# Patient Record
Sex: Female | Born: 1950 | Hispanic: No | Smoking: Never smoker
Health system: Southern US, Community
[De-identification: ages and names within clinical notes are randomized; demographics above are authoritative.]

## PROBLEM LIST (undated history)

## (undated) DIAGNOSIS — J45909 Unspecified asthma, uncomplicated: Secondary | ICD-10-CM

## (undated) DIAGNOSIS — F32A Depression, unspecified: Secondary | ICD-10-CM

## (undated) DIAGNOSIS — M5030 Other cervical disc degeneration, unspecified cervical region: Secondary | ICD-10-CM

## (undated) DIAGNOSIS — K219 Gastro-esophageal reflux disease without esophagitis: Secondary | ICD-10-CM

## (undated) DIAGNOSIS — J449 Chronic obstructive pulmonary disease, unspecified: Secondary | ICD-10-CM

## (undated) DIAGNOSIS — D472 Monoclonal gammopathy: Secondary | ICD-10-CM

## (undated) DIAGNOSIS — M3320 Polymyositis, organ involvement unspecified: Secondary | ICD-10-CM

## (undated) DIAGNOSIS — I509 Heart failure, unspecified: Secondary | ICD-10-CM

## (undated) DIAGNOSIS — I340 Nonrheumatic mitral (valve) insufficiency: Secondary | ICD-10-CM

## (undated) DIAGNOSIS — C55 Malignant neoplasm of uterus, part unspecified: Secondary | ICD-10-CM

## (undated) DIAGNOSIS — E785 Hyperlipidemia, unspecified: Secondary | ICD-10-CM

## (undated) DIAGNOSIS — C541 Malignant neoplasm of endometrium: Secondary | ICD-10-CM

## (undated) DIAGNOSIS — J984 Other disorders of lung: Secondary | ICD-10-CM

## (undated) DIAGNOSIS — J439 Emphysema, unspecified: Secondary | ICD-10-CM

## (undated) DIAGNOSIS — R001 Bradycardia, unspecified: Secondary | ICD-10-CM

## (undated) DIAGNOSIS — I272 Pulmonary hypertension, unspecified: Secondary | ICD-10-CM

## (undated) DIAGNOSIS — F329 Major depressive disorder, single episode, unspecified: Secondary | ICD-10-CM

## (undated) DIAGNOSIS — F419 Anxiety disorder, unspecified: Secondary | ICD-10-CM

## (undated) DIAGNOSIS — I10 Essential (primary) hypertension: Secondary | ICD-10-CM

## (undated) DIAGNOSIS — N189 Chronic kidney disease, unspecified: Secondary | ICD-10-CM

## (undated) HISTORY — PX: THYMECTOMY: SHX1063

## (undated) HISTORY — PX: ABDOMINAL HYSTERECTOMY: SHX81

## (undated) HISTORY — DX: Emphysema, unspecified: J43.9

## (undated) HISTORY — PX: BREAST SURGERY: SHX581

## (undated) HISTORY — DX: Anxiety disorder, unspecified: F41.9

## (undated) HISTORY — PX: EYE SURGERY: SHX253

## (undated) HISTORY — PX: CATARACT EXTRACTION: SUR2

## (undated) HISTORY — PX: DILATION AND CURETTAGE OF UTERUS: SHX78

## (undated) HISTORY — PX: APPENDECTOMY: SHX54

## (undated) HISTORY — DX: Chronic obstructive pulmonary disease, unspecified: J44.9

## (undated) HISTORY — PX: REPLACEMENT TOTAL HIP W/  RESURFACING IMPLANTS: SUR1222

## (undated) HISTORY — PX: JOINT REPLACEMENT: SHX530

## (undated) HISTORY — DX: Chronic obstructive pulmonary disease, unspecified: J98.4

---

## 1988-05-21 HISTORY — PX: BREAST EXCISIONAL BIOPSY: SUR124

## 2004-10-23 ENCOUNTER — Ambulatory Visit: Payer: Self-pay | Admitting: Internal Medicine

## 2005-10-25 ENCOUNTER — Ambulatory Visit: Payer: Self-pay | Admitting: Internal Medicine

## 2005-12-03 ENCOUNTER — Emergency Department: Payer: Self-pay | Admitting: Emergency Medicine

## 2005-12-09 ENCOUNTER — Emergency Department: Payer: Self-pay | Admitting: Emergency Medicine

## 2005-12-12 ENCOUNTER — Emergency Department: Payer: Self-pay | Admitting: Emergency Medicine

## 2005-12-16 ENCOUNTER — Emergency Department: Payer: Self-pay | Admitting: Emergency Medicine

## 2005-12-23 ENCOUNTER — Emergency Department: Payer: Self-pay | Admitting: Emergency Medicine

## 2006-01-06 ENCOUNTER — Emergency Department: Payer: Self-pay | Admitting: Emergency Medicine

## 2006-03-18 ENCOUNTER — Ambulatory Visit: Payer: Self-pay | Admitting: Gastroenterology

## 2006-10-29 ENCOUNTER — Ambulatory Visit: Payer: Self-pay | Admitting: Internal Medicine

## 2007-06-11 ENCOUNTER — Inpatient Hospital Stay: Payer: Self-pay | Admitting: Specialist

## 2007-06-11 ENCOUNTER — Other Ambulatory Visit: Payer: Self-pay

## 2007-07-09 ENCOUNTER — Ambulatory Visit: Payer: Self-pay | Admitting: Ophthalmology

## 2007-07-14 ENCOUNTER — Ambulatory Visit: Payer: Self-pay | Admitting: Ophthalmology

## 2007-09-30 ENCOUNTER — Ambulatory Visit: Payer: Self-pay | Admitting: Specialist

## 2007-11-10 ENCOUNTER — Ambulatory Visit: Payer: Self-pay | Admitting: Internal Medicine

## 2008-04-19 ENCOUNTER — Ambulatory Visit: Payer: Self-pay | Admitting: Specialist

## 2008-06-16 ENCOUNTER — Ambulatory Visit: Payer: Self-pay | Admitting: Internal Medicine

## 2008-12-06 ENCOUNTER — Ambulatory Visit: Payer: Self-pay | Admitting: Internal Medicine

## 2009-02-25 ENCOUNTER — Ambulatory Visit: Payer: Self-pay | Admitting: Specialist

## 2009-03-11 ENCOUNTER — Emergency Department: Payer: Self-pay | Admitting: Emergency Medicine

## 2009-08-29 ENCOUNTER — Emergency Department: Payer: Self-pay | Admitting: Emergency Medicine

## 2009-10-07 ENCOUNTER — Ambulatory Visit: Payer: Self-pay | Admitting: Ophthalmology

## 2009-10-24 ENCOUNTER — Ambulatory Visit: Payer: Self-pay | Admitting: Ophthalmology

## 2010-02-23 ENCOUNTER — Ambulatory Visit: Payer: Self-pay | Admitting: Internal Medicine

## 2010-03-24 ENCOUNTER — Ambulatory Visit: Payer: Self-pay | Admitting: Specialist

## 2010-04-17 ENCOUNTER — Ambulatory Visit: Payer: Self-pay | Admitting: Specialist

## 2010-05-05 ENCOUNTER — Ambulatory Visit: Payer: Self-pay | Admitting: Specialist

## 2011-03-01 ENCOUNTER — Ambulatory Visit: Payer: Self-pay | Admitting: Internal Medicine

## 2011-03-13 ENCOUNTER — Ambulatory Visit: Payer: Self-pay | Admitting: Internal Medicine

## 2011-04-16 ENCOUNTER — Ambulatory Visit: Payer: Self-pay | Admitting: Surgery

## 2011-04-16 DIAGNOSIS — I10 Essential (primary) hypertension: Secondary | ICD-10-CM

## 2011-05-19 ENCOUNTER — Ambulatory Visit: Payer: Self-pay | Admitting: Neurology

## 2011-07-23 ENCOUNTER — Ambulatory Visit: Payer: Self-pay | Admitting: Surgery

## 2011-08-08 ENCOUNTER — Emergency Department: Payer: Self-pay | Admitting: *Deleted

## 2011-09-05 ENCOUNTER — Emergency Department: Payer: Self-pay | Admitting: Emergency Medicine

## 2011-09-11 ENCOUNTER — Ambulatory Visit: Payer: Self-pay | Admitting: Gastroenterology

## 2011-09-13 ENCOUNTER — Ambulatory Visit: Payer: Self-pay | Admitting: Surgery

## 2011-09-13 LAB — PATHOLOGY REPORT

## 2011-11-17 ENCOUNTER — Emergency Department: Payer: Self-pay | Admitting: Emergency Medicine

## 2011-11-17 LAB — PROTIME-INR
INR: 1
Prothrombin Time: 13.9 secs (ref 11.5–14.7)

## 2011-11-17 LAB — BASIC METABOLIC PANEL
Anion Gap: 9 (ref 7–16)
EGFR (African American): 38 — ABNORMAL LOW
Potassium: 4.8 mmol/L (ref 3.5–5.1)
Sodium: 139 mmol/L (ref 136–145)

## 2011-11-17 LAB — CBC
HCT: 35.8 % (ref 35.0–47.0)
HGB: 12.1 g/dL (ref 12.0–16.0)
MCH: 33.8 pg (ref 26.0–34.0)
MCHC: 33.7 g/dL (ref 32.0–36.0)
MCV: 101 fL — ABNORMAL HIGH (ref 80–100)
RBC: 3.56 10*6/uL — ABNORMAL LOW (ref 3.80–5.20)
RDW: 13 % (ref 11.5–14.5)

## 2011-11-17 LAB — TROPONIN I: Troponin-I: 0.03 ng/mL

## 2011-11-22 ENCOUNTER — Emergency Department: Payer: Self-pay | Admitting: Emergency Medicine

## 2012-03-02 ENCOUNTER — Emergency Department: Payer: Self-pay | Admitting: Emergency Medicine

## 2012-03-02 LAB — BASIC METABOLIC PANEL
Anion Gap: 9 (ref 7–16)
Calcium, Total: 10 mg/dL (ref 8.5–10.1)
Chloride: 104 mmol/L (ref 98–107)
Creatinine: 1.14 mg/dL (ref 0.60–1.30)
EGFR (Non-African Amer.): 52 — ABNORMAL LOW
Osmolality: 287 (ref 275–301)
Potassium: 4.5 mmol/L (ref 3.5–5.1)

## 2012-03-02 LAB — URINALYSIS, COMPLETE
Bilirubin,UR: NEGATIVE
Blood: NEGATIVE
Ketone: NEGATIVE
Ph: 7 (ref 4.5–8.0)
Squamous Epithelial: 8
WBC UR: 14 /HPF (ref 0–5)

## 2012-03-02 LAB — CBC WITH DIFFERENTIAL/PLATELET
Basophil #: 0.1 10*3/uL (ref 0.0–0.1)
Eosinophil #: 0.2 10*3/uL (ref 0.0–0.7)
Eosinophil %: 2 %
Lymphocyte #: 2.3 10*3/uL (ref 1.0–3.6)
Lymphocyte %: 28.8 %
MCH: 33.4 pg (ref 26.0–34.0)
Monocyte #: 0.7 x10 3/mm (ref 0.2–0.9)
Neutrophil #: 4.8 10*3/uL (ref 1.4–6.5)
Neutrophil %: 59.6 %
Platelet: 205 10*3/uL (ref 150–440)
RBC: 3.86 10*6/uL (ref 3.80–5.20)
WBC: 8 10*3/uL (ref 3.6–11.0)

## 2012-03-02 LAB — SEDIMENTATION RATE: Erythrocyte Sed Rate: 46 mm/hr — ABNORMAL HIGH (ref 0–30)

## 2012-03-11 ENCOUNTER — Emergency Department: Payer: Self-pay | Admitting: Emergency Medicine

## 2012-03-11 LAB — CBC
HCT: 42.9 %
HGB: 14.2 g/dL
MCH: 33.5 pg
MCHC: 33.1 g/dL
MCV: 101 fL — ABNORMAL HIGH
Platelet: 211 10*3/uL
RBC: 4.24 X10 6/mm 3
RDW: 13.1 %
WBC: 10.9 10*3/uL

## 2012-03-11 LAB — URINALYSIS, COMPLETE
Bilirubin,UR: NEGATIVE
Blood: NEGATIVE
Glucose,UR: NEGATIVE mg/dL (ref 0–75)
Nitrite: NEGATIVE
Ph: 5 (ref 4.5–8.0)
RBC,UR: <1 /HPF (ref 0–5)
Squamous Epithelial: 9

## 2012-03-11 LAB — COMPREHENSIVE METABOLIC PANEL
Albumin: 4.2 g/dL (ref 3.4–5.0)
Anion Gap: 7 (ref 7–16)
BUN: 24 mg/dL — ABNORMAL HIGH (ref 7–18)
Calcium, Total: 9.2 mg/dL (ref 8.5–10.1)
Co2: 28 mmol/L (ref 21–32)
EGFR (African American): 53 — ABNORMAL LOW
EGFR (Non-African Amer.): 46 — ABNORMAL LOW
Glucose: 104 mg/dL — ABNORMAL HIGH (ref 65–99)
Osmolality: 284 (ref 275–301)
Potassium: 3.8 mmol/L (ref 3.5–5.1)
SGOT(AST): 35 U/L (ref 15–37)
Sodium: 140 mmol/L (ref 136–145)
Total Protein: 8.7 g/dL — ABNORMAL HIGH (ref 6.4–8.2)

## 2012-03-11 LAB — LIPASE, BLOOD: Lipase: 230 U/L

## 2012-04-10 ENCOUNTER — Ambulatory Visit: Payer: Self-pay | Admitting: Internal Medicine

## 2012-05-07 ENCOUNTER — Ambulatory Visit: Payer: Self-pay | Admitting: Internal Medicine

## 2012-12-08 ENCOUNTER — Ambulatory Visit: Payer: Self-pay | Admitting: Gastroenterology

## 2012-12-08 LAB — BASIC METABOLIC PANEL
Anion Gap: 3 — ABNORMAL LOW (ref 7–16)
BUN: 18 mg/dL (ref 7–18)
Chloride: 103 mmol/L (ref 98–107)
Creatinine: 1.15 mg/dL (ref 0.60–1.30)
EGFR (African American): 59 — ABNORMAL LOW
Glucose: 91 mg/dL (ref 65–99)
Osmolality: 279 (ref 275–301)
Potassium: 4.3 mmol/L (ref 3.5–5.1)
Sodium: 139 mmol/L (ref 136–145)

## 2012-12-08 LAB — MAGNESIUM: Magnesium: 2.2 mg/dL

## 2012-12-09 LAB — PATHOLOGY REPORT

## 2013-02-15 ENCOUNTER — Emergency Department: Payer: Self-pay | Admitting: Emergency Medicine

## 2013-06-15 ENCOUNTER — Ambulatory Visit: Payer: Self-pay | Admitting: Internal Medicine

## 2013-06-15 ENCOUNTER — Ambulatory Visit: Payer: Self-pay | Admitting: Specialist

## 2013-10-11 ENCOUNTER — Emergency Department: Payer: Self-pay | Admitting: Emergency Medicine

## 2013-10-13 ENCOUNTER — Emergency Department: Payer: Self-pay | Admitting: Emergency Medicine

## 2013-10-13 LAB — BASIC METABOLIC PANEL
ANION GAP: 5 — AB (ref 7–16)
BUN: 37 mg/dL — AB (ref 7–18)
CHLORIDE: 108 mmol/L — AB (ref 98–107)
CREATININE: 1.52 mg/dL — AB (ref 0.60–1.30)
Calcium, Total: 8.9 mg/dL (ref 8.5–10.1)
Co2: 27 mmol/L (ref 21–32)
EGFR (African American): 42 — ABNORMAL LOW
GFR CALC NON AF AMER: 36 — AB
Glucose: 107 mg/dL — ABNORMAL HIGH (ref 65–99)
OSMOLALITY: 289 (ref 275–301)
Potassium: 4.7 mmol/L (ref 3.5–5.1)
Sodium: 140 mmol/L (ref 136–145)

## 2013-10-13 LAB — CBC
HCT: 39.5 % (ref 35.0–47.0)
HGB: 12.9 g/dL (ref 12.0–16.0)
MCH: 33 pg (ref 26.0–34.0)
MCHC: 32.7 g/dL (ref 32.0–36.0)
MCV: 101 fL — ABNORMAL HIGH (ref 80–100)
Platelet: 162 10*3/uL (ref 150–440)
RBC: 3.91 10*6/uL (ref 3.80–5.20)
RDW: 13.5 % (ref 11.5–14.5)
WBC: 8.6 10*3/uL (ref 3.6–11.0)

## 2013-10-13 LAB — TROPONIN I

## 2013-10-21 DIAGNOSIS — I272 Pulmonary hypertension, unspecified: Secondary | ICD-10-CM | POA: Insufficient documentation

## 2014-01-26 ENCOUNTER — Emergency Department: Payer: Self-pay | Admitting: Emergency Medicine

## 2014-01-26 LAB — CBC
HCT: 39.7 % (ref 35.0–47.0)
HGB: 13.1 g/dL (ref 12.0–16.0)
MCH: 33.2 pg (ref 26.0–34.0)
MCHC: 33 g/dL (ref 32.0–36.0)
MCV: 101 fL — ABNORMAL HIGH (ref 80–100)
Platelet: 182 10*3/uL (ref 150–440)
RBC: 3.95 10*6/uL (ref 3.80–5.20)
RDW: 12.7 % (ref 11.5–14.5)
WBC: 7.4 10*3/uL (ref 3.6–11.0)

## 2014-01-26 LAB — BASIC METABOLIC PANEL
Anion Gap: 6 — ABNORMAL LOW (ref 7–16)
BUN: 22 mg/dL — ABNORMAL HIGH (ref 7–18)
CO2: 27 mmol/L (ref 21–32)
Calcium, Total: 9.1 mg/dL (ref 8.5–10.1)
Chloride: 107 mmol/L (ref 98–107)
Creatinine: 1.11 mg/dL (ref 0.60–1.30)
GFR CALC NON AF AMER: 53 — AB
Glucose: 85 mg/dL (ref 65–99)
OSMOLALITY: 282 (ref 275–301)
Potassium: 3.8 mmol/L (ref 3.5–5.1)
Sodium: 140 mmol/L (ref 136–145)

## 2014-01-26 LAB — TROPONIN I
TROPONIN-I: 0.03 ng/mL
Troponin-I: 0.02 ng/mL

## 2014-03-29 DIAGNOSIS — E782 Mixed hyperlipidemia: Secondary | ICD-10-CM | POA: Insufficient documentation

## 2014-03-29 DIAGNOSIS — M5030 Other cervical disc degeneration, unspecified cervical region: Secondary | ICD-10-CM | POA: Insufficient documentation

## 2014-03-29 DIAGNOSIS — J45909 Unspecified asthma, uncomplicated: Secondary | ICD-10-CM | POA: Insufficient documentation

## 2014-03-29 DIAGNOSIS — D472 Monoclonal gammopathy: Secondary | ICD-10-CM | POA: Insufficient documentation

## 2014-04-05 DIAGNOSIS — M1990 Unspecified osteoarthritis, unspecified site: Secondary | ICD-10-CM | POA: Insufficient documentation

## 2014-09-08 DIAGNOSIS — E559 Vitamin D deficiency, unspecified: Secondary | ICD-10-CM | POA: Insufficient documentation

## 2014-10-01 DIAGNOSIS — I10 Essential (primary) hypertension: Secondary | ICD-10-CM | POA: Insufficient documentation

## 2014-10-07 ENCOUNTER — Other Ambulatory Visit: Payer: Self-pay | Admitting: Internal Medicine

## 2014-10-07 DIAGNOSIS — Z1231 Encounter for screening mammogram for malignant neoplasm of breast: Secondary | ICD-10-CM

## 2014-10-08 ENCOUNTER — Ambulatory Visit: Payer: Medicare Other

## 2014-10-22 ENCOUNTER — Ambulatory Visit
Admission: RE | Admit: 2014-10-22 | Discharge: 2014-10-22 | Disposition: A | Discharge status: Home / Self Care | Payer: Medicare Other | Source: Ambulatory Visit | Attending: Internal Medicine | Admitting: Internal Medicine

## 2014-10-22 ENCOUNTER — Other Ambulatory Visit: Payer: Self-pay | Admitting: Internal Medicine

## 2014-10-22 DIAGNOSIS — Z1231 Encounter for screening mammogram for malignant neoplasm of breast: Secondary | ICD-10-CM | POA: Diagnosis present

## 2014-10-26 ENCOUNTER — Ambulatory Visit: Payer: Medicare Other

## 2015-02-21 ENCOUNTER — Ambulatory Visit: Payer: Medicare Other | Attending: Specialist

## 2015-02-21 DIAGNOSIS — E669 Obesity, unspecified: Secondary | ICD-10-CM | POA: Diagnosis present

## 2015-02-21 DIAGNOSIS — G4761 Periodic limb movement disorder: Secondary | ICD-10-CM | POA: Insufficient documentation

## 2015-02-21 DIAGNOSIS — R0683 Snoring: Secondary | ICD-10-CM | POA: Insufficient documentation

## 2015-03-21 DIAGNOSIS — Z79899 Other long term (current) drug therapy: Secondary | ICD-10-CM | POA: Insufficient documentation

## 2015-04-18 DIAGNOSIS — I5022 Chronic systolic (congestive) heart failure: Secondary | ICD-10-CM | POA: Insufficient documentation

## 2015-05-24 DIAGNOSIS — I340 Nonrheumatic mitral (valve) insufficiency: Secondary | ICD-10-CM | POA: Insufficient documentation

## 2015-09-19 DIAGNOSIS — D126 Benign neoplasm of colon, unspecified: Secondary | ICD-10-CM | POA: Insufficient documentation

## 2015-09-23 DIAGNOSIS — K219 Gastro-esophageal reflux disease without esophagitis: Secondary | ICD-10-CM | POA: Insufficient documentation

## 2015-10-28 ENCOUNTER — Encounter: Payer: Self-pay | Admitting: *Deleted

## 2015-10-28 ENCOUNTER — Emergency Department
Admission: EM | Admit: 2015-10-28 | Discharge: 2015-10-28 | Disposition: A | Discharge status: Home / Self Care | Payer: Commercial Managed Care - HMO | Attending: Emergency Medicine | Admitting: Emergency Medicine

## 2015-10-28 DIAGNOSIS — T63001A Toxic effect of unspecified snake venom, accidental (unintentional), initial encounter: Secondary | ICD-10-CM | POA: Insufficient documentation

## 2015-10-28 DIAGNOSIS — I110 Hypertensive heart disease with heart failure: Secondary | ICD-10-CM | POA: Insufficient documentation

## 2015-10-28 DIAGNOSIS — I509 Heart failure, unspecified: Secondary | ICD-10-CM | POA: Diagnosis not present

## 2015-10-28 MED ORDER — TETANUS-DIPHTH-ACELL PERTUSSIS 5-2.5-18.5 LF-MCG/0.5 IM SUSP
0.5000 mL | Freq: Once | INTRAMUSCULAR | Status: AC
Start: 2015-10-28 — End: 2015-10-28
  Administered 2015-10-28: 0.5 mL via INTRAMUSCULAR
  Filled 2015-10-28: qty 0.5

## 2015-10-28 MED ORDER — CEPHALEXIN 500 MG PO CAPS: 500.0000 mg | ORAL_CAPSULE | Freq: Four times a day (QID) | ORAL | Status: DC

## 2015-10-28 MED ORDER — CEPHALEXIN 500 MG PO CAPS
500.0000 mg | ORAL_CAPSULE | Freq: Once | ORAL | Status: AC
  Administered 2015-10-28: 500 mg via ORAL
  Filled 2015-10-28: qty 1

## 2015-10-28 NOTE — Discharge Instructions (Signed)
Snake Bite Snakes may be poisonous (venomous) or nonpoisonous (nonvenomous). A bite from a nonvenomous snake may cause a wound to the skin and possibly to the deeper tissues beneath the skin. A venomous snake will cause a wound and may also inject poison (venom) into the wound.  The effects of snake venom vary depending on the type of snake. In some cases, the effects can be extremely serious or even deadly. A bite from a venomous snake is a medical emergency. Treatment may require the use of antivenom medicine. SYMPTOMS  Symptoms of a snake bite vary depending on the type of snake, whether the snake is venomous, and the severity of the bite. Symptoms for both a venomous or nonvenomous snake may include:   Pain, redness, and swelling at the site of the bite.  Skin discoloration at the site of the bite.   A feeling of nervousness. Symptoms of a venomous snake bite may also include:   Increasing pain and swelling.  Severe anxiety or confusion.  Blood blisters or purple spots in the bite area.   Nausea and vomiting.   Numbness or tingling.   Muscle weakness.   Excessive fatigue or drowsiness.  Excessive sweating.   Difficulty breathing.   Blurred vision.   Bruising and bleeding at the site of the bite.  Feeling faint or light-headed. In some cases, symptoms do not develop until a few hours after the bite.  DIAGNOSIS  This condition may be diagnosed based on symptoms and a physical exam. Your health care provider will examine the bite area and ask for details about the snake to help determine whether it is venomous. You may also have tests, including blood tests. TREATMENT  Treatment depends on the severity of the bite and whether the snake is venomous.  Treatment for nonvenomous snake bites may involve basic wound care. This often includes cleaning the wound and applying a bandage (dressing). In some cases, antibiotic medicine or a tetanus shot may be  given.  Treatment for venomous snake bites may include antivenom medicine in addition to wound care. This medicine needs to be given as soon as possible after the bite. Other treatments may be needed to help control symptoms as they develop. You may need to stay in a hospital so your condition can be monitored. HOME CARE INSTRUCTIONS  Wound Care  Follow instructions from your health care provider about how to take care of your wound. Make sure you:   Wash your hands with soap and water before you change your dressing. If soap and water are not available, use hand sanitizer.  Change your dressing as told by your health care provider.  Keep the bite area clean and dry. Wash the bite area daily with soap and water or an antiseptic as told by your health care provider.  Check your wound every day for signs of infection. Watch for:   Redness, swelling, or pain that is getting worse.   Fluid, blood, or pus.   If you develop blistering at the site of the bite, protect the blisters from breaking. Do not attempt to open a blister. Medicines  Take or apply over-the-counter and prescription medicines only as told by your health care provider.   If you were prescribed an antibiotic, take or apply it as told by your health care provider. Do not stop using the antibiotic even if your condition improves. General Instructions  Keep the affected area raised (elevated) above the level of your heart while you are sitting  or lying down, if possible.  Keep all follow-up visits as told by your health care provider. This is important. SEEK MEDICAL CARE IF:   You have increased redness, swelling, or pain at the site of your wound.  You have fluid, blood, or pus coming from your wound.  You have a fever. SEEK IMMEDIATE MEDICAL CARE IF:   You develop blood blisters or purple spots in the bite area.   You have nausea or vomiting.   You have numbness or tingling.   You have excessive  sweating.   You have trouble breathing.   You have vision problems.   You feel very confused.  You feel faint or light-headed.    This information is not intended to replace advice given to you by your health care provider. Make sure you discuss any questions you have with your health care provider.   Document Released: 05/04/2000 Document Revised: 01/26/2015 Document Reviewed: 09/22/2014 Elsevier Interactive Patient Education [employer].

## 2015-10-28 NOTE — ED Notes (Signed)
Pt ambulatory to triage room 4.  Pt refused wheelchair.  Pt states she thinks she was bitten by a snake 4 days ago on her right leg.  2 small puncture wounds noted below right knee.  Scabbing noted to area.  No swelling noted   No redness noted.

## 2015-10-28 NOTE — ED Provider Notes (Signed)
[facility] Emergency Department Provider Note  ____________________________________________  Time seen: Approximately 5:54 PM  I have reviewed the triage vital signs and the nursing notes.   HISTORY  Chief Complaint Snake Bite    HPI Anna Romero is a 65 y.o. female presents that she believes she was potentially bit on the right lower leg by a snake in her bed. She did not see a steak, but reports that she has many snakes around and occasionally within the home. She denies any known baths within the home, and she denies any other animals in the area. She does report there are some spiders in the home, and she is not sure what bit her. The area on the leg did swell up and got red but went down yesterday and the skin now appears more normal but she reports there is a small area that looks like a bite wound to the right upper leg.  She currently reports no pain swelling nausea vomiting fevers muscle aches headache or other symptoms. She went to [employer] today, and reports that the bite looks somewhat like some of the bites seen after snakes that she was researching and thinks this may have been a snake that bit her.   No past medical history on file.  There are no active problems to display for this patient.   Past Surgical History  Procedure Laterality Date  . Breast biopsy Left 1990    negative    No current outpatient prescriptions on file.  Allergies Review of patient's allergies indicates no known allergies.  Family History  Problem Relation Age of Onset  . Adopted: Yes    Social History Social History  Substance Use Topics  . Smoking status: Never Smoker   . Smokeless tobacco: Not on file  . Alcohol Use: No    Review of Systems Constitutional: No fever/chills Eyes: No visual changes. ENT: No sore throat. Cardiovascular: Denies chest pain. Respiratory: Denies shortness of breath. Gastrointestinal: No abdominal pain.  No nausea,  no vomiting.  No diarrhea.  No constipation. Genitourinary: Negative for dysuria. Musculoskeletal: Negative for back pain. Skin: Negative for rash Except as noted. Neurological: Negative for headaches, focal weakness or numbness.  10-point ROS otherwise negative.  ____________________________________________   PHYSICAL EXAM:  VITAL SIGNS: ED Triage Vitals  Enc Vitals Group     BP 10/28/15 1729 184/56 mmHg     Pulse Rate 10/28/15 1729 66     Resp 10/28/15 1729 20     Temp 10/28/15 1729 98 F (36.7 C)     Temp Source 10/28/15 1729 Oral     SpO2 10/28/15 1729 97 %     Weight 10/28/15 1729 180 lb (81.647 kg)     Height 10/28/15 1729 5\' 6"  (1.676 m)     Head Cir --      Peak Flow --      Pain Score --      Pain Loc --      Pain Edu? --      Excl. in [place]? --    Constitutional: Alert and oriented. Well appearing and in no acute distress. Eyes: Conjunctivae are normal. PERRL. EOMI. Head: Atraumatic. Nose: No congestion/rhinnorhea. Mouth/Throat: Mucous membranes are moist.  Oropharynx non-erythematous. Neck: No stridor.   Cardiovascular: Normal rate, regular rhythm. Grossly normal heart sounds.  Good peripheral circulation. Respiratory: Normal respiratory effort.  No retractions. Lungs CTAB. Gastrointestinal: Soft and nontender. No distention. No abdominal bruits. No CVA tenderness. Musculoskeletal: No  lower extremity tenderness nor edema.  No joint effusions.Normal dorsalis pedis pulses bilateral. No evidence of rash, except for a small area with 2 very small punctate wounds involving the right upper leg with a tiny hint of erythema extending less than a centimeters around. There is no induration, fluctuance, or evidence of abscess. The leg itself shows no edema in all compartments are soft above and below the right knee. The right knee demonstrates no effusion tenderness or erythema. She is able to walk with normal gait and no pain. Neurologic:  Normal speech and language. No gross  focal neurologic deficits are appreciated. No gait instability. Skin:  Skin is warm, dry and intact. No rash noted. Psychiatric: Mood and affect are normal. Speech and behavior are normal.  ____________________________________________   LABS (all labs ordered are listed, but only abnormal results are displayed)  Labs Reviewed - No data to display ____________________________________________  EKG   ____________________________________________  RADIOLOGY   ____________________________________________   PROCEDURES  Procedure(s) performed: None  Critical Care performed: No  ____________________________________________   INITIAL IMPRESSION / ASSESSMENT AND PLAN / ED COURSE  Pertinent labs & imaging results that were available during my care of the patient were reviewed by me and considered in my medical decision making (see chart for details).  Animal bite to the right lower leg, presumptively possible from a snake given the patient's report of snakes in and around her home however it is unclear. There is evidence of a very slight erythema potentially suggesting minimal superinfection. She denies any bats in the home, and has never seen a bat in her home and this seems like a low low risk for rabies however I have contacted [facility] of Public health and reported to the rabies epidemiologist to seek advice on whether postexposure prophylaxis is needed in this particular instance. Discussed with Leda Gauze (on call epidemimiologist) and advise this is very low risk and this instance does not appear to require post-exposure prophylaxis. On further discussion, patient states never had any bats seen in or around the home, and no "bat droppings" have been seen. Doesn't sleep with door open. She does see snakes and spiders frequently in the home.  If patient does find a bat, dead bat, or other suspicion of a bat or possibily of exposure in the home she shall return to the ER  to start rabies vaccination series. Patient agreeable.  Return precautions and treatment recommendations and follow-up discussed with the patient who is agreeable with the plan.  ____________________________________________   FINAL CLINICAL IMPRESSION(S) / ED DIAGNOSES  Final diagnoses:  Snake bite, accidental or unintentional, initial encounter      Delman Kitten, MD 10/28/15 [phone]

## 2015-10-29 ENCOUNTER — Emergency Department
Admission: EM | Admit: 2015-10-29 | Discharge: 2015-10-29 | Disposition: A | Discharge status: Home / Self Care | Payer: Commercial Managed Care - HMO | Attending: Emergency Medicine | Admitting: Emergency Medicine

## 2015-10-29 ENCOUNTER — Encounter: Payer: Self-pay | Admitting: Emergency Medicine

## 2015-10-29 DIAGNOSIS — Z203 Contact with and (suspected) exposure to rabies: Secondary | ICD-10-CM

## 2015-10-29 DIAGNOSIS — I110 Hypertensive heart disease with heart failure: Secondary | ICD-10-CM | POA: Diagnosis not present

## 2015-10-29 DIAGNOSIS — W5581XA Bitten by other mammals, initial encounter: Secondary | ICD-10-CM | POA: Diagnosis not present

## 2015-10-29 DIAGNOSIS — Y999 Unspecified external cause status: Secondary | ICD-10-CM | POA: Diagnosis not present

## 2015-10-29 DIAGNOSIS — Y929 Unspecified place or not applicable: Secondary | ICD-10-CM | POA: Diagnosis not present

## 2015-10-29 DIAGNOSIS — Y939 Activity, unspecified: Secondary | ICD-10-CM | POA: Insufficient documentation

## 2015-10-29 DIAGNOSIS — S51852A Open bite of left forearm, initial encounter: Secondary | ICD-10-CM | POA: Diagnosis not present

## 2015-10-29 DIAGNOSIS — I509 Heart failure, unspecified: Secondary | ICD-10-CM | POA: Insufficient documentation

## 2015-10-29 HISTORY — DX: Essential (primary) hypertension: I10

## 2015-10-29 HISTORY — DX: Heart failure, unspecified: I50.9

## 2015-10-29 MED ORDER — RABIES IMMUNE GLOBULIN 150 UNIT/ML IM INJ
20.0000 [IU]/kg | INJECTION | Freq: Once | INTRAMUSCULAR | Status: AC
Start: 2015-10-29 — End: 2015-10-29
  Administered 2015-10-29: 1650 [IU] via INTRAMUSCULAR
  Filled 2015-10-29: qty 11

## 2015-10-29 MED ORDER — RABIES VACCINE, PCEC IM SUSR
1.0000 mL | Freq: Once | INTRAMUSCULAR | Status: AC
  Administered 2015-10-29: 1 mL via INTRAMUSCULAR
  Filled 2015-10-29: qty 1

## 2015-10-29 NOTE — ED Provider Notes (Signed)
[facility] Emergency Department Provider Note  ____________________________________________  Time seen: Approximately 8:57 AM  I have reviewed the triage vital signs and the nursing notes.   HISTORY  Chief Complaint Animal Bite    HPI Anna Romero is a 65 y.o. female returns from yesterday's visit with complaints of now she feels like she is gotten bit by a bat and wants to go through the rabies vaccination series. Complains of swelling to her left forearm dorsal area with some redness and firmness.   Past Medical History  Diagnosis Date  . Hypertension   . CHF (congestive heart failure) ([place])     There are no active problems to display for this patient.   Past Surgical History  Procedure Laterality Date  . Breast biopsy Left 1990    negative    Current Outpatient Rx  Name  Route  Sig  Dispense  Refill  . cephALEXin (KEFLEX) 500 MG capsule   Oral   Take 1 capsule (500 mg total) by mouth 4 (four) times daily.   40 capsule   0     Allergies Review of patient's allergies indicates no known allergies.  Family History  Problem Relation Age of Onset  . Adopted: Yes    Social History Social History  Substance Use Topics  . Smoking status: Never Smoker   . Smokeless tobacco: None  . Alcohol Use: No    Review of Systems Constitutional: No fever/chills Eyes: No visual changes. ENT: No sore throat. Cardiovascular: Denies chest pain. Respiratory: Denies shortness of breath. Gastrointestinal: No abdominal pain.  No nausea, no vomiting.  No diarrhea.  No constipation. Genitourinary: Negative for dysuria. Musculoskeletal: Negative for back pain. Skin: Positive for erythema redness and edema to the left anterior forearm Neurological: Negative for headaches, focal weakness or numbness.  10-point ROS otherwise negative.  ____________________________________________   PHYSICAL EXAM:  VITAL SIGNS: ED Triage Vitals  Enc Vitals  Group     BP 10/29/15 0835 155/85 mmHg     Pulse Rate 10/29/15 0835 98     Resp 10/29/15 0835 20     Temp 10/29/15 0835 97.9 F (36.6 C)     Temp Source 10/29/15 0835 Oral     SpO2 10/29/15 0835 97 %     Weight 10/29/15 0835 180 lb (81.647 kg)     Height 10/29/15 0835 5\' 6"  (1.676 m)     Head Cir --      Peak Flow --      Pain Score --      Pain Loc --      Pain Edu? --      Excl. in [place]? --     Constitutional: Alert and oriented. Well appearing and in no acute distress. Cardiovascular: Normal rate, regular rhythm. Grossly normal heart sounds.  Good peripheral circulation. Respiratory: Normal respiratory effort.  No retractions. Lungs CTAB. Musculoskeletal: No lower extremity tenderness nor edema.  No joint effusions. Neurologic:  Normal speech and language. No gross focal neurologic deficits are appreciated. No gait instability. Skin:  Skin is warm, dry and intact. No rash noted.Positive erythema and swelling to the left anterior forearm. Right leg no evidence of infection edema or swelling noted. Psychiatric: Mood and affect are normal. Speech and behavior are normal.  ____________________________________________   LABS (all labs ordered are listed, but only abnormal results are displayed)  Labs Reviewed - No data to display ____________________________________________  EKG   ____________________________________________  RADIOLOGY   ____________________________________________  PROCEDURES  Procedure(s) performed: None  Critical Care performed: No  ____________________________________________   INITIAL IMPRESSION / ASSESSMENT AND PLAN / ED COURSE  Pertinent labs & imaging results that were available during my care of the patient were reviewed by me and considered in my medical decision making (see chart for details).  Patient reports bat exposure bite. We will start rabies vaccination series within the first dosing today then again on day 3 day 7 and  14. ____________________________________________   FINAL CLINICAL IMPRESSION(S) / ED DIAGNOSES  Final diagnoses:  Rabies contact     This chart was dictated using voice recognition software/Dragon. Despite best efforts to proofread, errors can occur which can change the meaning. Any change was purely unintentional.   Arlyss Repress, PA-C 10/29/15 [place], MD 10/29/15 [phone]

## 2015-10-29 NOTE — Discharge Instructions (Signed)
Rabies Vaccine: What You Need to Know   WHAT IS RABIES?   (Return on Day #3, 7, and 14 for the vaccination series.)(Tuesday June 13, Saturday June 17 and Saturday June 24th)  Rabies is a serious disease. It is caused by a virus.  Rabies is mainly a disease of animals. Humans get rabies when they are bitten by infected animals.  At first there might not be any symptoms. But weeks, or even years after a bite, rabies can cause pain, fatigue, headaches, fever, and irritability. These are followed by seizures, hallucinations, and paralysis. Human rabies is almost always fatal.  Wild animals, especially bats, are the most common source of human rabies infection in the [place]. Skunks, raccoons, dogs, cats, coyotes, foxes, and other mammals can also transmit the disease.  Human rabies is rare in the [place]. There have been only 41 cases diagnosed since 1990. However, between 16,000 and 39,000 people are vaccinated each year as a precaution after animal bites. Also, rabies is far more common in other parts of the world, with about 40,000 to 70,000 rabies-related deaths worldwide each year. Bites from unvaccinated dogs cause most of these cases. Rabies vaccine can prevent rabies. RABIES VACCINE  Rabies vaccine is given to people at high risk of rabies to protect them if they are exposed. It can also prevent the disease if it is given to a person after they have been exposed.  Rabies vaccine is made from killed rabies virus. It cannot cause rabies. WHO SHOULD GET RABIES VACCINE AND WHEN? Preventive Vaccination (No Exposure)  People at high risk of exposure to rabies, such as veterinarians, Insurance account manager, rabies laboratory workers, spelunkers, and rabies biologics production workers should be offered rabies vaccine.  The vaccine should also be considered for:  People whose activities bring them into frequent contact with rabies virus or with possibly rabid animals.  International  travelers who are likely to come in contact with animals in parts of the world where rabies is common.  The pre-exposure schedule for rabies vaccination is 3 doses, given at the following times:  Dose 1: As appropriate.  Dose 2: 7 days after Dose 1.  Dose 3: 21 days or 28 days after Dose 1.  For laboratory workers and others who may be repeatedly exposed to rabies virus, periodic testing for immunity is recommended and booster doses should be given as needed. (Testing or booster doses are not recommended for travelers). Ask your doctor for details. Vaccination After an Exposure Anyone who has been bitten by an animal, or who otherwise may have been exposed to rabies, should clean the wound and see a doctor immediately. The doctor will determine if they need to be vaccinated. A person who is exposed and has never been vaccinated against rabies should get 4 doses of rabies vaccine: one dose right away and additional doses on the 3rd, 7th, and 14th days. They should also get another shot called Rabies Immune Globulin at the same time as the first dose.  A person who has been previously vaccinated should get 2 doses of rabies vaccine: one right away and another on the 3rd day. Rabies Immune Globulin is not needed. TELL YOUR DOCTOR IF: Talk with a doctor before getting rabies vaccine if you:  Ever had a serious (life-threatening) allergic reaction to a previous dose of rabies vaccine or to any component of the vaccine; tell your doctor if you have any severe allergies.  Have a weakened immune system because of:  HIV, AIDS, or another disease that affects the immune system.  Treatment with drugs that affect the immune system, such as steroids.  Cancer or cancer treatment with radiation or drugs. If you have a minor illness, such as a cold, you can be vaccinated. If you are moderately or severely ill, you should probably wait until you recover before getting a routine (non-exposure) dose of  rabies vaccine. If you have been exposed to rabies virus, you should get the vaccine regardless of any other illnesses you may have. WHAT ARE THE RISKS FROM RABIES VACCINE? A vaccine, like any medicine, is capable of causing serious problems, such as severe allergic reactions. The risk of a vaccine causing serious harm, or death, is extremely small. Serious problems from rabies vaccine are very rare.  Mild problems:  Soreness, redness, swelling, or itching where the shot was given (30% to 74%).  Headache, nausea, abdominal pain, muscle aches, or dizziness (5% to 40%). Moderate problems:  Hives, pain in the joints, or fever (about 6% of booster doses).  Other nervous system disorders, such as Guillain-Barr Syndrome (GBS), have been reported after rabies vaccine, but this happens so rarely that it is not known whether they are related to the vaccine. Note: Several brands of rabies vaccine are available in the [place], and reactions may vary between brands. Your provider can give you more information about a particular brand. WHAT IF THERE IS A SERIOUS REACTION? What should I look for? Look for anything that concerns you, such as signs of a severe allergic reaction, very high fever, or behavior changes.  Signs of a severe allergic reaction can include hives, swelling of the face and throat, difficulty breathing, a fast heartbeat, dizziness, and weakness. These would start a few minutes to a few hours after the vaccination. What should I do?  If you think it is a severe allergic reaction or other emergency that cannot wait, call 911 or get the person to the nearest hospital. Otherwise, call your doctor.  Afterward, the reaction should be reported to the Vaccine Adverse Event Reporting System (VAERS). Your doctor might file this report, or you can do it yourself through the VAERS website at www.vaers.SamedayNews.es or by calling (937) 006-8925. VAERS is only for reporting reactions. They do not  give medical advice. HOW CAN I LEARN MORE?  Ask your doctor.  Call your local or state health department.  Contact the Centers for Disease Control and Prevention (CDC):  Visit the CDC rabies website at [place] CDC Rabies Vaccine VIS (02/24/08)   This information is not intended to replace advice given to you by your health care provider. Make sure you discuss any questions you have with your health care provider.   Document Released: 03/04/2006 Document Revised: 09/21/2014 Document Reviewed: 08/27/2012 Elsevier Interactive Patient Education [employer].

## 2015-10-29 NOTE — ED Notes (Signed)
States was seen yesterday for possible snake bite, now thinks it may have been a bat bite.

## 2015-10-29 NOTE — ED Notes (Signed)
Pt states she was bitten in her sleep "at least 4 days ago" that woke her from her sleep.  However, pt didn't have any pain, but noticed a swollen, hard area on R lateral knee while in the shower washing a couple of days later.  Pt to ED yesterday and received "a shot" to prevent infection.  Pt to ED today, because now she is thinking "a bat could have" been what bit her. Pt also has "a bite" on her left forearm, with redness and swelling present.

## 2015-10-31 ENCOUNTER — Emergency Department
Admission: EM | Admit: 2015-10-31 | Discharge: 2015-10-31 | Disposition: A | Discharge status: Home / Self Care | Payer: Commercial Managed Care - HMO | Attending: Emergency Medicine | Admitting: Emergency Medicine

## 2015-10-31 ENCOUNTER — Encounter: Payer: Self-pay | Admitting: Emergency Medicine

## 2015-10-31 NOTE — ED Notes (Signed)
Pt is to be here on the 13th for rabies vaccine  Did not realize that it was the wrong

## 2015-10-31 NOTE — ED Notes (Signed)
Pt here for rabies vaccine.  

## 2015-11-01 ENCOUNTER — Other Ambulatory Visit: Payer: Self-pay | Admitting: Specialist

## 2015-11-01 ENCOUNTER — Emergency Department
Admission: EM | Admit: 2015-11-01 | Discharge: 2015-11-01 | Disposition: A | Discharge status: Home / Self Care | Payer: Commercial Managed Care - HMO | Attending: Emergency Medicine | Admitting: Emergency Medicine

## 2015-11-01 ENCOUNTER — Encounter: Payer: Self-pay | Admitting: Emergency Medicine

## 2015-11-01 DIAGNOSIS — R05 Cough: Secondary | ICD-10-CM

## 2015-11-01 DIAGNOSIS — I509 Heart failure, unspecified: Secondary | ICD-10-CM | POA: Diagnosis not present

## 2015-11-01 DIAGNOSIS — Z792 Long term (current) use of antibiotics: Secondary | ICD-10-CM | POA: Diagnosis not present

## 2015-11-01 DIAGNOSIS — I110 Hypertensive heart disease with heart failure: Secondary | ICD-10-CM | POA: Diagnosis not present

## 2015-11-01 DIAGNOSIS — R059 Cough, unspecified: Secondary | ICD-10-CM

## 2015-11-01 DIAGNOSIS — Z23 Encounter for immunization: Secondary | ICD-10-CM

## 2015-11-01 DIAGNOSIS — J90 Pleural effusion, not elsewhere classified: Secondary | ICD-10-CM

## 2015-11-01 MED ORDER — RABIES VACCINE, PCEC IM SUSR
1.0000 mL | Freq: Once | INTRAMUSCULAR | Status: AC
  Administered 2015-11-01: 1 mL via INTRAMUSCULAR
  Filled 2015-11-01: qty 1

## 2015-11-01 NOTE — Discharge Instructions (Signed)
Rabies °Rabies is a viral infection that can be spread to people from infected animals. The infection affects the brain and central nervous system. Once the disease develops, it almost always causes death. Because of this, when a person is bitten by an animal that may have rabies, treatment to prevent rabies often needs to be started whether or not the animal is known to be infected. Prompt treatment with the rabies vaccine and rabies immune globulin is very effective at preventing the infection from developing in people who have been exposed to the rabies virus. °CAUSES  °Rabies is caused by a virus that lives inside some animals. When a person is bitten by an infected animal, the rabies virus is spread to the person through the infected spit (saliva) of the animal. This virus can be carried by animals such as dogs, cats, skunks, bats, woodchucks, raccoons, coyotes, and foxes. °SYMPTOMS  °By the time symptoms appear, rabies is usually fatal for the person. Common symptoms include: °· Headache. °· Fever. °· Fatigue and weakness. °· Agitation. °· Anxiety. °· Confusion. °· Unusual behavior, such as hyperactivity, fear of water (hydrophobia), or fear of air (aerophobia). °· Hallucinations. °· Insomnia. °· Weakness in the arms or legs. °· Difficulty swallowing. °Most people get sick in 1-3 months after being bitten. This often varies and may depend on the location of the bite. The infection will take less time to develop if the bite occurred closer to the head.  °DIAGNOSIS  °To determine if a person is infected, several tests must be performed, such as: °· A skin biopsy. °· A saliva test. °· A lumbar puncture to remove spinal fluid so it can be examined. °· Blood tests. °TREATMENT  °Treatment to prevent the infection from developing (post-exposure prophylaxis, PEP) is often started before knowing for sure if the person has been exposed to the rabies virus. PEP involves cleaning the wound, giving an antibody injection  (rabies immune globulin), and giving a series of rabies vaccine injections. The series of injections are usually given over a two-week period. If possible, the animal that bit the person will be observed to see if it remains healthy. If the animal has been killed, it can be sent to a state laboratory and examined to see if the animal had rabies. °If a person is bitten by a domestic animal (dog, cat, or ferret) that appears healthy and can be observed to see if it remains healthy, often no further treatment is necessary other than care of the wounds caused by the animal. °Rabies is often a fatal illness once the infection develops in a person. Although a few people who developed rabies have survived after experimental treatment with certain drugs, all these survivors still had severe nervous system problems after the treatment. This is why caregivers use extra caution and begin PEP treatment for people who have been bitten by animals that are possibly infected with rabies.  °HOME CARE INSTRUCTIONS  °If you were bitten by an unknown animal, make sure you know your caregiver's instructions for follow-up. If the animal was sent to a laboratory for examination, ask when the test results will be ready. Make sure you get the test results.  °Take these steps to care for your wound: °· Keep the wound clean, dry, and dressed as directed by your caregiver. °· Keep the injured part elevated as much as possible. °· Do not resume use of the affected area until directed. °· Only take over-the-counter or prescription medicines as directed by your   caregiver. °· Keep all follow-up appointments as directed by your caregiver. °PREVENTION  °To prevent rabies, people need to reduce their risk of having contact with infected animals.  °· Make sure your pets (dogs, cats, ferrets) are vaccinated against rabies. Keep these vaccinations up-to-date as directed by your veterinarian. °· Supervise your pets when they are outside. Keep them away  from wild animals. °· Call your local animal control services to report any stray animals. These animals may not be vaccinated. °· Stay away from stray or wild animals. °· Consider getting the rabies vaccine (preexposure) if you are traveling to an area where rabies is common or if your job or activities involve possible contact with wild or stray animals. Discuss this with your caregiver. °  °This information is not intended to replace advice given to you by your health care provider. Make sure you discuss any questions you have with your health care provider. °  °Document Released: 05/07/2005 Document Revised: 05/28/2014 Document Reviewed: 12/04/2011 °Elsevier Interactive Patient Education ©2016 [employer]vier Inc[employer] ° °

## 2015-11-01 NOTE — ED Notes (Signed)
Here for second Rabies shot.  Last seen in ED three days ago and treated for snake bite.

## 2015-11-01 NOTE — ED Provider Notes (Signed)
[facility] Emergency Department Provider Note  ____________________________________________  Time seen: Approximately 1:02 PM  I have reviewed the triage vital signs and the nursing notes.   HISTORY  Chief Complaint Rabies Injection    HPI Anna Romero is a 65 y.o. female presents for evaluation of rabies shot. Patient was seen here 3 days ago for the same denies any problems complaints or concerns with injections.   Past Medical History  Diagnosis Date  . Hypertension   . CHF (congestive heart failure) ([place])     There are no active problems to display for this patient.   Past Surgical History  Procedure Laterality Date  . Breast biopsy Left 1990    negative    Current Outpatient Rx  Name  Route  Sig  Dispense  Refill  . cephALEXin (KEFLEX) 500 MG capsule   Oral   Take 1 capsule (500 mg total) by mouth 4 (four) times daily.   40 capsule   0     Allergies Review of patient's allergies indicates no known allergies.  Family History  Problem Relation Age of Onset  . Adopted: Yes    Social History Social History  Substance Use Topics  . Smoking status: Never Smoker   . Smokeless tobacco: None  . Alcohol Use: No    Review of Systems Constitutional: No fever/chills Eyes: No visual changes. ENT: No sore throat. Cardiovascular: Denies chest pain. Respiratory: Denies shortness of breath. Gastrointestinal: No abdominal pain.  No nausea, no vomiting.  No diarrhea.  No constipation. Genitourinary: Negative for dysuria. Musculoskeletal: Negative for back pain. Skin: Negative for rash. Neurological: Negative for headaches, focal weakness or numbness.  10-point ROS otherwise negative.  ____________________________________________   PHYSICAL EXAM:  VITAL SIGNS: ED Triage Vitals  Enc Vitals Group     BP 11/01/15 1249 131/69 mmHg     Pulse Rate 11/01/15 1249 60     Resp 11/01/15 1249 16     Temp 11/01/15 1249 98.1 F (36.7  C)     Temp Source 11/01/15 1249 Oral     SpO2 11/01/15 1249 98 %     Weight 11/01/15 1249 174 lb 12.8 oz (79.289 kg)     Height 11/01/15 1249 5\' 6"  (1.676 m)     Head Cir --      Peak Flow --      Pain Score 11/01/15 1252 0     Pain Loc --      Pain Edu? --      Excl. in [place]? --     Constitutional: Alert and oriented. Well appearing and in no acute distress.   Cardiovascular: Normal rate, regular rhythm. Grossly normal heart sounds.  Good peripheral circulation. Respiratory: Normal respiratory effort.  No retractions. Lungs CTAB. Musculoskeletal: No lower extremity tenderness nor edema.  No joint effusions. Neurologic:  Normal speech and language. No gross focal neurologic deficits are appreciated. No gait instability. Skin:  Skin is warm, dry and intact. No rash noted. Psychiatric: Mood and affect are normal. Speech and behavior are normal.  ____________________________________________   LABS (all labs ordered are listed, but only abnormal results are displayed)  Labs Reviewed - No data to display ____________________________________________  EKG   ____________________________________________  RADIOLOGY   ____________________________________________   PROCEDURES  Procedure(s) performed: None  Critical Care performed: No  ____________________________________________   INITIAL IMPRESSION / ASSESSMENT AND PLAN / ED COURSE  Pertinent labs & imaging results that were available during my care of the patient  were reviewed by me and considered in my medical decision making (see chart for details).  Rabies vaccination given. Patient to return to the ED or urgent care in one week for continued series. ____________________________________________   FINAL CLINICAL IMPRESSION(S) / ED DIAGNOSES  Final diagnoses:  Need for rabies vaccination     This chart was dictated using voice recognition software/Dragon. Despite best efforts to proofread, errors can occur  which can change the meaning. Any change was purely unintentional.   Arlyss Repress, PA-C 11/01/15 1334  Eula Listen, MD 11/01/15 1424

## 2015-11-05 ENCOUNTER — Emergency Department
Admission: EM | Admit: 2015-11-05 | Discharge: 2015-11-05 | Disposition: A | Discharge status: Home / Self Care | Payer: Commercial Managed Care - HMO | Attending: Emergency Medicine | Admitting: Emergency Medicine

## 2015-11-05 DIAGNOSIS — I509 Heart failure, unspecified: Secondary | ICD-10-CM | POA: Insufficient documentation

## 2015-11-05 DIAGNOSIS — I110 Hypertensive heart disease with heart failure: Secondary | ICD-10-CM | POA: Diagnosis not present

## 2015-11-05 DIAGNOSIS — Z23 Encounter for immunization: Secondary | ICD-10-CM

## 2015-11-05 MED ORDER — RABIES VACCINE, PCEC IM SUSR
1.0000 mL | Freq: Once | INTRAMUSCULAR | Status: AC
  Administered 2015-11-05: 1 mL via INTRAMUSCULAR
  Filled 2015-11-05: qty 1

## 2015-11-05 NOTE — ED Notes (Signed)
Pt reports she is here for her 3rd rabies injection.

## 2015-11-05 NOTE — ED Provider Notes (Signed)
[facility] Emergency Department Provider Note  ____________________________________________  Time seen: Approximately 7:10 AM  I have reviewed the triage vital signs and the nursing notes.   HISTORY  Chief Complaint Rabies Injection    HPI Anna Romero is a 65 y.o. female , NAD, presents to the emergency department for Day 7 rabies vaccination. She has had no side effects to previous vaccinations and is doing well today. She has no additional complaints to be addressed today.   Past Medical History  Diagnosis Date  . Hypertension   . CHF (congestive heart failure) ([place])     There are no active problems to display for this patient.   Past Surgical History  Procedure Laterality Date  . Breast biopsy Left 1990    negative    Current Outpatient Rx  Name  Route  Sig  Dispense  Refill  . cephALEXin (KEFLEX) 500 MG capsule   Oral   Take 1 capsule (500 mg total) by mouth 4 (four) times daily.   40 capsule   0     Allergies Review of patient's allergies indicates no known allergies.  Family History  Problem Relation Age of Onset  . Adopted: Yes    Social History Social History  Substance Use Topics  . Smoking status: Never Smoker   . Smokeless tobacco: Not on file  . Alcohol Use: No     Review of Systems  Constitutional: No fever/chills Cardiovascular: No chest pain. Respiratory: No shortness of breath. No wheezing.  Gastrointestinal: No abdominal pain.  No nausea, vomiting.   Skin: Negative for rash.  ____________________________________________   PHYSICAL EXAM:  VITAL SIGNS: ED Triage Vitals  Enc Vitals Group     BP 11/05/15 0659 123/79 mmHg     Pulse Rate 11/05/15 0659 60     Resp 11/05/15 0659 18     Temp 11/05/15 0659 98.5 F (36.9 C)     Temp Source 11/05/15 0659 Oral     SpO2 11/05/15 0659 99 %     Weight 11/05/15 0659 187 lb (84.823 kg)     Height 11/05/15 0659 5\' 6"  (1.676 m)     Head Cir --      Peak  Flow --      Pain Score 11/05/15 0658 0     Pain Loc --      Pain Edu? --      Excl. in [place]? --      Constitutional: Alert and oriented. Well appearing and in no acute distress. Eyes: Conjunctivae are normal.  Head: Atraumatic. Respiratory: Normal respiratory effort without tachypnea or retractions. Neurologic:  Normal speech and language. No gross focal neurologic deficits are appreciated. Gait and posture are normal Skin:  Skin is warm, dry and intact. No rash noted. Psychiatric: Mood and affect are normal. Speech and behavior are normal. Patient exhibits appropriate insight and judgement.   ____________________________________________   LABS  None ____________________________________________  EKG  None ____________________________________________  RADIOLOGY  None ____________________________________________    PROCEDURES  Procedure(s) performed: None    Medications  rabies vaccine (RABAVERT) injection 1 mL (1 mL Intramuscular Given 11/05/15 0706)     ____________________________________________   INITIAL IMPRESSION / ASSESSMENT AND PLAN / ED COURSE  Patient's diagnosis is consistent with need for rabies vaccination. Patient tolerated rabies vaccination in the emergency department without any side effects. Patient is to follow up with [place] urgent care in 1 week for final rabies vaccination. Patient is given ED precautions to return  to the ED for any worsening or new symptoms.   ____________________________________________  FINAL CLINICAL IMPRESSION(S) / ED DIAGNOSES  Final diagnoses:  Need for rabies vaccination      NEW MEDICATIONS STARTED DURING THIS VISIT:  New Prescriptions   No medications on file         Braxton Feathers, PA-C 11/05/15 0719  Delman Kitten, MD 11/05/15 1526

## 2015-11-05 NOTE — Discharge Instructions (Signed)
Rabies Vaccine: What You Need to Know  WHAT IS RABIES?  · Rabies is a serious disease. It is caused by a virus.  · Rabies is mainly a disease of animals. Humans get rabies when they are bitten by infected animals.  · At first there might not be any symptoms. But weeks, or even years after a bite, rabies can cause pain, fatigue, headaches, fever, and irritability. These are followed by seizures, hallucinations, and paralysis. Human rabies is almost always fatal.  · Wild animals, especially bats, are the most common source of human rabies infection in the [place]. Skunks, raccoons, dogs, cats, coyotes, foxes, and other mammals can also transmit the disease.  · Human rabies is rare in the [place]. There have been only 55 cases diagnosed since 1990. However, between 16,000 and 39,000 people are vaccinated each year as a precaution after animal bites. Also, rabies is far more common in other parts of the world, with about 40,000 to 70,000 rabies-related deaths worldwide each year. Bites from unvaccinated dogs cause most of these cases.  Rabies vaccine can prevent rabies.  RABIES VACCINE  · Rabies vaccine is given to people at high risk of rabies to protect them if they are exposed. It can also prevent the disease if it is given to a person after they have been exposed.  · Rabies vaccine is made from killed rabies virus. It cannot cause rabies.  WHO SHOULD GET RABIES VACCINE AND WHEN?  Preventive Vaccination (No Exposure)  · People at high risk of exposure to rabies, such as veterinarians, animal handlers, rabies laboratory workers, spelunkers, and rabies biologics production workers should be offered rabies vaccine.  · The vaccine should also be considered for:    People whose activities bring them into frequent contact with rabies virus or with possibly rabid animals.    International travelers who are likely to come in contact with animals in parts of the world where rabies is common.  · The pre-exposure  schedule for rabies vaccination is 3 doses, given at the following times:    Dose 1: As appropriate.    Dose 2: 7 days after Dose 1.    Dose 3: 21 days or 28 days after Dose 1.  · For laboratory workers and others who may be repeatedly exposed to rabies virus, periodic testing for immunity is recommended and booster doses should be given as needed. (Testing or booster doses are not recommended for travelers). Ask your doctor for details.  Vaccination After an Exposure  Anyone who has been bitten by an animal, or who otherwise may have been exposed to rabies, should clean the wound and see a doctor immediately. The doctor will determine if they need to be vaccinated.  A person who is exposed and has never been vaccinated against rabies should get 4 doses of rabies vaccine: one dose right away and additional doses on the 3rd, 7th, and 14th days. They should also get another shot called Rabies Immune Globulin at the same time as the first dose.   A person who has been previously vaccinated should get 2 doses of rabies vaccine: one right away and another on the 3rd day. Rabies Immune Globulin is not needed.  TELL YOUR DOCTOR IF:  Talk with a doctor before getting rabies vaccine if you:  · Ever had a serious (life-threatening) allergic reaction to a previous dose of rabies vaccine or to any component of the vaccine; tell your doctor if you have any severe allergies.  ·   Have a weakened immune system because of:    HIV, AIDS, or another disease that affects the immune system.    Treatment with drugs that affect the immune system, such as steroids.    Cancer or cancer treatment with radiation or drugs.  If you have a minor illness, such as a cold, you can be vaccinated. If you are moderately or severely ill, you should probably wait until you recover before getting a routine (non-exposure) dose of rabies vaccine.  If you have been exposed to rabies virus, you should get the vaccine regardless of any other illnesses you may  have.  WHAT ARE THE RISKS FROM RABIES VACCINE?  A vaccine, like any medicine, is capable of causing serious problems, such as severe allergic reactions. The risk of a vaccine causing serious harm, or death, is extremely small. Serious problems from rabies vaccine are very rare.   Mild problems:  · Soreness, redness, swelling, or itching where the shot was given (30% to 74%).  · Headache, nausea, abdominal pain, muscle aches, or dizziness (5% to 40%).  Moderate problems:  · Hives, pain in the joints, or fever (about 6% of booster doses).  · Other nervous system disorders, such as Guillain-Barré Syndrome (GBS), have been reported after rabies vaccine, but this happens so rarely that it is not known whether they are related to the vaccine.  Note: Several brands of rabies vaccine are available in the [place], and reactions may vary between brands. Your provider can give you more information about a particular brand.  WHAT IF THERE IS A SERIOUS REACTION?  What should I look for?  Look for anything that concerns you, such as signs of a severe allergic reaction, very high fever, or behavior changes.   Signs of a severe allergic reaction can include hives, swelling of the face and throat, difficulty breathing, a fast heartbeat, dizziness, and weakness. These would start a few minutes to a few hours after the vaccination.  What should I do?  · If you think it is a severe allergic reaction or other emergency that cannot wait, call 911 or get the person to the nearest hospital. Otherwise, call your doctor.  · Afterward, the reaction should be reported to the Vaccine Adverse Event Reporting System (VAERS). Your doctor might file this report, or you can do it yourself through the VAERS website at www.vaers.hhs.gov or by calling 1-800-822-7967.  VAERS is only for reporting reactions. They do not give medical advice.  HOW CAN I LEARN MORE?  · Ask your doctor.  · Call your local or state health department.  · Contact the  Centers for Disease Control and Prevention (CDC):    Visit the CDC rabies website at www.cdc.gov/rabies/  CDC Rabies Vaccine VIS (02/24/08)     This information is not intended to replace advice given to you by your health care provider. Make sure you discuss any questions you have with your health care provider.     Document Released: 03/04/2006 Document Revised: 09/21/2014 Document Reviewed: 08/27/2012  Elsevier Interactive Patient Education ©2016 Elsevier Inc.

## 2015-11-12 ENCOUNTER — Emergency Department
Admission: EM | Admit: 2015-11-12 | Discharge: 2015-11-12 | Disposition: A | Discharge status: Home / Self Care | Payer: Commercial Managed Care - HMO | Attending: Emergency Medicine | Admitting: Emergency Medicine

## 2015-11-12 DIAGNOSIS — I110 Hypertensive heart disease with heart failure: Secondary | ICD-10-CM | POA: Diagnosis not present

## 2015-11-12 DIAGNOSIS — I509 Heart failure, unspecified: Secondary | ICD-10-CM | POA: Insufficient documentation

## 2015-11-12 DIAGNOSIS — Z23 Encounter for immunization: Secondary | ICD-10-CM | POA: Diagnosis not present

## 2015-11-12 MED ORDER — RABIES VACCINE, PCEC IM SUSR
1.0000 mL | Freq: Once | INTRAMUSCULAR | Status: AC
  Administered 2015-11-12: 1 mL via INTRAMUSCULAR
  Filled 2015-11-12: qty 1

## 2015-11-12 NOTE — ED Provider Notes (Signed)
[facility] Emergency Department Provider Note  ____________________________________________  Time seen: Approximately 7:13 AM  I have reviewed the triage vital signs and the nursing notes.   HISTORY  Chief Complaint Rabies Injection    HPI Anna Romero is a 65 y.o. female , NAD, presents to the emergency department for day 14 rabies vaccination. She has had no side effects to previous vaccination is doing well today. She has no additional complaints to be addressed today.   Past Medical History  Diagnosis Date  . Hypertension   . CHF (congestive heart failure) ([place])     There are no active problems to display for this patient.   Past Surgical History  Procedure Laterality Date  . Breast biopsy Left 1990    negative    Current Outpatient Rx  Name  Route  Sig  Dispense  Refill  . cephALEXin (KEFLEX) 500 MG capsule   Oral   Take 1 capsule (500 mg total) by mouth 4 (four) times daily.   40 capsule   0     Allergies Review of patient's allergies indicates no known allergies.  Family History  Problem Relation Age of Onset  . Adopted: Yes    Social History Social History  Substance Use Topics  . Smoking status: Never Smoker   . Smokeless tobacco: Not on file  . Alcohol Use: No     Review of Systems  Constitutional: No fever/chills Cardiovascular: No chest pain. Respiratory: No shortness of breath. No wheezing.  Gastrointestinal: No abdominal pain.  No nausea, vomiting.   Skin: Negative for rash.   ____________________________________________   PHYSICAL EXAM:  VITAL SIGNS: ED Triage Vitals  Enc Vitals Group     BP --      Pulse --      Resp --      Temp --      Temp src --      SpO2 --      Weight 11/12/15 0634 171 lb 8 oz (77.792 kg)     Height 11/12/15 0634 5\' 6"  (1.676 m)     Head Cir --      Peak Flow --      Pain Score --      Pain Loc --      Pain Edu? --      Excl. in [place]? --      Constitutional:  Alert and oriented. Well appearing and in no acute distress. Eyes: Conjunctivae are normal.  Head: Atraumatic. Respiratory: Normal respiratory effort without tachypnea or retractions. Neurologic:  Normal speech and language. No gross focal neurologic deficits are appreciated.  Skin:  Skin is warm, dry and intact. No rash noted. Psychiatric: Mood and affect are normal. Speech and behavior are normal. Patient exhibits appropriate insight and judgement.   ____________________________________________   LABS  None ____________________________________________  EKG  None ____________________________________________  RADIOLOGY  None ____________________________________________    PROCEDURES  Procedure(s) performed: None    Medications  rabies vaccine (RABAVERT) injection 1 mL (1 mL Intramuscular Given 11/12/15 0726)     ____________________________________________   INITIAL IMPRESSION / ASSESSMENT AND PLAN / ED COURSE  Patient's diagnosis is consistent with need for rabies vaccination. Patient tolerated rabies vaccination in the emergency department without any side effects. This concludes the patient's postexposure prophylaxis treatment for rabies. She is to follow up with her primary care provider as needed. Patient is given ED precautions to return to the ED for any worsening or new symptoms.  ____________________________________________  FINAL CLINICAL IMPRESSION(S) / ED DIAGNOSES  Final diagnoses:  Need for rabies vaccination      NEW MEDICATIONS STARTED DURING THIS VISIT:  New Prescriptions   No medications on file         Braxton Feathers, PA-C 11/12/15 [place], MD 11/12/15 [phone]

## 2015-11-12 NOTE — Discharge Instructions (Signed)
Rabies °Rabies is a viral infection that can be spread to people from infected animals. The infection affects the brain and central nervous system. Once the disease develops, it almost always causes death. Because of this, when a person is bitten by an animal that may have rabies, treatment to prevent rabies often needs to be started whether or not the animal is known to be infected. Prompt treatment with the rabies vaccine and rabies immune globulin is very effective at preventing the infection from developing in people who have been exposed to the rabies virus. °CAUSES  °Rabies is caused by a virus that lives inside some animals. When a person is bitten by an infected animal, the rabies virus is spread to the person through the infected spit (saliva) of the animal. This virus can be carried by animals such as dogs, cats, skunks, bats, woodchucks, raccoons, coyotes, and foxes. °SYMPTOMS  °By the time symptoms appear, rabies is usually fatal for the person. Common symptoms include: °· Headache. °· Fever. °· Fatigue and weakness. °· Agitation. °· Anxiety. °· Confusion. °· Unusual behavior, such as hyperactivity, fear of water (hydrophobia), or fear of air (aerophobia). °· Hallucinations. °· Insomnia. °· Weakness in the arms or legs. °· Difficulty swallowing. °Most people get sick in 1-3 months after being bitten. This often varies and may depend on the location of the bite. The infection will take less time to develop if the bite occurred closer to the head.  °DIAGNOSIS  °To determine if a person is infected, several tests must be performed, such as: °· A skin biopsy. °· A saliva test. °· A lumbar puncture to remove spinal fluid so it can be examined. °· Blood tests. °TREATMENT  °Treatment to prevent the infection from developing (post-exposure prophylaxis, PEP) is often started before knowing for sure if the person has been exposed to the rabies virus. PEP involves cleaning the wound, giving an antibody injection  (rabies immune globulin), and giving a series of rabies vaccine injections. The series of injections are usually given over a two-week period. If possible, the animal that bit the person will be observed to see if it remains healthy. If the animal has been killed, it can be sent to a state laboratory and examined to see if the animal had rabies. °If a person is bitten by a domestic animal (dog, cat, or ferret) that appears healthy and can be observed to see if it remains healthy, often no further treatment is necessary other than care of the wounds caused by the animal. °Rabies is often a fatal illness once the infection develops in a person. Although a few people who developed rabies have survived after experimental treatment with certain drugs, all these survivors still had severe nervous system problems after the treatment. This is why caregivers use extra caution and begin PEP treatment for people who have been bitten by animals that are possibly infected with rabies.  °HOME CARE INSTRUCTIONS  °If you were bitten by an unknown animal, make sure you know your caregiver's instructions for follow-up. If the animal was sent to a laboratory for examination, ask when the test results will be ready. Make sure you get the test results.  °Take these steps to care for your wound: °· Keep the wound clean, dry, and dressed as directed by your caregiver. °· Keep the injured part elevated as much as possible. °· Do not resume use of the affected area until directed. °· Only take over-the-counter or prescription medicines as directed by your   caregiver. °· Keep all follow-up appointments as directed by your caregiver. °PREVENTION  °To prevent rabies, people need to reduce their risk of having contact with infected animals.  °· Make sure your pets (dogs, cats, ferrets) are vaccinated against rabies. Keep these vaccinations up-to-date as directed by your veterinarian. °· Supervise your pets when they are outside. Keep them away  from wild animals. °· Call your local animal control services to report any stray animals. These animals may not be vaccinated. °· Stay away from stray or wild animals. °· Consider getting the rabies vaccine (preexposure) if you are traveling to an area where rabies is common or if your job or activities involve possible contact with wild or stray animals. Discuss this with your caregiver. °  °This information is not intended to replace advice given to you by your health care provider. Make sure you discuss any questions you have with your health care provider. °  °Document Released: 05/07/2005 Document Revised: 05/28/2014 Document Reviewed: 12/04/2011 °Elsevier Interactive Patient Education ©2016 [employer]vier Inc[employer] ° °

## 2015-11-12 NOTE — ED Notes (Signed)
Patient here for final rabies injection.

## 2015-11-12 NOTE — ED Notes (Signed)
Pt here for rabies vaccine. Reports this as her last vaccine.  She reports being bitten by a snake or maybe a bat.  Pt denies any adverse reactions to previous vaccines.  Pt reports feeling well.

## 2015-11-21 ENCOUNTER — Ambulatory Visit: Payer: Commercial Managed Care - HMO

## 2015-11-23 ENCOUNTER — Other Ambulatory Visit: Payer: Self-pay | Admitting: Internal Medicine

## 2015-11-23 DIAGNOSIS — Z1231 Encounter for screening mammogram for malignant neoplasm of breast: Secondary | ICD-10-CM

## 2015-12-02 ENCOUNTER — Ambulatory Visit
Admission: RE | Admit: 2015-12-02 | Discharge: 2015-12-02 | Disposition: A | Discharge status: Home / Self Care | Payer: Commercial Managed Care - HMO | Source: Ambulatory Visit | Attending: Specialist | Admitting: Specialist

## 2015-12-02 DIAGNOSIS — R05 Cough: Secondary | ICD-10-CM | POA: Insufficient documentation

## 2015-12-02 DIAGNOSIS — R918 Other nonspecific abnormal finding of lung field: Secondary | ICD-10-CM | POA: Diagnosis not present

## 2015-12-02 DIAGNOSIS — J90 Pleural effusion, not elsewhere classified: Secondary | ICD-10-CM | POA: Diagnosis present

## 2015-12-02 DIAGNOSIS — R059 Cough, unspecified: Secondary | ICD-10-CM

## 2015-12-08 ENCOUNTER — Ambulatory Visit: Payer: Medicare Other

## 2016-03-26 DIAGNOSIS — R4189 Other symptoms and signs involving cognitive functions and awareness: Secondary | ICD-10-CM | POA: Insufficient documentation

## 2016-03-31 DIAGNOSIS — F5104 Psychophysiologic insomnia: Secondary | ICD-10-CM | POA: Insufficient documentation

## 2016-03-31 DIAGNOSIS — F330 Major depressive disorder, recurrent, mild: Secondary | ICD-10-CM | POA: Insufficient documentation

## 2016-04-04 DIAGNOSIS — R001 Bradycardia, unspecified: Secondary | ICD-10-CM | POA: Insufficient documentation

## 2016-04-06 ENCOUNTER — Encounter: Payer: Self-pay | Admitting: *Deleted

## 2016-04-09 ENCOUNTER — Encounter: Admission: RE | Payer: Self-pay | Source: Ambulatory Visit

## 2016-04-09 ENCOUNTER — Ambulatory Visit
Admission: RE | Admit: 2016-04-09 | Payer: Commercial Managed Care - HMO | Source: Ambulatory Visit | Admitting: Gastroenterology

## 2016-04-09 HISTORY — DX: Pulmonary hypertension, unspecified: I27.20

## 2016-04-09 HISTORY — DX: Monoclonal gammopathy: D47.2

## 2016-04-09 HISTORY — DX: Nonrheumatic mitral (valve) insufficiency: I34.0

## 2016-04-09 HISTORY — DX: Hyperlipidemia, unspecified: E78.5

## 2016-04-09 HISTORY — DX: Major depressive disorder, single episode, unspecified: F32.9

## 2016-04-09 HISTORY — DX: Unspecified asthma, uncomplicated: J45.909

## 2016-04-09 HISTORY — DX: Polymyositis, organ involvement unspecified: M33.20

## 2016-04-09 HISTORY — DX: Chronic kidney disease, unspecified: N18.9

## 2016-04-09 HISTORY — DX: Gastro-esophageal reflux disease without esophagitis: K21.9

## 2016-04-09 HISTORY — DX: Other cervical disc degeneration, unspecified cervical region: M50.30

## 2016-04-09 HISTORY — DX: Depression, unspecified: F32.A

## 2016-04-09 SURGERY — COLONOSCOPY
Anesthesia: General

## 2016-08-24 ENCOUNTER — Encounter: Payer: Self-pay | Admitting: *Deleted

## 2016-08-27 ENCOUNTER — Ambulatory Visit: Payer: Medicare HMO | Admitting: Anesthesiology

## 2016-08-27 ENCOUNTER — Encounter
Admission: RE | Disposition: A | Discharge status: Home / Self Care | Payer: Self-pay | Source: Ambulatory Visit | Attending: Gastroenterology

## 2016-08-27 ENCOUNTER — Ambulatory Visit
Admission: RE | Admit: 2016-08-27 | Discharge: 2016-08-27 | Disposition: A | Discharge status: Home / Self Care | Payer: Medicare HMO | Source: Ambulatory Visit | Attending: Gastroenterology | Admitting: Gastroenterology

## 2016-08-27 ENCOUNTER — Encounter: Payer: Self-pay | Admitting: *Deleted

## 2016-08-27 DIAGNOSIS — J45909 Unspecified asthma, uncomplicated: Secondary | ICD-10-CM | POA: Insufficient documentation

## 2016-08-27 DIAGNOSIS — I509 Heart failure, unspecified: Secondary | ICD-10-CM | POA: Diagnosis not present

## 2016-08-27 DIAGNOSIS — K219 Gastro-esophageal reflux disease without esophagitis: Secondary | ICD-10-CM | POA: Diagnosis not present

## 2016-08-27 DIAGNOSIS — K5730 Diverticulosis of large intestine without perforation or abscess without bleeding: Secondary | ICD-10-CM | POA: Insufficient documentation

## 2016-08-27 DIAGNOSIS — Z86010 Personal history of colonic polyps: Secondary | ICD-10-CM | POA: Diagnosis present

## 2016-08-27 DIAGNOSIS — N183 Chronic kidney disease, stage 3 (moderate): Secondary | ICD-10-CM | POA: Insufficient documentation

## 2016-08-27 DIAGNOSIS — M1990 Unspecified osteoarthritis, unspecified site: Secondary | ICD-10-CM | POA: Insufficient documentation

## 2016-08-27 DIAGNOSIS — F329 Major depressive disorder, single episode, unspecified: Secondary | ICD-10-CM | POA: Diagnosis not present

## 2016-08-27 DIAGNOSIS — Z7951 Long term (current) use of inhaled steroids: Secondary | ICD-10-CM | POA: Diagnosis not present

## 2016-08-27 DIAGNOSIS — K648 Other hemorrhoids: Secondary | ICD-10-CM | POA: Insufficient documentation

## 2016-08-27 DIAGNOSIS — I130 Hypertensive heart and chronic kidney disease with heart failure and stage 1 through stage 4 chronic kidney disease, or unspecified chronic kidney disease: Secondary | ICD-10-CM | POA: Insufficient documentation

## 2016-08-27 DIAGNOSIS — D122 Benign neoplasm of ascending colon: Secondary | ICD-10-CM | POA: Diagnosis not present

## 2016-08-27 DIAGNOSIS — G4730 Sleep apnea, unspecified: Secondary | ICD-10-CM | POA: Diagnosis not present

## 2016-08-27 DIAGNOSIS — M5030 Other cervical disc degeneration, unspecified cervical region: Secondary | ICD-10-CM | POA: Diagnosis present

## 2016-08-27 DIAGNOSIS — I340 Nonrheumatic mitral (valve) insufficiency: Secondary | ICD-10-CM | POA: Diagnosis not present

## 2016-08-27 DIAGNOSIS — Z79899 Other long term (current) drug therapy: Secondary | ICD-10-CM | POA: Insufficient documentation

## 2016-08-27 DIAGNOSIS — E785 Hyperlipidemia, unspecified: Secondary | ICD-10-CM | POA: Diagnosis not present

## 2016-08-27 HISTORY — PX: COLONOSCOPY: SHX5424

## 2016-08-27 SURGERY — COLONOSCOPY
Anesthesia: General

## 2016-08-27 MED ORDER — LIDOCAINE HCL (PF) 2 % IJ SOLN
INTRAMUSCULAR | Status: AC
  Filled 2016-08-27: qty 2

## 2016-08-27 MED ORDER — SODIUM CHLORIDE 0.9 % IV SOLN
INTRAVENOUS | Status: DC
  Administered 2016-08-27: 12:00:00 via INTRAVENOUS

## 2016-08-27 MED ORDER — LIDOCAINE HCL (CARDIAC) 20 MG/ML IV SOLN
INTRAVENOUS | Status: DC | PRN
  Administered 2016-08-27: 60 mg via INTRAVENOUS

## 2016-08-27 MED ORDER — SODIUM CHLORIDE 0.9 % IV SOLN
INTRAVENOUS | Status: DC
  Administered 2016-08-27: 11:00:00 via INTRAVENOUS

## 2016-08-27 MED ORDER — PROPOFOL 10 MG/ML IV BOLUS
INTRAVENOUS | Status: DC | PRN
  Administered 2016-08-27: 50 mg via INTRAVENOUS

## 2016-08-27 MED ORDER — PROPOFOL 500 MG/50ML IV EMUL
INTRAVENOUS | Status: DC | PRN
  Administered 2016-08-27: 150 ug/kg/min via INTRAVENOUS

## 2016-08-27 MED ORDER — PROPOFOL 500 MG/50ML IV EMUL
INTRAVENOUS | Status: AC
  Filled 2016-08-27: qty 50

## 2016-08-27 MED ORDER — PROPOFOL 10 MG/ML IV BOLUS
INTRAVENOUS | Status: AC
  Filled 2016-08-27: qty 20

## 2016-08-27 NOTE — Op Note (Signed)
[facility] Gastroenterology Patient Name: Anna Romero Procedure Date: 08/27/2016 11:33 AM MRN: [id number] Account #: [id number] Date of Birth: 09/04/1950 Admit Type: Outpatient Age: 66 Room: [facility] ENDO ROOM 3 Gender: Female Note Status: Finalized Procedure:            Colonoscopy Indications:          Personal history of colonic polyps Providers:            Lollie Sails, MD Referring MD:         Christena Flake. Raechel Ache, MD (Referring MD) Medicines:            Monitored Anesthesia Care Complications:        No immediate complications. Procedure:            Pre-Anesthesia Assessment:                       - ASA Grade Assessment: III - A patient with severe                        systemic disease.                       After obtaining informed consent, the colonoscope was                        passed under direct vision. Throughout the procedure,                        the patient's blood pressure, pulse, and oxygen                        saturations were monitored continuously. The                        Colonoscope was introduced through the anus and                        advanced to the the cecum, identified by appendiceal                        orifice and ileocecal valve. The colonoscopy was                        unusually difficult due to significant looping and a                        tortuous colon. Successful completion of the procedure                        was aided by changing the patient to a supine position                        and changing the patient to a prone position. The                        patient tolerated the procedure well. The quality of                        the bowel preparation was good. Findings:      A 28 mm polyp was found  in the distal ascending colon at a previously       placed tattoo. The polyp was carpet-like. The polyp was removed with a       cold biopsy forceps. The polyp was removed with a piecemeal technique   using a cold biopsy forceps. The polyp was removed with a saline       injection-lift technique using a cold snare. The polyp was removed with       a piecemeal technique using a cold snare. Resection and retrieval were       complete. To prevent bleeding after the polypectomy, one hemostatic clip       was successfully placed (MR conditional). There was no bleeding at the       end of the maneuver.      The digital rectal exam was normal.      Non-bleeding internal hemorrhoids were found during anoscopy. The       hemorrhoids were small and Grade I (internal hemorrhoids that do not       prolapse).      A few small-mouthed diverticula were found in the sigmoid colon and       distal descending colon. Impression:           - One 28 mm polyp in the distal ascending colon,                        removed using injection-lift and a cold snare, removed                        piecemeal using a cold snare, removed with a cold                        biopsy forceps and removed piecemeal using a cold                        biopsy forceps. Resected and retrieved. Clip (MR                        conditional) was placed.                       - Non-bleeding internal hemorrhoids.                       - Diverticulosis in the sigmoid colon and in the distal                        descending colon. Recommendation:       - Await pathology results.                       - Telephone GI clinic for pathology results in 1 week.                       - Clear liquid diet today.                       - Full liquid diet for 1 day, then advance as tolerated                        to low residue diet for 3 days. Procedure Code(s):    ---  Professional ---                       [phone], Colonoscopy, flexible; with removal of tumor(s),                        polyp(s), or other lesion(s) by snare technique                       [id number], Colonoscopy, flexible; with directed submucosal                        injection(s),  any substance Diagnosis Code(s):    --- Professional ---                       D12.2, Benign neoplasm of ascending colon                       K64.0, First degree hemorrhoids                       Z86.010, Personal history of colonic polyps                       K57.30, Diverticulosis of large intestine without                        perforation or abscess without bleeding CPT copyright 2016 American Medical Association. All rights reserved. The codes documented in this report are preliminary and upon coder review may  be revised to meet current compliance requirements. Lollie Sails, MD 08/27/2016 12:47:46 PM This report has been signed electronically. Number of Addenda: 0 Note Initiated On: 08/27/2016 11:33 AM Scope Withdrawal Time: 0 hours 40 minutes 45 seconds  Total Procedure Duration: 0 hours 49 minutes 34 seconds       [facility]

## 2016-08-27 NOTE — Anesthesia Postprocedure Evaluation (Signed)
Anesthesia Post Note  Patient: Anna Romero  Procedure(s) Performed: Procedure(s) (LRB): COLONOSCOPY (N/A)  Patient location during evaluation: Endoscopy Anesthesia Type: General Level of consciousness: awake and alert and oriented Pain management: pain level controlled Vital Signs Assessment: post-procedure vital signs reviewed and stable Respiratory status: spontaneous breathing, nonlabored ventilation and respiratory function stable Cardiovascular status: blood pressure returned to baseline and stable Postop Assessment: no signs of nausea or vomiting Anesthetic complications: no     Last Vitals:  Vitals:   08/27/16 1314 08/27/16 1324  BP:    Pulse: (!) 39 (!) 32  Resp: 13 12  Temp:      Last Pain:  Vitals:   08/27/16 1244  TempSrc: Tympanic                 Esha Fincher

## 2016-08-27 NOTE — Anesthesia Procedure Notes (Signed)
Date/Time: 08/27/2016 12:10 PM Performed by: Nelda Marseille Pre-anesthesia Checklist: Patient identified, Emergency Drugs available, Suction available, Patient being monitored and Timeout performed Oxygen Delivery Method: Nasal cannula

## 2016-08-27 NOTE — H&P (Signed)
Outpatient short stay form Pre-procedure 08/27/2016 11:41 AM Anna Sails MD  Primary Physician: Dr. Genene Churn  Reason for visit:  Colonoscopy  History of present illness:  Patient is a 66 year old female presenting today as above. She has personal history of adenomatous colon polyps. She tolerated her prep well. She takes no aspirin or blood thinning agents.    Current Facility-Administered Medications:  .  0.9 %  sodium chloride infusion, , Intravenous, Continuous, Anna Sails, MD, Last Rate: 20 mL/hr at 08/27/16 1118 .  0.9 %  sodium chloride infusion, , Intravenous, Continuous, Anna Sails, MD  Prescriptions Prior to Admission  Medication Sig Dispense Refill Last Dose  . albuterol (PROVENTIL HFA;VENTOLIN HFA) 108 (90 Base) MCG/ACT inhaler Inhale 2 puffs into the lungs every 6 (six) hours as needed for wheezing or shortness of breath.   Past Week at Unknown time  . budesonide-formoterol (SYMBICORT) 160-4.5 MCG/ACT inhaler Inhale 2 puffs into the lungs 2 (two) times daily.   08/27/2016 at 1015  . buPROPion (WELLBUTRIN XL) 300 MG 24 hr tablet Take 300 mg by mouth daily.   08/26/2016 at Unknown time  . calcium carbonate (OSCAL) 1500 (600 Ca) MG TABS tablet Take 1,500 mg by mouth 2 (two) times daily with a meal.   08/26/2016 at Unknown time  . carvedilol (COREG) 12.5 MG tablet Take 12.5 mg by mouth 2 (two) times daily with a meal.   08/27/2016 at 0600  . cholecalciferol (VITAMIN D) 400 units TABS tablet Take 1,000 Units by mouth daily.   08/26/2016 at Unknown time  . clonazePAM (KLONOPIN) 0.5 MG tablet Take 0.5 mg by mouth 2 (two) times daily as needed for anxiety.   Past Week at Unknown time  . digoxin (LANOXIN) 0.125 MG tablet Take 0.125 mg by mouth daily.   08/27/2016 at 0600  . donepezil (ARICEPT) 10 MG tablet Take 10 mg by mouth at bedtime.   08/26/2016 at 2100  . furosemide (LASIX) 20 MG tablet Take 20 mg by mouth.   08/27/2016 at 0600  . lisinopril (PRINIVIL,ZESTRIL) 20 MG tablet  Take 20 mg by mouth daily.   08/27/2016 at 0600  . omeprazole (PRILOSEC) 20 MG capsule Take 20 mg by mouth daily.   08/27/2016 at 0600  . spironolactone (ALDACTONE) 25 MG tablet Take 25 mg by mouth daily.   08/27/2016 at 0600  . tiZANidine (ZANAFLEX) 4 MG capsule Take 4 mg by mouth 3 (three) times daily.   08/27/2016 at 0600  . traMADol (ULTRAM) 50 MG tablet Take by mouth every 6 (six) hours as needed.   08/26/2016 at Unknown time  . zolpidem (AMBIEN CR) 6.25 MG CR tablet Take 6.25 mg by mouth at bedtime as needed for sleep.   Past Week at Unknown time  . cephALEXin (KEFLEX) 500 MG capsule Take 1 capsule (500 mg total) by mouth 4 (four) times daily. (Patient not taking: Reported on 08/27/2016) 40 capsule 0 Completed Course at Unknown time     Allergies  Allergen Reactions  . Hctz [Hydrochlorothiazide] Other (See Comments)    Kidney Disorder     Past Medical History:  Diagnosis Date  . Asthma   . CHF (congestive heart failure) ([place])   . Chronic kidney disease    Renal Insufficiency Stage 3  . Degenerative disc disease, cervical   . Depression   . GERD (gastroesophageal reflux disease)   . Hyperlipidemia   . Hypertension   . Moderate mitral insufficiency   . Monoclonal gammopathy   .  Polymyositis ([place])   . Pulmonary hypertension   . Sleep apnea    Does not use CPAP; needs another sleep study    Review of systems:      Physical Exam    Heart and lungs: Regular rate and rhythm occasional dropped beat, systolic aortic murmur    HEENT: Normocephalic atraumatic eyes are anicteric    Other:     Pertinant exam for procedure: Soft nontender nondistended bowel sounds positive normoactive.    Planned proceedures: Colonoscopy and indicated procedures. I have discussed the risks benefits and complications of procedures to include not limited to bleeding, infection, perforation and the risk of sedation and the patient wishes to proceed.    Anna Sails, MD Gastroenterology 08/27/2016   11:41 AM

## 2016-08-27 NOTE — Transfer of Care (Signed)
Immediate Anesthesia Transfer of Care Note  Patient: Anna Romero  Procedure(s) Performed: Procedure(s): COLONOSCOPY (N/A)  Patient Location: PACU  Anesthesia Type:General  Level of Consciousness: awake and alert   Airway & Oxygen Therapy: Patient Spontanous Breathing and Patient connected to nasal cannula oxygen  Post-op Assessment: Report given to RN and Post -op Vital signs reviewed and stable  Post vital signs: Reviewed and stable  Last Vitals:  Vitals:   08/27/16 1008  BP: 129/76  Pulse: (!) 57  Resp: 16  Temp: 36.2 C    Last Pain:  Vitals:   08/27/16 1008  TempSrc: Tympanic         Complications: No apparent anesthesia complications

## 2016-08-27 NOTE — Anesthesia Preprocedure Evaluation (Signed)
Anesthesia Evaluation  Patient identified by MRN, date of birth, ID band Patient awake    Reviewed: Allergy & Precautions, NPO status , Patient's Chart, lab work & pertinent test results  History of Anesthesia Complications Negative for: history of anesthetic complications  Airway Mallampati: II  TM Distance: >3 FB Neck ROM: Full    Dental no notable dental hx.    Pulmonary asthma , sleep apnea ,    breath sounds clear to auscultation- rhonchi (-) wheezing      Cardiovascular hypertension, Pt. on medications +CHF  (-) CAD and (-) Past MI  Rhythm:Regular Rate:Normal - Systolic murmurs and - Diastolic murmurs Echo 08/28/42: MILD LV SYSTOLIC DYSFUNCTION WITH AN ESTIMATED EF = 40 % NORMAL RIGHT VENTRICULAR SYSTOLIC FUNCTION MODERATE TRICUSPID AND MITRAL VALVE INSUFFICIENCY NO VALVULAR STENOSIS MILD LV ENLARGEMENT MILD BIATRIAL ENLARGEMENT SMALL POSTERIOR PERICARDIAL EFFUSION NOTED   Neuro/Psych PSYCHIATRIC DISORDERS Depression    GI/Hepatic Neg liver ROS, GERD  ,  Endo/Other  negative endocrine ROSneg diabetes  Renal/GU Renal InsufficiencyRenal disease     Musculoskeletal  (+) Arthritis ,   Abdominal (+) - obese,   Peds  Hematology negative hematology ROS (+)   Anesthesia Other Findings Past Medical History: No date: Asthma No date: CHF (congestive heart failure) ([facility]) No date: Chronic kidney disease     Comment: Renal Insufficiency Stage 3 No date: Degenerative disc disease, cervical No date: Depression No date: GERD (gastroesophageal reflux disease) No date: Hyperlipidemia No date: Hypertension No date: Moderate mitral insufficiency No date: Monoclonal gammopathy No date: Polymyositis ([facility]) No date: Pulmonary hypertension No date: Sleep apnea     Comment: Does not use CPAP; needs another sleep study   Reproductive/Obstetrics                             Anesthesia  Physical Anesthesia Plan  ASA: III  Anesthesia Plan: General   Post-op Pain Management:    Induction: Intravenous  Airway Management Planned: Natural Airway  Additional Equipment:   Intra-op Plan:   Post-operative Plan:   Informed Consent: I have reviewed the patients History and Physical, chart, labs and discussed the procedure including the risks, benefits and alternatives for the proposed anesthesia with the patient or authorized representative who has indicated his/her understanding and acceptance.   Dental advisory given  Plan Discussed with: CRNA and Anesthesiologist  Anesthesia Plan Comments:         Anesthesia Quick Evaluation

## 2016-08-27 NOTE — OR Nursing (Signed)
NS Lift done 7 ml NS uased.

## 2016-08-27 NOTE — Anesthesia Post-op Follow-up Note (Cosign Needed)
Anesthesia QCDR form completed.        

## 2016-08-28 ENCOUNTER — Encounter: Payer: Self-pay | Admitting: Gastroenterology

## 2016-08-28 LAB — SURGICAL PATHOLOGY

## 2016-10-08 ENCOUNTER — Other Ambulatory Visit: Payer: Self-pay | Admitting: Internal Medicine

## 2016-10-08 DIAGNOSIS — Z1231 Encounter for screening mammogram for malignant neoplasm of breast: Secondary | ICD-10-CM

## 2016-10-12 DIAGNOSIS — M2550 Pain in unspecified joint: Secondary | ICD-10-CM | POA: Insufficient documentation

## 2016-10-12 DIAGNOSIS — M25521 Pain in right elbow: Secondary | ICD-10-CM | POA: Insufficient documentation

## 2016-10-12 DIAGNOSIS — M791 Myalgia, unspecified site: Secondary | ICD-10-CM | POA: Insufficient documentation

## 2016-12-19 ENCOUNTER — Other Ambulatory Visit: Payer: Self-pay | Admitting: Neurology

## 2016-12-19 DIAGNOSIS — R413 Other amnesia: Secondary | ICD-10-CM

## 2016-12-19 DIAGNOSIS — R410 Disorientation, unspecified: Secondary | ICD-10-CM

## 2017-01-01 ENCOUNTER — Ambulatory Visit (HOSPITAL_COMMUNITY): Admission: RE | Admit: 2017-01-01 | Payer: Medicare PPO | Source: Ambulatory Visit

## 2017-01-28 ENCOUNTER — Ambulatory Visit
Admission: RE | Admit: 2017-01-28 | Discharge: 2017-01-28 | Disposition: A | Discharge status: Home / Self Care | Payer: Medicare PPO | Source: Ambulatory Visit | Attending: Internal Medicine | Admitting: Internal Medicine

## 2017-01-28 DIAGNOSIS — Z1231 Encounter for screening mammogram for malignant neoplasm of breast: Secondary | ICD-10-CM

## 2017-01-30 ENCOUNTER — Other Ambulatory Visit: Payer: Self-pay | Admitting: Internal Medicine

## 2017-01-30 DIAGNOSIS — R928 Other abnormal and inconclusive findings on diagnostic imaging of breast: Secondary | ICD-10-CM

## 2017-01-30 DIAGNOSIS — R921 Mammographic calcification found on diagnostic imaging of breast: Secondary | ICD-10-CM | POA: Insufficient documentation

## 2017-02-04 ENCOUNTER — Ambulatory Visit (HOSPITAL_COMMUNITY): Payer: Medicare PPO

## 2017-02-12 ENCOUNTER — Other Ambulatory Visit (HOSPITAL_COMMUNITY): Payer: Self-pay | Admitting: Neurology

## 2017-02-12 DIAGNOSIS — R410 Disorientation, unspecified: Secondary | ICD-10-CM

## 2017-02-12 DIAGNOSIS — R413 Other amnesia: Secondary | ICD-10-CM

## 2017-02-19 ENCOUNTER — Ambulatory Visit
Admission: RE | Admit: 2017-02-19 | Discharge: 2017-02-19 | Disposition: A | Discharge status: Home / Self Care | Payer: Medicare PPO | Source: Ambulatory Visit | Attending: Internal Medicine | Admitting: Internal Medicine

## 2017-02-19 DIAGNOSIS — R928 Other abnormal and inconclusive findings on diagnostic imaging of breast: Secondary | ICD-10-CM

## 2017-02-19 DIAGNOSIS — R921 Mammographic calcification found on diagnostic imaging of breast: Secondary | ICD-10-CM | POA: Insufficient documentation

## 2017-02-21 ENCOUNTER — Other Ambulatory Visit: Payer: Medicare PPO

## 2017-02-21 ENCOUNTER — Ambulatory Visit: Payer: Medicare PPO

## 2017-02-22 ENCOUNTER — Ambulatory Visit (HOSPITAL_COMMUNITY)
Admission: RE | Admit: 2017-02-22 | Discharge: 2017-02-22 | Disposition: A | Discharge status: Home / Self Care | Payer: Medicare PPO | Source: Ambulatory Visit | Attending: Neurology | Admitting: Neurology

## 2017-02-22 DIAGNOSIS — R4189 Other symptoms and signs involving cognitive functions and awareness: Secondary | ICD-10-CM | POA: Insufficient documentation

## 2017-02-22 DIAGNOSIS — R413 Other amnesia: Secondary | ICD-10-CM

## 2017-02-22 DIAGNOSIS — R410 Disorientation, unspecified: Secondary | ICD-10-CM

## 2017-03-26 DIAGNOSIS — N183 Chronic kidney disease, stage 3 unspecified: Secondary | ICD-10-CM | POA: Insufficient documentation

## 2017-05-29 ENCOUNTER — Ambulatory Visit: Payer: Self-pay | Admitting: Psychiatry

## 2017-05-30 ENCOUNTER — Ambulatory Visit (INDEPENDENT_AMBULATORY_CARE_PROVIDER_SITE_OTHER): Payer: Medicare PPO | Admitting: Psychiatry

## 2017-05-30 ENCOUNTER — Encounter: Payer: Self-pay | Admitting: Psychiatry

## 2017-05-30 ENCOUNTER — Other Ambulatory Visit: Payer: Self-pay

## 2017-05-30 VITALS — BP 135/83 | HR 61 | Temp 98.2°F | Wt 166.8 lb

## 2017-05-30 DIAGNOSIS — F330 Major depressive disorder, recurrent, mild: Secondary | ICD-10-CM

## 2017-05-30 DIAGNOSIS — M3320 Polymyositis, organ involvement unspecified: Secondary | ICD-10-CM | POA: Insufficient documentation

## 2017-05-30 DIAGNOSIS — F09 Unspecified mental disorder due to known physiological condition: Secondary | ICD-10-CM

## 2017-05-30 MED ORDER — TRAZODONE HCL 50 MG PO TABS: 25.0000 mg | ORAL_TABLET | Freq: Every evening | ORAL | 2 refills | Status: DC | PRN

## 2017-05-30 NOTE — Patient Instructions (Signed)
Trazodone tablets What is this medicine? TRAZODONE (TRAZ oh done) is used to treat depression. This medicine may be used for other purposes; ask your health care provider or pharmacist if you have questions. COMMON BRAND NAME(S): Desyrel What should I tell my health care provider before I take this medicine? They need to know if you have any of these conditions: -attempted suicide or thinking about it -bipolar disorder -bleeding problems -glaucoma -heart disease, or previous heart attack -irregular heart beat -kidney or liver disease -low levels of sodium in the blood -an unusual or allergic reaction to trazodone, other medicines, foods, dyes or preservatives -pregnant or trying to get pregnant -breast-feeding How should I use this medicine? Take this medicine by mouth with a glass of water. Follow the directions on the prescription label. Take this medicine shortly after a meal or a light snack. Take your medicine at regular intervals. Do not take your medicine more often than directed. Do not stop taking this medicine suddenly except upon the advice of your doctor. Stopping this medicine too quickly may cause serious side effects or your condition may worsen. A special MedGuide will be given to you by the pharmacist with each prescription and refill. Be sure to read this information carefully each time. Talk to your pediatrician regarding the use of this medicine in children. Special care may be needed. Overdosage: If you think you have taken too much of this medicine contact a poison control center or emergency room at once. NOTE: This medicine is only for you. Do not share this medicine with others. What if I miss a dose? If you miss a dose, take it as soon as you can. If it is almost time for your next dose, take only that dose. Do not take double or extra doses. What may interact with this medicine? Do not take this medicine with any of the following medications: -certain medicines  for fungal infections like fluconazole, itraconazole, ketoconazole, posaconazole, voriconazole -cisapride -dofetilide -dronedarone -linezolid -MAOIs like Carbex, Eldepryl, Marplan, Nardil, and Parnate -mesoridazine -methylene blue (injected into a vein) -pimozide -saquinavir -thioridazine -ziprasidone This medicine may also interact with the following medications: -alcohol -antiviral medicines for HIV or AIDS -aspirin and aspirin-like medicines -barbiturates like phenobarbital -certain medicines for blood pressure, heart disease, irregular heart beat -certain medicines for depression, anxiety, or psychotic disturbances -certain medicines for migraine headache like almotriptan, eletriptan, frovatriptan, naratriptan, rizatriptan, sumatriptan, zolmitriptan -certain medicines for seizures like carbamazepine and phenytoin -certain medicines for sleep -certain medicines that treat or prevent blood clots like dalteparin, enoxaparin, warfarin -digoxin -fentanyl -lithium -NSAIDS, medicines for pain and inflammation, like ibuprofen or naproxen -other medicines that prolong the QT interval (cause an abnormal heart rhythm) -rasagiline -supplements like St. John's wort, kava kava, valerian -tramadol -tryptophan This list may not describe all possible interactions. Give your health care provider a list of all the medicines, herbs, non-prescription drugs, or dietary supplements you use. Also tell them if you smoke, drink alcohol, or use illegal drugs. Some items may interact with your medicine. What should I watch for while using this medicine? Tell your doctor if your symptoms do not get better or if they get worse. Visit your doctor or health care professional for regular checks on your progress. Because it may take several weeks to see the full effects of this medicine, it is important to continue your treatment as prescribed by your doctor. Patients and their families should watch out for new  or worsening thoughts of suicide or depression. Also   watch out for sudden changes in feelings such as feeling anxious, agitated, panicky, irritable, hostile, aggressive, impulsive, severely restless, overly excited and hyperactive, or not being able to sleep. If this happens, especially at the beginning of treatment or after a change in dose, call your health care professional. You may get drowsy or dizzy. Do not drive, use machinery, or do anything that needs mental alertness until you know how this medicine affects you. Do not stand or sit up quickly, especially if you are an older patient. This reduces the risk of dizzy or fainting spells. Alcohol may interfere with the effect of this medicine. Avoid alcoholic drinks. This medicine may cause dry eyes and blurred vision. If you wear contact lenses you may feel some discomfort. Lubricating drops may help. See your eye doctor if the problem does not go away or is severe. Your mouth may get dry. Chewing sugarless gum, sucking hard candy and drinking plenty of water may help. Contact your doctor if the problem does not go away or is severe. What side effects may I notice from receiving this medicine? Side effects that you should report to your doctor or health care professional as soon as possible: -allergic reactions like skin rash, itching or hives, swelling of the face, lips, or tongue -elevated mood, decreased need for sleep, racing thoughts, impulsive behavior -confusion -fast, irregular heartbeat -feeling faint or lightheaded, falls -feeling agitated, angry, or irritable -loss of balance or coordination -painful or prolonged erections -restlessness, pacing, inability to keep still -suicidal thoughts or other mood changes -tremors -trouble sleeping -seizures -unusual bleeding or bruising Side effects that usually do not require medical attention (report to your doctor or health care professional if they continue or are bothersome): -change in  sex drive or performance -change in appetite or weight -constipation -headache -muscle aches or pains -nausea This list may not describe all possible side effects. Call your doctor for medical advice about side effects. You may report side effects to FDA at 1-800-FDA-1088. Where should I keep my medicine? Keep out of the reach of children. Store at room temperature between 15 and 30 degrees C (59 to 86 degrees F). Protect from light. Keep container tightly closed. Throw away any unused medicine after the expiration date. NOTE: This sheet is a summary. It may not cover all possible information. If you have questions about this medicine, talk to your doctor, pharmacist, or health care provider.  2018 Elsevier/Gold Standard (2015-10-06 16:57:05)  
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
## 2017-05-31 ENCOUNTER — Encounter: Payer: Self-pay | Admitting: Psychiatry

## 2017-06-05 ENCOUNTER — Telehealth: Payer: Self-pay

## 2017-06-05 ENCOUNTER — Other Ambulatory Visit: Payer: Self-pay | Admitting: Psychiatry

## 2017-06-05 DIAGNOSIS — F330 Major depressive disorder, recurrent, mild: Secondary | ICD-10-CM

## 2017-06-05 MED ORDER — TRAZODONE HCL 50 MG PO TABS: 25.0000 mg | ORAL_TABLET | Freq: Every evening | ORAL | 2 refills | Status: DC | PRN

## 2017-06-05 NOTE — Telephone Encounter (Signed)
pt states she can not find her rx for trazodone. states she can not find.

## 2017-06-05 NOTE — Telephone Encounter (Signed)
Trazodone 25-50 mg p.o. nightly as needed for sleep sent to her pharmacy. # 30 with 2 refills.

## 2017-06-05 NOTE — Telephone Encounter (Signed)
pharmacy has no record of rx.   traZODone (DESYREL) 50 MG tablet 30 tablet 2 05/30/2017    Sig - Route: Take 0.5-1 tablets (25-50 mg total) by mouth at bedtime as needed for sleep. - Oral   Class: Print

## 2017-06-17 ENCOUNTER — Ambulatory Visit: Payer: Medicare PPO | Admitting: Licensed Clinical Social Worker

## 2017-07-04 ENCOUNTER — Ambulatory Visit (INDEPENDENT_AMBULATORY_CARE_PROVIDER_SITE_OTHER): Payer: Medicare Other | Admitting: Licensed Clinical Social Worker

## 2017-07-04 DIAGNOSIS — F09 Unspecified mental disorder due to known physiological condition: Secondary | ICD-10-CM | POA: Diagnosis not present

## 2017-07-04 DIAGNOSIS — F330 Major depressive disorder, recurrent, mild: Secondary | ICD-10-CM | POA: Diagnosis not present
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
## 2017-07-24 ENCOUNTER — Ambulatory Visit: Payer: Self-pay | Admitting: Licensed Clinical Social Worker

## 2017-07-25 ENCOUNTER — Ambulatory Visit: Payer: Medicare PPO | Admitting: Psychiatry

## 2017-08-16 ENCOUNTER — Other Ambulatory Visit: Payer: Self-pay | Admitting: Internal Medicine

## 2017-08-16 DIAGNOSIS — R921 Mammographic calcification found on diagnostic imaging of breast: Secondary | ICD-10-CM

## 2017-08-19 ENCOUNTER — Other Ambulatory Visit: Payer: Self-pay

## 2017-08-21 LAB — CYTOLOGY - PAP: Diagnosis: NEGATIVE

## 2017-09-17 ENCOUNTER — Ambulatory Visit
Admission: RE | Admit: 2017-09-17 | Discharge: 2017-09-17 | Disposition: A | Discharge status: Home / Self Care | Payer: Medicare Other | Source: Ambulatory Visit | Attending: Internal Medicine | Admitting: Internal Medicine

## 2017-09-17 ENCOUNTER — Other Ambulatory Visit: Payer: Self-pay | Admitting: Internal Medicine

## 2017-09-17 DIAGNOSIS — N63 Unspecified lump in unspecified breast: Secondary | ICD-10-CM

## 2017-09-17 DIAGNOSIS — R921 Mammographic calcification found on diagnostic imaging of breast: Secondary | ICD-10-CM | POA: Insufficient documentation

## 2017-09-17 DIAGNOSIS — D241 Benign neoplasm of right breast: Secondary | ICD-10-CM | POA: Insufficient documentation

## 2017-09-17 DIAGNOSIS — N6310 Unspecified lump in the right breast, unspecified quadrant: Secondary | ICD-10-CM | POA: Diagnosis present

## 2017-11-15 ENCOUNTER — Institutional Professional Consult (permissible substitution): Payer: Medicare Other | Admitting: Internal Medicine

## 2017-11-15 ENCOUNTER — Encounter

## 2017-11-19 ENCOUNTER — Ambulatory Visit (INDEPENDENT_AMBULATORY_CARE_PROVIDER_SITE_OTHER): Payer: Medicare Other | Admitting: Pulmonary Disease

## 2017-11-19 ENCOUNTER — Encounter: Payer: Self-pay | Admitting: Pulmonary Disease

## 2017-11-19 ENCOUNTER — Institutional Professional Consult (permissible substitution): Payer: Medicare Other | Admitting: Pulmonary Disease

## 2017-11-19 VITALS — BP 120/68 | HR 54 | Ht 66.0 in | Wt 165.0 lb

## 2017-11-19 DIAGNOSIS — I272 Pulmonary hypertension, unspecified: Secondary | ICD-10-CM

## 2017-11-19 DIAGNOSIS — G4733 Obstructive sleep apnea (adult) (pediatric): Secondary | ICD-10-CM

## 2017-11-19 DIAGNOSIS — J4530 Mild persistent asthma, uncomplicated: Secondary | ICD-10-CM
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
## 2017-11-19 NOTE — Patient Instructions (Addendum)
For asthma: Continue Symbicort inhaler-2 inhalations twice a day Continue albuterol inhaler as needed for increased shortness of breath, wheezing, chest tightness, cough  For possible sleep apnea: We will perform overnight oximetry study and if significantly abnormal, consider repeat sleep study  Follow-up in 4 to 6 weeks with chest x-ray and lung function tests prior to that visit

## 2017-11-24 ENCOUNTER — Encounter: Payer: Self-pay | Admitting: Pulmonary Disease

## 2017-11-28 ENCOUNTER — Encounter: Payer: Self-pay | Admitting: Pulmonary Disease

## 2017-12-02 ENCOUNTER — Telehealth: Payer: Self-pay | Admitting: Pulmonary Disease

## 2017-12-02 NOTE — Telephone Encounter (Signed)
Advised patient that Dr. Alva Garnet has not received results of ONO. After reviewing she will get results and next step in process. Nothing further needed.

## 2017-12-02 NOTE — Telephone Encounter (Signed)
Please call regarding f/u sleep study.

## 2017-12-05 ENCOUNTER — Telehealth: Payer: Self-pay | Admitting: *Deleted

## 2017-12-05 NOTE — Telephone Encounter (Signed)
Pt informed that she does not need oxygen at night per ONO. Pt verbalized understanding. Nothing further needed.

## 2017-12-05 NOTE — Telephone Encounter (Signed)
Patient calling back Has more questions based on the oxygen Please call to discuss

## 2017-12-05 NOTE — Telephone Encounter (Signed)
Answered pt questions she had about the negative ONO. Nothing further needed.

## 2017-12-09 ENCOUNTER — Other Ambulatory Visit: Payer: Self-pay | Admitting: Internal Medicine

## 2017-12-09 DIAGNOSIS — N6310 Unspecified lump in the right breast, unspecified quadrant: Secondary | ICD-10-CM

## 2017-12-09 DIAGNOSIS — R921 Mammographic calcification found on diagnostic imaging of breast: Secondary | ICD-10-CM

## 2017-12-17 ENCOUNTER — Ambulatory Visit: Payer: Medicare Other | Admitting: Licensed Clinical Social Worker

## 2017-12-23 ENCOUNTER — Ambulatory Visit: Payer: Medicare Other | Admitting: Internal Medicine

## 2017-12-30 ENCOUNTER — Encounter

## 2017-12-30 ENCOUNTER — Ambulatory Visit: Payer: Medicare Other | Admitting: Licensed Clinical Social Worker

## 2017-12-31 ENCOUNTER — Ambulatory Visit: Payer: Medicare Other

## 2018-01-06 ENCOUNTER — Ambulatory Visit: Payer: Medicare Other | Admitting: Pulmonary Disease

## 2018-01-14 ENCOUNTER — Ambulatory Visit: Payer: Medicare Other | Attending: Pulmonary Disease

## 2018-01-14 DIAGNOSIS — J4530 Mild persistent asthma, uncomplicated: Secondary | ICD-10-CM | POA: Diagnosis present

## 2018-01-14 DIAGNOSIS — I272 Pulmonary hypertension, unspecified: Secondary | ICD-10-CM | POA: Insufficient documentation

## 2018-01-14 MED ORDER — ALBUTEROL SULFATE (2.5 MG/3ML) 0.083% IN NEBU
2.5000 mg | INHALATION_SOLUTION | Freq: Once | RESPIRATORY_TRACT | Status: AC
  Administered 2018-01-14: 2.5 mg via RESPIRATORY_TRACT
  Filled 2018-01-14: qty 3

## 2018-01-16 ENCOUNTER — Encounter: Payer: Self-pay | Admitting: Pulmonary Disease

## 2018-01-16 ENCOUNTER — Ambulatory Visit (INDEPENDENT_AMBULATORY_CARE_PROVIDER_SITE_OTHER): Payer: Medicare Other | Admitting: Pulmonary Disease

## 2018-01-16 VITALS — BP 132/80 | HR 50 | Ht 66.0 in | Wt 183.0 lb

## 2018-01-16 DIAGNOSIS — I272 Pulmonary hypertension, unspecified: Secondary | ICD-10-CM | POA: Diagnosis not present

## 2018-01-16 DIAGNOSIS — J984 Other disorders of lung: Secondary | ICD-10-CM

## 2018-01-16 DIAGNOSIS — Z23 Encounter for immunization: Secondary | ICD-10-CM | POA: Diagnosis not present

## 2018-01-16 DIAGNOSIS — J449 Chronic obstructive pulmonary disease, unspecified: Secondary | ICD-10-CM | POA: Diagnosis not present

## 2018-01-16 DIAGNOSIS — J439 Emphysema, unspecified: Secondary | ICD-10-CM

## 2018-01-16 NOTE — Progress Notes (Signed)
PULMONARY OFFICE FOLLOW-UP NOTE  Requesting MD/Service: Raechel Ache Date of initial consultation: 11/19/17 Reason for consultation: H/O asthma, ? H/O OSA, PAH  PT PROFILE: 67 y.o. female never smoker with documented h/o OSA (though PSG in 2016 revealed no OSA), documented h/o asthma and PAH by echocardiogram. 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 Previously followed by Dr Raul Del  DATA: PFTs 10/06/14 Jefm Bryant): FVC 2.64  (110% predicted),  FEV1 1.71 L  (88% predicted), FEV1/FVC 64%, TLC (77% pred), DLCO (72% pred), DLCO/VA (102% pred) PSG 03/02/15: no significant apnea. PLM index 9.1, PLM arousal index 1.7 Echo 05/24/15: LVEF 40%, global HK, moderate MR, RVSP 45 mmHg CT chest 12/02/15: Left lower lobe scarring with associated pleural thickening and trace pleural effusion, chronic. No evidence of acute cardiopulmonary disease Echo 07/09/17: LVEF 40%, global HK, moderate MR, RVSP 43 mmHg Overnight oximetry 11/2017: No significant nocturnal desaturations PFTs 01/14/2018: FVC: 2.07 > 2.12 L (66 > 68 %pred), FEV1: 1.40 > 1.49 L (56 > 60 %pred), FEV1/FVC: 68%, TLC: 3.54 L (68 %pred), DLCO 41 %pred, DLCO/VA 102% predicted.  No significant response to bronchodilator except for 34% improvement in FEF 25-75%   INTERVAL: Last seen 11/19/2017.  No major events   Subjective:  This is a scheduled follow-up visit to review results of overnight oximetry and pulmonary function tests which are documented above.  When I initially evaluated her, she carried a diagnosis of asthma but was not on the previously prescribed Symbicort inhaler.  Per my recommendation, she has resumed Symbicort inhaler and feels that her shortness of breath has improved significantly.  She is not using albuterol at all.  She denies CP, fever, purulent sputum, hemoptysis, LE edema and calf tenderness.  Her overnight oximetry failed to demonstrate significant desaturation.  She does describe insomnia and does  not believe that she slept well on the night of that test.   Vitals:   01/16/18 1123 01/16/18 1138  BP:  132/80  Pulse:  (!) 50  SpO2:  99%  Weight: 183 lb (83 kg)   Height: 5\' 6"  (1.676 m)   RA   EXAM:  Gen: NAD HEENT: NCAT, sclera white Neck: No JVD Lungs: breath sounds are markedly diminished in left base. There are no wheezes or other adventitious sounds Cardiovascular: RRR, no murmurs Abdomen: Soft, nontender, normal BS Ext: without clubbing, cyanosis, edema Neuro: grossly intact Skin: Limited exam, no lesions noted   DATA:   BMP Latest Ref Rng & Units 01/26/2014 10/13/2013 12/08/2012  Glucose 65 - 99 mg/dL 85 107(H) 91  BUN 7 - 18 mg/dL 22(H) 37(H) 18  Creatinine 0.60 - 1.30 mg/dL 1.11 1.52(H) 1.15  Sodium 136 - 145 mmol/L 140 140 139  Potassium 3.5 - 5.1 mmol/L 3.8 4.7 4.3  Chloride 98 - 107 mmol/L 107 108(H) 103  CO2 21 - 32 mmol/L 27 27 33(H)  Calcium 8.5 - 10.1 mg/dL 9.1 8.9 9.7    CBC Latest Ref Rng & Units 01/26/2014 10/13/2013 03/11/2012  WBC 3.6 - 11.0 x10 3/mm 3 7.4 8.6 10.9  Hemoglobin 12.0 - 16.0 g/dL 13.1 12.9 14.2  Hematocrit 35.0 - 47.0 % 39.7 39.5 42.9  Platelets 150 - 440 x10 3/mm 3 182 162 211    CXR: No new film  I have personally reviewed all chest radiographs reported above including CXRs and CT chest unless otherwise indicated  IMPRESSION:     ICD-10-CM   1. Mild pulmonary hypertension - likely group 2 (due to  left heart disease) I27.20   2. Mixed restrictive and obstructive lung disease. Restriction predominates. There is probably small airway obstruction as well.  J44.9    J98.4   3. Need for prophylactic vaccination and inoculation against influenza Z23 Flu vaccine HIGH DOSE PF (Fluzone High Dose)   Mild pulmonary hypertension is likely group 2 (due to left heart disease).  She has a documented idiopathic cardiomyopathy with LVEF 40%.  OSA is effectively ruled out by a negative PSG previously and overnight oximetry which reveals no  significant oxygen desaturation.  By PFTs, she does appear to have mild obstructive airways disease and her response to Symbicort suggests a reversible component of obstruction.  The restriction is likely due to chronic pleural-parenchymal scarring in the left lower lobe. The cause of this is unclear to me but it has been present for many years.   PLAN:  Continue Symbicort inhaler, 2 actuations twice a day.  Rinse mouth after use  Continue albuterol inhaler as needed for increased shortness of breath, wheezing, chest tightness, cough  Follow-up in 6 months at which time we should schedule a repeat echocardiogram to monitor pulmonary hypertension.   Flu vaccination administered today   Merton Border, MD PCCM service Mobile [phone] Pager [phone] 01/16/2018 12:51 PM

## 2018-01-16 NOTE — Patient Instructions (Addendum)
Continue Symbicort inhaler, 2 sprays twice a day.  Rinse mouth after use  Continue albuterol inhaler as needed for increased shortness of breath, wheezing, chest tightness, cough  Follow-up in 6 months at which time we should schedule a repeat echocardiogram to monitor blood pressure in your lung.  Call sooner as needed  Flu vaccination administered today

## 2018-03-03 ENCOUNTER — Telehealth: Payer: Self-pay | Admitting: Pulmonary Disease

## 2018-03-03 MED ORDER — BUDESONIDE-FORMOTEROL FUMARATE 160-4.5 MCG/ACT IN AERO: 2.0000 | INHALATION_SPRAY | Freq: Two times a day (BID) | RESPIRATORY_TRACT | 1 refills | Status: DC

## 2018-03-03 NOTE — Telephone Encounter (Signed)
°*  STAT* If patient is at the pharmacy, call can be transferred to refill team.   1. Which medications need to be refilled? (please list name of each medication and dose if known)  Budesonide-formoterol (SYMBICORT)  2. Which pharmacy/location (including street and city if local pharmacy) is medication to be sent to? [facility] in [place]  3. Do they need a 30 day or 90 day supply? 90 day

## 2018-04-01 ENCOUNTER — Ambulatory Visit
Admission: RE | Admit: 2018-04-01 | Discharge: 2018-04-01 | Disposition: A | Discharge status: Home / Self Care | Payer: Medicare Other | Source: Ambulatory Visit | Attending: Internal Medicine | Admitting: Internal Medicine

## 2018-04-01 DIAGNOSIS — R921 Mammographic calcification found on diagnostic imaging of breast: Secondary | ICD-10-CM

## 2018-04-01 DIAGNOSIS — N6310 Unspecified lump in the right breast, unspecified quadrant: Secondary | ICD-10-CM
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
## 2018-07-15 ENCOUNTER — Encounter: Payer: Self-pay | Admitting: Family

## 2018-07-15 ENCOUNTER — Ambulatory Visit: Payer: Medicare Other | Attending: Family | Admitting: Family

## 2018-07-15 VITALS — BP 141/76 | HR 55 | Resp 18 | Ht 66.0 in | Wt 174.0 lb

## 2018-07-15 DIAGNOSIS — Z882 Allergy status to sulfonamides status: Secondary | ICD-10-CM | POA: Insufficient documentation

## 2018-07-15 DIAGNOSIS — F419 Anxiety disorder, unspecified: Secondary | ICD-10-CM | POA: Insufficient documentation

## 2018-07-15 DIAGNOSIS — N183 Chronic kidney disease, stage 3 (moderate): Secondary | ICD-10-CM | POA: Insufficient documentation

## 2018-07-15 DIAGNOSIS — I130 Hypertensive heart and chronic kidney disease with heart failure and stage 1 through stage 4 chronic kidney disease, or unspecified chronic kidney disease: Secondary | ICD-10-CM | POA: Insufficient documentation

## 2018-07-15 DIAGNOSIS — I509 Heart failure, unspecified: Secondary | ICD-10-CM | POA: Insufficient documentation

## 2018-07-15 DIAGNOSIS — K219 Gastro-esophageal reflux disease without esophagitis: Secondary | ICD-10-CM | POA: Insufficient documentation

## 2018-07-15 DIAGNOSIS — J449 Chronic obstructive pulmonary disease, unspecified: Secondary | ICD-10-CM | POA: Diagnosis not present

## 2018-07-15 DIAGNOSIS — F32A Depression, unspecified: Secondary | ICD-10-CM | POA: Insufficient documentation

## 2018-07-15 DIAGNOSIS — Z79899 Other long term (current) drug therapy: Secondary | ICD-10-CM | POA: Diagnosis not present

## 2018-07-15 DIAGNOSIS — F329 Major depressive disorder, single episode, unspecified: Secondary | ICD-10-CM | POA: Insufficient documentation

## 2018-07-15 DIAGNOSIS — I10 Essential (primary) hypertension: Secondary | ICD-10-CM

## 2018-07-15 DIAGNOSIS — I5022 Chronic systolic (congestive) heart failure: Secondary | ICD-10-CM

## 2018-07-15 NOTE — Patient Instructions (Addendum)
Start weighing daily and call for an overnight weight gain of > 2 pounds or a weekly weight gain of >5 pounds.  

## 2018-07-21 ENCOUNTER — Encounter: Payer: Self-pay | Admitting: *Deleted

## 2018-07-22 ENCOUNTER — Ambulatory Visit: Payer: Medicare Other | Admitting: Anesthesiology

## 2018-07-22 ENCOUNTER — Encounter
Admission: RE | Disposition: A | Discharge status: Home / Self Care | Payer: Self-pay | Source: Ambulatory Visit | Attending: Gastroenterology

## 2018-07-22 ENCOUNTER — Ambulatory Visit
Admission: RE | Admit: 2018-07-22 | Discharge: 2018-07-22 | Disposition: A | Discharge status: Home / Self Care | Payer: Medicare Other | Source: Ambulatory Visit | Attending: Gastroenterology | Admitting: Gastroenterology

## 2018-07-22 ENCOUNTER — Other Ambulatory Visit: Payer: Self-pay

## 2018-07-22 DIAGNOSIS — J449 Chronic obstructive pulmonary disease, unspecified: Secondary | ICD-10-CM | POA: Diagnosis not present

## 2018-07-22 DIAGNOSIS — Z86010 Personal history of colonic polyps: Secondary | ICD-10-CM | POA: Insufficient documentation

## 2018-07-22 DIAGNOSIS — M1990 Unspecified osteoarthritis, unspecified site: Secondary | ICD-10-CM | POA: Diagnosis not present

## 2018-07-22 DIAGNOSIS — Z09 Encounter for follow-up examination after completed treatment for conditions other than malignant neoplasm: Secondary | ICD-10-CM | POA: Diagnosis not present

## 2018-07-22 DIAGNOSIS — D120 Benign neoplasm of cecum: Secondary | ICD-10-CM | POA: Insufficient documentation

## 2018-07-22 DIAGNOSIS — K219 Gastro-esophageal reflux disease without esophagitis: Secondary | ICD-10-CM | POA: Diagnosis not present

## 2018-07-22 DIAGNOSIS — F419 Anxiety disorder, unspecified: Secondary | ICD-10-CM | POA: Insufficient documentation

## 2018-07-22 DIAGNOSIS — D122 Benign neoplasm of ascending colon: Secondary | ICD-10-CM | POA: Insufficient documentation

## 2018-07-22 DIAGNOSIS — Z7951 Long term (current) use of inhaled steroids: Secondary | ICD-10-CM | POA: Diagnosis not present

## 2018-07-22 DIAGNOSIS — K640 First degree hemorrhoids: Secondary | ICD-10-CM | POA: Insufficient documentation

## 2018-07-22 DIAGNOSIS — I272 Pulmonary hypertension, unspecified: Secondary | ICD-10-CM | POA: Diagnosis not present

## 2018-07-22 DIAGNOSIS — I130 Hypertensive heart and chronic kidney disease with heart failure and stage 1 through stage 4 chronic kidney disease, or unspecified chronic kidney disease: Secondary | ICD-10-CM | POA: Diagnosis not present

## 2018-07-22 DIAGNOSIS — K5730 Diverticulosis of large intestine without perforation or abscess without bleeding: Secondary | ICD-10-CM | POA: Insufficient documentation

## 2018-07-22 DIAGNOSIS — I509 Heart failure, unspecified: Secondary | ICD-10-CM | POA: Diagnosis not present

## 2018-07-22 DIAGNOSIS — N183 Chronic kidney disease, stage 3 (moderate): Secondary | ICD-10-CM | POA: Diagnosis not present

## 2018-07-22 DIAGNOSIS — Z79899 Other long term (current) drug therapy: Secondary | ICD-10-CM | POA: Insufficient documentation

## 2018-07-22 DIAGNOSIS — F329 Major depressive disorder, single episode, unspecified: Secondary | ICD-10-CM | POA: Diagnosis not present

## 2018-07-22 HISTORY — PX: COLONOSCOPY WITH PROPOFOL: SHX5780

## 2018-07-22 SURGERY — COLONOSCOPY WITH PROPOFOL
Anesthesia: General

## 2018-07-22 MED ORDER — EPHEDRINE SULFATE 50 MG/ML IJ SOLN
INTRAMUSCULAR | Status: DC | PRN
  Administered 2018-07-22: 10 mg via INTRAVENOUS

## 2018-07-22 MED ORDER — LIDOCAINE HCL (PF) 1 % IJ SOLN
INTRAMUSCULAR | Status: AC
  Filled 2018-07-22: qty 2

## 2018-07-22 MED ORDER — PROPOFOL 500 MG/50ML IV EMUL
INTRAVENOUS | Status: AC
  Filled 2018-07-22: qty 50

## 2018-07-22 MED ORDER — PROPOFOL 500 MG/50ML IV EMUL
INTRAVENOUS | Status: DC | PRN
  Administered 2018-07-22: 150 ug/kg/min via INTRAVENOUS

## 2018-07-22 MED ORDER — PROPOFOL 10 MG/ML IV BOLUS
INTRAVENOUS | Status: DC | PRN
  Administered 2018-07-22: 20 mg via INTRAVENOUS
  Administered 2018-07-22: 40 mg via INTRAVENOUS

## 2018-07-22 MED ORDER — PROPOFOL 10 MG/ML IV BOLUS
INTRAVENOUS | Status: AC
  Filled 2018-07-22: qty 20

## 2018-07-22 NOTE — Anesthesia Preprocedure Evaluation (Signed)
Anesthesia Evaluation  Patient identified by MRN, date of birth, ID band Patient awake    Reviewed: Allergy & Precautions, NPO status , Patient's Chart, lab work & pertinent test results  History of Anesthesia Complications Negative for: history of anesthetic complications  Airway Mallampati: II  TM Distance: >3 FB Neck ROM: Full    Dental  (+) Poor Dentition   Pulmonary asthma , COPD,  COPD inhaler,    breath sounds clear to auscultation- rhonchi (-) wheezing      Cardiovascular hypertension, Pt. on medications +CHF (EF 40%)  (-) CAD, (-) Past MI, (-) Cardiac Stents and (-) CABG  Rhythm:Regular Rate:Normal - Systolic murmurs and - Diastolic murmurs    Neuro/Psych neg Seizures PSYCHIATRIC DISORDERS Anxiety Depression negative neurological ROS     GI/Hepatic Neg liver ROS, GERD  ,  Endo/Other  negative endocrine ROSneg diabetes  Renal/GU Renal InsufficiencyRenal disease     Musculoskeletal  (+) Arthritis ,   Abdominal (+) - obese,   Peds  Hematology negative hematology ROS (+)   Anesthesia Other Findings Past Medical History: No date: Anxiety No date: CHF (congestive heart failure) (HCC) No date: Chronic kidney disease     Comment:  Renal Insufficiency Stage 3 No date: Degenerative disc disease, cervical No date: Depression No date: GERD (gastroesophageal reflux disease) No date: Hyperlipidemia No date: Hypertension No date: Mixed restrictive and obstructive lung disease (HCC)     Comment:  Predominantly restriction due to chronic volume loss in               the left lung.  Mild small airways disease. No date: Moderate mitral insufficiency No date: Monoclonal gammopathy No date: Polymyositis (HCC) No date: Pulmonary hypertension (HCC)   Reproductive/Obstetrics                             Anesthesia Physical Anesthesia Plan  ASA: III  Anesthesia Plan: General   Post-op Pain  Management:    Induction: Intravenous  PONV Risk Score and Plan: 2 and Propofol infusion  Airway Management Planned: Natural Airway  Additional Equipment:   Intra-op Plan:   Post-operative Plan:   Informed Consent: I have reviewed the patients History and Physical, chart, labs and discussed the procedure including the risks, benefits and alternatives for the proposed anesthesia with the patient or authorized representative who has indicated his/her understanding and acceptance.     Dental advisory given  Plan Discussed with: CRNA and Anesthesiologist  Anesthesia Plan Comments:         Anesthesia Quick Evaluation

## 2018-07-22 NOTE — Anesthesia Post-op Follow-up Note (Signed)
Anesthesia QCDR form completed.        

## 2018-07-22 NOTE — H&P (Signed)
Outpatient short stay form Pre-procedure 07/22/2018 2:20 PM Anna Sails MD  Primary Physician: Genene Churn, MD  Reason for visit: Colonoscopy  History of present illness: Patient is a 68 year old female presenting today for colonoscopy in regards to her personal history of adenomatous colon polyps.  Her last colonoscopy was 09/06/2016 with a large adenoma being removed from the distal ascending colon.  She also showed some diverticulosis at that time. She tolerated her prep well.  She takes no aspirin or blood thinning agent.   Current Facility-Administered Medications:  .  lidocaine (PF) (XYLOCAINE) 1 % injection, , , ,   Medications Prior to Admission  Medication Sig Dispense Refill Last Dose  . acetaminophen (TYLENOL) 500 MG tablet Take by mouth.   Taking  . albuterol (PROVENTIL HFA;VENTOLIN HFA) 108 (90 Base) MCG/ACT inhaler Inhale 2 puffs into the lungs every 6 (six) hours as needed for wheezing or shortness of breath.   Taking  . budesonide-formoterol (SYMBICORT) 160-4.5 MCG/ACT inhaler Inhale 2 puffs into the lungs 2 (two) times daily. 3 Inhaler 1 07/21/2018  . buPROPion (WELLBUTRIN XL) 150 MG 24 hr tablet Take 150 mg by mouth daily.   3 Taking  . carvedilol (COREG) 12.5 MG tablet Take 12.5 mg by mouth 2 (two) times daily with a meal.   07/22/2018 at 0830  . Cholecalciferol (VITAMIN D3) 1000 units CAPS Take by mouth.   Taking  . clonazePAM (KLONOPIN) 0.5 MG tablet Take 0.5 mg by mouth 2 (two) times daily as needed for anxiety.   Taking  . donepezil (ARICEPT) 10 MG tablet Take 10 mg by mouth at bedtime.   Taking  . furosemide (LASIX) 20 MG tablet Take 20 mg by mouth.   Taking  . Multiple Vitamins-Minerals (MULTIVITAMIN WITH MINERALS) tablet Take 1 tablet by mouth daily.   Taking  . omeprazole (PRILOSEC) 20 MG capsule Take 20 mg by mouth daily.   Taking  . spironolactone (ALDACTONE) 25 MG tablet Take 25 mg by mouth daily.   07/22/2018 at 0830  . telmisartan (MICARDIS) 40 MG tablet Take  40 mg by mouth daily.   Taking  . traZODone (DESYREL) 100 MG tablet Take 100 mg by mouth at bedtime.   Taking     Allergies  Allergen Reactions  . Hctz [Hydrochlorothiazide] Other (See Comments)    Kidney Disorder     Past Medical History:  Diagnosis Date  . Anxiety   . CHF (congestive heart failure) ([place])   . Chronic kidney disease    Renal Insufficiency Stage 3  . Degenerative disc disease, cervical   . Depression   . GERD (gastroesophageal reflux disease)   . Hyperlipidemia   . Hypertension   . Mixed restrictive and obstructive lung disease ([place])    Predominantly restriction due to chronic volume loss in the left lung.  Mild small airways disease.  . Moderate mitral insufficiency   . Monoclonal gammopathy   . Polymyositis ([place])   . Pulmonary hypertension (HCC)     Review of systems:      Physical Exam    Heart and lungs: Regular rate and rhythm without rub or gallop, lungs are bilaterally clear.    HEENT: Normocephalic atraumatic eyes are anicteric    Other:    Pertinant exam for procedure: Soft nontender nondistended bowel sounds positive normoactive    Planned proceedures: Colonoscopy and indicated procedures. I have discussed the risks benefits and complications of procedures to include not limited to bleeding, infection, perforation and the risk of  sedation and the patient wishes to proceed.    Anna Sails, MD Gastroenterology 07/22/2018  2:20 PM

## 2018-07-22 NOTE — Transfer of Care (Signed)
Immediate Anesthesia Transfer of Care Note  Patient: Anna Romero  Procedure(s) Performed: COLONOSCOPY WITH PROPOFOL (N/A )  Patient Location: PACU  Anesthesia Type:General  Level of Consciousness: sedated  Airway & Oxygen Therapy: Patient Spontanous Breathing and Patient connected to nasal cannula oxygen  Post-op Assessment: Report given to RN and Post -op Vital signs reviewed and stable  Post vital signs: Reviewed and stable  Last Vitals:  Vitals Value Taken Time  BP 114/54 07/22/2018  3:22 PM  Temp 36.1 C 07/22/2018  3:21 PM  Pulse 139 07/22/2018  3:27 PM  Resp 19 07/22/2018  3:27 PM  SpO2 91 % 07/22/2018  3:27 PM  Vitals shown include unvalidated device data.  Last Pain:  Vitals:   07/22/18 1521  TempSrc: Tympanic  PainSc: Asleep         Complications: No apparent anesthesia complications

## 2018-07-23 ENCOUNTER — Encounter: Payer: Self-pay | Admitting: Gastroenterology

## 2018-07-23 NOTE — Anesthesia Postprocedure Evaluation (Signed)
Anesthesia Post Note  Patient: Anna Romero  Procedure(s) Performed: COLONOSCOPY WITH PROPOFOL (N/A )  Patient location during evaluation: PACU Anesthesia Type: General Level of consciousness: awake and alert Pain management: pain level controlled Vital Signs Assessment: post-procedure vital signs reviewed and stable Respiratory status: spontaneous breathing, nonlabored ventilation and respiratory function stable Cardiovascular status: blood pressure returned to baseline and stable Postop Assessment: no apparent nausea or vomiting Anesthetic complications: no     Last Vitals:  Vitals:   07/22/18 1531 07/22/18 1551  BP: (!) 109/97 (!) 142/88  Pulse:    Resp:    Temp:    SpO2:      Last Pain:  Vitals:   07/22/18 1551  TempSrc:   PainSc: 0-No pain                 Durenda Hurt

## 2018-07-23 NOTE — Op Note (Signed)
[facility] Patient Name: Anna Romero Procedure Date: 07/22/2018 2:17 PM MRN: [id number] Account #: [id number] Date of Birth: 1950/05/28 Admit Type: Outpatient Age: 68 Room: [facility] ENDO ROOM 3 Gender: Female Note Status: Finalized Procedure:            Colonoscopy Indications:          Personal history of colonic polyps Providers:            Lollie Sails, MD Medicines:            Monitored Anesthesia Care Complications:        No immediate complications. Procedure:            Pre-Anesthesia Assessment:                       - ASA Grade Assessment: III - A patient with severe                        systemic disease.                       After obtaining informed consent, the colonoscope was                        passed under direct vision. Throughout the procedure,                        the patient's blood pressure, pulse, and oxygen                        saturations were monitored continuously. The was                        introduced through the anus and advanced to the the                        cecum, identified by appendiceal orifice and ileocecal                        valve. The colonoscopy was performed without                        difficulty. The patient tolerated the procedure well.                        The quality of the bowel preparation was good. Findings:      Two sessile polyps were found in the cecum. The polyps were 1 to 2 mm in       size. These polyps were removed with a cold biopsy forceps. Resection       and retrieval were complete.      A 14 mm polyp was found in the mid ascending colon. The polyp was       sessile. Polypectomy was attempted, initially using a cold snare. Polyp       resection was incomplete with this device. This intervention then       required a different device and polypectomy technique. The polyp was       removed with a cold biopsy forceps. Resection and retrieval were       complete.     Multiple small and large-mouthed diverticula were found in the  sigmoid       colon and distal descending colon.      Non-bleeding internal hemorrhoids were found during retroflexion and       during anoscopy. The hemorrhoids were small and Grade I (internal       hemorrhoids that do not prolapse).      No additional abnormalities were found on retroflexion.      The digital rectal exam was normal. Impression:           - Two 1 to 2 mm polyps in the cecum, removed with a                        cold biopsy forceps. Resected and retrieved.                       - One 14 mm polyp in the mid ascending colon, removed                        with a cold biopsy forceps. Resected and retrieved.                       - Diverticulosis in the sigmoid colon and in the distal                        descending colon.                       - Non-bleeding internal hemorrhoids. Recommendation:       - Discharge patient to home.                       - Await pathology results.                       - Telephone GI clinic for pathology results in 5 days. Procedure Code(s):    --- Professional ---                       [phone], Colonoscopy, flexible; with biopsy, single or                        multiple Diagnosis Code(s):    --- Professional ---                       D12.0, Benign neoplasm of cecum                       D12.2, Benign neoplasm of ascending colon                       K64.0, First degree hemorrhoids                       Z86.010, Personal history of colonic polyps                       K57.30, Diverticulosis of large intestine without                        perforation or abscess without bleeding CPT copyright 2018 American Medical Association. All rights reserved. The codes documented in this report are preliminary and upon coder review  may  be revised to meet current compliance requirements. Lollie Sails, MD 07/22/2018 3:17:26 PM This report has been signed electronically. Number of  Addenda: 0 Note Initiated On: 07/22/2018 2:17 PM Scope Withdrawal Time: 0 hours 19 minutes 4 seconds  Total Procedure Duration: 0 hours 30 minutes 32 seconds       [facility]

## 2018-07-24 LAB — SURGICAL PATHOLOGY

## 2018-08-21 ENCOUNTER — Ambulatory Visit: Payer: Medicare Other | Admitting: Family

## 2018-08-25 ENCOUNTER — Other Ambulatory Visit: Payer: Self-pay

## 2018-08-25 MED ORDER — BUDESONIDE-FORMOTEROL FUMARATE 160-4.5 MCG/ACT IN AERO: 2.0000 | INHALATION_SPRAY | Freq: Two times a day (BID) | RESPIRATORY_TRACT | 1 refills | Status: DC

## 2018-08-25 NOTE — Progress Notes (Signed)
Faxed refill request received for Symbicort 160. Approved and sent to pharmacy.

## 2018-09-19 ENCOUNTER — Telehealth: Payer: Self-pay

## 2018-09-19 NOTE — Telephone Encounter (Signed)
   TELEPHONE CALL NOTE  This patient has been deemed a candidate for follow-up tele-health visit to limit community exposure during the Covid-19 pandemic. I spoke with the patient via phone to discuss instructions. The patient was advised to review the section on consent for treatment as well. The patient will receive a phone call 2-3 days prior to their E-Visit at which time consent will be verbally confirmed. A Virtual Office Visit appointment type has been scheduled for 09/22/2018 with [facility].  Gaylord Shih, [place] 09/19/2018 8:36 AM

## 2018-09-19 NOTE — Telephone Encounter (Signed)
TELEPHONE CALL NOTE  Anna Romero has been deemed a candidate for a follow-up tele-health visit to limit community exposure during the Covid-19 pandemic. I spoke with the patient via phone to ensure availability of phone/video source, confirm preferred email & phone number, discuss instructions and expectations, and review consent.   I reminded Anna Romero to be prepared with any vital sign and/or heart rhythm information that could potentially be obtained via home monitoring, at the time of her visit.  Finally, I reminded Anna Romero to expect an e-mail containing a link for their video-based visit approximately 15 minutes before her visit, or alternatively, a phone call at the time of her visit if her visit is planned to be a phone encounter.  Did the patient verbally consent to treatment as below? YES  Gaylord Shih, CMA 09/19/2018 8:37 AM  CONSENT FOR TELE-HEALTH VISIT - PLEASE REVIEW  I hereby voluntarily request, consent and authorize [facility] and its employed or contracted physicians, physician assistants, nurse practitioners or other licensed health care professionals (the Practitioner), to provide me with telemedicine health care services (the "Services") as deemed necessary by the treating Practitioner. I acknowledge and consent to receive the Services by the Practitioner via telemedicine. I understand that the telemedicine visit will involve communicating with the Practitioner through telephonic communication technology and the disclosure of certain medical information by electronic transmission. I acknowledge that I have been given the opportunity to request an in-person assessment or other available alternative prior to the telemedicine visit and am voluntarily participating in the telemedicine visit.  I understand that I have the right to withhold or withdraw my consent to the use of telemedicine in the course of my care at any time, without affecting my  right to future care or treatment, and that the Practitioner or I may terminate the telemedicine visit at any time. I understand that I have the right to inspect all information obtained and/or recorded in the course of the telemedicine visit and may receive copies of available information for a reasonable fee.  I understand that some of the potential risks of receiving the Services via telemedicine include:  Marland Kitchen Delay or interruption in medical evaluation due to technological equipment failure or disruption; . Information transmitted may not be sufficient (e.g. poor resolution of images) to allow for appropriate medical decision making by the Practitioner; and/or  . In rare instances, security protocols could fail, causing a breach of personal health information.  Furthermore, I acknowledge that it is my responsibility to provide information about my medical history, conditions and care that is complete and accurate to the best of my ability. I acknowledge that Practitioner's advice, recommendations, and/or decision may be based on factors not within their control, such as incomplete or inaccurate data provided by me or lack of visual representation. I understand that the practice of medicine is not an exact science and that Practitioner makes no warranties or guarantees regarding treatment outcomes. I acknowledge that I will receive a copy of this consent concurrently upon execution via email to the email address I last provided but may also request a printed copy by calling the office of [facility].    I understand that my insurance may be billed for this visit.   I have read or had this consent read to me. . I understand the contents of this consent, which adequately explains the benefits and risks of the Services being provided via telemedicine.  Marland Kitchen  I have been provided ample opportunity to ask questions regarding this consent and the Services and have had my questions answered to my  satisfaction. . I give my informed consent for the services to be provided through the use of telemedicine in my medical care  By participating in this telemedicine visit I agree to the above.

## 2018-09-22 ENCOUNTER — Other Ambulatory Visit: Payer: Self-pay

## 2018-09-22 ENCOUNTER — Ambulatory Visit: Payer: Medicare Other | Attending: Family | Admitting: Family

## 2018-09-22 ENCOUNTER — Encounter: Payer: Self-pay | Admitting: Family

## 2018-09-22 VITALS — Wt 163.0 lb

## 2018-09-22 DIAGNOSIS — I5022 Chronic systolic (congestive) heart failure: Secondary | ICD-10-CM

## 2018-09-22 DIAGNOSIS — F329 Major depressive disorder, single episode, unspecified: Secondary | ICD-10-CM

## 2018-09-22 DIAGNOSIS — I10 Essential (primary) hypertension: Secondary | ICD-10-CM
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
## 2018-09-22 NOTE — Patient Instructions (Signed)
Continue weighing daily and call for an overnight weight gain of > 2 pounds or a weekly weight gain of >5 pounds. 

## 2018-11-11 ENCOUNTER — Other Ambulatory Visit: Payer: Self-pay | Admitting: Obstetrics and Gynecology

## 2018-11-11 DIAGNOSIS — Z1231 Encounter for screening mammogram for malignant neoplasm of breast: Secondary | ICD-10-CM

## 2018-11-11 DIAGNOSIS — R921 Mammographic calcification found on diagnostic imaging of breast: Secondary | ICD-10-CM

## 2018-12-03 ENCOUNTER — Ambulatory Visit: Payer: Medicare Other | Admitting: Family

## 2018-12-15 ENCOUNTER — Telehealth: Payer: Self-pay | Admitting: Pulmonary Disease

## 2018-12-15 NOTE — Telephone Encounter (Signed)
Called patient for COVID-19 pre-screening for in office visit. ° °Have you recently traveled any where out of the local area in the last 2 weeks? No ° °Have you been in close contact with a person diagnosed with COVID-19 or someone awaiting results within the last 2 weeks? No ° °Do you currently have any of the following symptoms? If so, when did they start? °Cough     Diarrhea   Joint Pain °Fever      Muscle Pain   Red eyes °Shortness of breath   Abdominal pain  Vomiting °Loss of smell    Rash    Sore Throat °Headache    Weakness   Bruising or bleeding ° ° °Okay to proceed with visit 12/16/2018  ° ° °

## 2018-12-16 ENCOUNTER — Ambulatory Visit (INDEPENDENT_AMBULATORY_CARE_PROVIDER_SITE_OTHER): Payer: Medicare Other | Admitting: Pulmonary Disease

## 2018-12-16 ENCOUNTER — Other Ambulatory Visit: Payer: Self-pay

## 2018-12-16 ENCOUNTER — Encounter: Payer: Self-pay | Admitting: Pulmonary Disease

## 2018-12-16 VITALS — BP 130/72 | HR 53 | Temp 97.2°F | Ht 66.0 in | Wt 169.8 lb

## 2018-12-16 DIAGNOSIS — J439 Emphysema, unspecified: Secondary | ICD-10-CM | POA: Diagnosis not present

## 2018-12-16 DIAGNOSIS — J984 Other disorders of lung: Secondary | ICD-10-CM

## 2018-12-16 DIAGNOSIS — I2721 Secondary pulmonary arterial hypertension: Secondary | ICD-10-CM | POA: Diagnosis not present

## 2018-12-16 DIAGNOSIS — J4520 Mild intermittent asthma, uncomplicated: Secondary | ICD-10-CM

## 2018-12-16 MED ORDER — ALBUTEROL SULFATE HFA 108 (90 BASE) MCG/ACT IN AERS: 1.0000 | INHALATION_SPRAY | Freq: Four times a day (QID) | RESPIRATORY_TRACT | 5 refills | Status: DC | PRN

## 2018-12-16 NOTE — Patient Instructions (Signed)
Discontinue Symbicort inhaler Continue albuterol inhaler as needed for increased shortness of breath, chest tightness, wheezing, cough If you are requiring albuterol inhaler more than 3-4 times per week, we should consider resuming Symbicort inhaler  Follow-up in this office as needed for any new or increasing respiratory symptoms

## 2018-12-16 NOTE — Progress Notes (Signed)
PULMONARY OFFICE FOLLOW-UP NOTE  Requesting MD/Service: Raechel Ache Date of initial consultation: 11/19/17 Reason for consultation: H/O asthma, ? H/O OSA, PAH  PT PROFILE: 68 y.o. female never smoker with documented h/o OSA (though PSG in 2016 revealed no OSA), documented h/o asthma and PAH by echocardiogram. 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 Previously followed by Dr Raul Del  DATA: PFTs 10/06/14 Jefm Bryant): FVC 2.64  (110% predicted),  FEV1 1.71 L  (88% predicted), FEV1/FVC 64%, TLC (77% pred), DLCO (72% pred), DLCO/VA (102% pred) PSG 03/02/15: no significant apnea. PLM index 9.1, PLM arousal index 1.7 Echo 05/24/15: LVEF 40%, global HK, moderate MR, RVSP 45 mmHg CT chest 12/02/15: Left lower lobe scarring with associated pleural thickening and trace pleural effusion, chronic. No evidence of acute cardiopulmonary disease Echo 07/09/17: LVEF 40%, global HK, moderate MR, RVSP 43 mmHg Overnight oximetry 11/2017: No significant nocturnal desaturations PFTs 01/14/2018: FVC: 2.07 > 2.12 L (66 > 68 %pred), FEV1: 1.40 > 1.49 L (56 > 60 %pred), FEV1/FVC: 68%, TLC: 3.54 L (68 %pred), DLCO 41 %pred, DLCO/VA 102% predicted.  No significant response to bronchodilator except for 34% improvement in FEF 25-75%   INTERVAL: Last seen 01/16/18.  No major pulmonary events since that time   Subjective:  This is a belated scheduled follow-up.  Overall, she has had no major pulmonary problems since last evaluation almost a year ago.  She is a somewhat difficult historian and that she has difficulty distinguishing her inhalers from each other.  As far as I can tell, she is not using Symbicort on a scheduled basis as it was prescribed.  She is using it "as needed" and last time she used it was approximately 3 weeks ago.  However, at one point, she indicated that she was actually using her albuterol inhaler as needed.  Nonetheless, she has not used any inhalers in approximately 3  weeks.  She denies dyspnea, chest pain, fever, cough, purulent sputum, lower extremity edema, calf tenderness.  On a daily basis without significant limitation.    Vitals:   12/16/18 1348  BP: 130/72  Pulse: (!) 53  Temp: (!) 97.2 F (36.2 C)  TempSrc: Skin  SpO2: 99%  Weight: 169 lb 12.8 oz (77 kg)  Height: 5\' 6"  (1.676 m)  RA   EXAM:  Gen: NAD HEENT: NCAT, sclerae white Neck: No JVD Lungs: breath sounds full, no wheezes or other adventitious sounds Cardiovascular: RRR, no murmurs Abdomen: Soft, nontender, normal BS Ext: without clubbing, cyanosis, edema Neuro: grossly intact Skin: Limited exam, no lesions noted    DATA:   BMP Latest Ref Rng & Units 01/26/2014 10/13/2013 12/08/2012  Glucose 65 - 99 mg/dL 85 107(H) 91  BUN 7 - 18 mg/dL 22(H) 37(H) 18  Creatinine 0.60 - 1.30 mg/dL 1.11 1.52(H) 1.15  Sodium 136 - 145 mmol/L 140 140 139  Potassium 3.5 - 5.1 mmol/L 3.8 4.7 4.3  Chloride 98 - 107 mmol/L 107 108(H) 103  CO2 21 - 32 mmol/L 27 27 33(H)  Calcium 8.5 - 10.1 mg/dL 9.1 8.9 9.7    CBC Latest Ref Rng & Units 01/26/2014 10/13/2013 03/11/2012  WBC 3.6 - 11.0 x10 3/mm 3 7.4 8.6 10.9  Hemoglobin 12.0 - 16.0 g/dL 13.1 12.9 14.2  Hematocrit 35.0 - 47.0 % 39.7 39.5 42.9  Platelets 150 - 440 x10 3/mm 3 182 162 211    CXR: No recent film  I have personally reviewed all chest radiographs reported above including CXRs  and CT chest unless otherwise indicated  IMPRESSION:     ICD-10-CM   1. Mixed restrictive and obstructive lung disease ([place])  J43.9    J98.4   2. Mild persistent asthma without complication  T70.01   3. Mild-mod PAH by echocardiogram - group 2 (due to L heart disease)  I27.21     PLAN:  1) I suggested that she may discontinue Symbicort inhaler altogether as she is not using it on a regular basis.   2) continue albuterol inhaler as needed for increased shortness of breath, chest tightness, wheezing, cough 3) if she requires albuterol more than 3-4 times  per week, we should consider resuming Symbicort inhaler 4) I encouraged that she get her flu shot this year when available 5) follow-up in this office as needed for any new or increasing respiratory or chest symptoms   Merton Border, MD PCCM service Mobile [phone] Pager [phone] 12/16/2018 1:50 PM

## 2018-12-18 NOTE — Progress Notes (Signed)
Patient ID: Anna Romero, female    DOB: Oct 19, 1950, 68 y.o.   MRN: [id number]  HPI  Anna Romero is a 68 y/o female with a history of hyperlipidemia, HTN, CKD, pulmonary HTN, depression, GERD, anxiety and chronic heart failure.   Echo report from 07/09/17 reviewed and showed an EF of 40% along with moderate MR/TR.  She has not been admitted or been in the ED in the last 6 months.    She presents today for a follow-up visit with a chief complaint of minimal shortness of breath upon moderate exertion. She describes this as chronic in nature having been present for several years. She has associated fatigue, depression, anxiety and difficulty sleeping along with this. She denies any dizziness, abdominal distention, palpitations, pedal edema, chest pain, cough or weight gain. Continues to follow with therapist and psychiatrist.   Past Medical History:  Diagnosis Date  . Anxiety   . CHF (congestive heart failure) ([place])   . Chronic kidney disease    Renal Insufficiency Stage 3  . Degenerative disc disease, cervical   . Depression   . GERD (gastroesophageal reflux disease)   . Hyperlipidemia   . Hypertension   . Mixed restrictive and obstructive lung disease ([place])    Predominantly restriction due to chronic volume loss in the left lung.  Mild small airways disease.  . Moderate mitral insufficiency   . Monoclonal gammopathy   . Polymyositis ([place])   . Pulmonary hypertension ([place])    Past Surgical History:  Procedure Laterality Date  . APPENDECTOMY    . BREAST EXCISIONAL BIOPSY Left 1990   negative  . CATARACT EXTRACTION    . COLONOSCOPY N/A 08/27/2016   Procedure: COLONOSCOPY;  Surgeon: Lollie Sails, MD;  Location: [facility] ENDOSCOPY;  Service: Endoscopy;  Laterality: N/A;  . COLONOSCOPY WITH PROPOFOL N/A 07/22/2018   Procedure: COLONOSCOPY WITH PROPOFOL;  Surgeon: Lollie Sails, MD;  Location: [facility] ENDOSCOPY;  Service: Endoscopy;  Laterality: N/A;  . REPLACEMENT TOTAL HIP W/   RESURFACING IMPLANTS Right   . THYMECTOMY     Family History  Adopted: Yes  Problem Relation Age of Onset  . Breast cancer Neg Hx    Social History   Tobacco Use  . Smoking status: Never Smoker  . Smokeless tobacco: Never Used  Substance Use Topics  . Alcohol use: No   Allergies  Allergen Reactions  . Hctz [Hydrochlorothiazide] Other (See Comments)    Kidney Disorder   Prior to Admission medications   Medication Sig Start Date End Date Taking? Authorizing Provider  acetaminophen (TYLENOL) 500 MG tablet Take by mouth.   Yes [provider]  albuterol (VENTOLIN HFA) 108 (90 Base) MCG/ACT inhaler Inhale 1-2 puffs into the lungs every 6 (six) hours as needed for wheezing or shortness of breath. 12/16/18  Yes Wilhelmina Mcardle, MD  buPROPion (WELLBUTRIN XL) 150 MG 24 hr tablet Take 150 mg by mouth daily.  05/08/17  Yes [provider]  carvedilol (COREG) 12.5 MG tablet Take 12.5 mg by mouth 2 (two) times daily with a meal.   Yes [provider]  Cholecalciferol (VITAMIN D3) 1000 units CAPS Take by mouth.   Yes [provider]  clonazePAM (KLONOPIN) 0.5 MG tablet Take 0.5 mg by mouth 2 (two) times daily as needed for anxiety.   Yes [provider]  donepezil (ARICEPT) 10 MG tablet Take 10 mg by mouth at bedtime.   Yes [provider]  furosemide (LASIX) 20 MG tablet  Take 20 mg by mouth.   Yes [provider]  Multiple Vitamins-Minerals (MULTIVITAMIN WITH MINERALS) tablet Take 1 tablet by mouth daily.   Yes [provider]  omeprazole (PRILOSEC) 20 MG capsule Take 20 mg by mouth daily.   Yes [provider]  spironolactone (ALDACTONE) 25 MG tablet Take 25 mg by mouth daily.   Yes [provider]  telmisartan (MICARDIS) 40 MG tablet Take 40 mg by mouth daily.   Yes [provider]  traZODone (DESYREL) 100 MG tablet Take 1-2 tablets by mouth as needed. 05/24/18  Yes [provider]      Review of Systems  Constitutional: Positive for fatigue. Negative for appetite change.  HENT: Positive for congestion and rhinorrhea. Negative for sore throat.   Eyes: Negative.   Respiratory: Positive for shortness of breath (minimal). Negative for chest tightness.   Cardiovascular: Negative for chest pain, palpitations and leg swelling.  Gastrointestinal: Negative for abdominal distention and abdominal pain.  Endocrine: Negative.   Genitourinary: Negative.   Musculoskeletal: Positive for back pain (chronic pain) and neck pain (at times).  Skin: Negative.   Allergic/Immunologic: Negative.   Neurological: Negative for dizziness and light-headedness.       Memory loss pseudodementia  Hematological: Negative for adenopathy. Does not bruise/bleed easily.  Psychiatric/Behavioral: Positive for dysphoric mood and sleep disturbance (not sleeping well; sleeping on 1 pillow). The patient is nervous/anxious.    Vitals:   12/19/18 1247  BP: (!) 148/73  Pulse: (!) 50  Resp: 18  SpO2: 99%  Weight: 168 lb 2 oz (76.3 kg)  Height: 5\' 6"  (1.676 m)   Wt Readings from Last 3 Encounters:  12/19/18 168 lb 2 oz (76.3 kg)  12/16/18 169 lb 12.8 oz (77 kg)  09/22/18 163 lb (73.9 kg)   Lab Results  Component Value Date   CREATININE 1.11 01/26/2014   CREATININE 1.52 (H) 10/13/2013   CREATININE 1.15 12/08/2012    Physical Exam Vitals signs and nursing note reviewed.  Constitutional:      Appearance: She is well-developed.  HENT:     Head: Normocephalic and atraumatic.  Neck:     Musculoskeletal: Normal range of motion and neck supple.     Vascular: No JVD.  Cardiovascular:     Rate and Rhythm: Regular rhythm. Bradycardia present.  Pulmonary:     Effort: Pulmonary effort is normal. No respiratory distress.     Breath sounds: No wheezing or rales.  Abdominal:     Tenderness: There is no abdominal tenderness.  Musculoskeletal:     Right lower leg: She exhibits no tenderness. No  edema.     Left lower leg: She exhibits no tenderness. No edema.  Skin:    General: Skin is warm and dry.  Neurological:     Mental Status: She is alert and oriented to person, place, and time.  Psychiatric:        Mood and Affect: Mood normal.        Behavior: Behavior normal.    Assessment & Plan:  1: Chronic heart failure with mildly reduced ejection fraction- - NYHA class II - euvolemic today - weighing daily and she was reminded to call for an overnight weight gain of >2 pounds or a weekly weight gain of >5 pounds - weight down 6 pounds from last visit here 5 months ago - not adding salt to her food and has been reading food labels - saw cardiology Lyndel Pleasure) 12/02/17 - saw pulmonology Alva Garnet) 12/16/2018 -  BNP 03/11/12 was 143  2: HTN- - BP mildly elevated today but she says that she was rushing to get here because she thought she was going to be late - saw PCP Raechel Ache) 10/09/2018 - BMP from 04/08/18 reviewed and showed sodium 142, potassium 4.3, creatinine 1.0 and GFR 67  3: Depression- - patient says that she's still seeing a counselor along with her psychiatrist - 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  Return in 6 months or sooner for any questions/problems before then.

## 2018-12-19 ENCOUNTER — Ambulatory Visit: Payer: Medicare Other | Attending: Family | Admitting: Family

## 2018-12-19 ENCOUNTER — Encounter: Payer: Self-pay | Admitting: Family

## 2018-12-19 ENCOUNTER — Other Ambulatory Visit: Payer: Self-pay

## 2018-12-19 VITALS — BP 148/73 | HR 50 | Resp 18 | Ht 66.0 in | Wt 168.1 lb

## 2018-12-19 DIAGNOSIS — I130 Hypertensive heart and chronic kidney disease with heart failure and stage 1 through stage 4 chronic kidney disease, or unspecified chronic kidney disease: Secondary | ICD-10-CM | POA: Diagnosis not present

## 2018-12-19 DIAGNOSIS — I10 Essential (primary) hypertension: Secondary | ICD-10-CM

## 2018-12-19 DIAGNOSIS — F329 Major depressive disorder, single episode, unspecified: Secondary | ICD-10-CM | POA: Insufficient documentation

## 2018-12-19 DIAGNOSIS — N183 Chronic kidney disease, stage 3 (moderate): Secondary | ICD-10-CM | POA: Diagnosis not present

## 2018-12-19 DIAGNOSIS — I5022 Chronic systolic (congestive) heart failure: Secondary | ICD-10-CM | POA: Insufficient documentation

## 2018-12-19 DIAGNOSIS — Z79899 Other long term (current) drug therapy: Secondary | ICD-10-CM | POA: Insufficient documentation

## 2018-12-19 DIAGNOSIS — I272 Pulmonary hypertension, unspecified: Secondary | ICD-10-CM | POA: Diagnosis not present

## 2018-12-19 DIAGNOSIS — Z888 Allergy status to other drugs, medicaments and biological substances status: Secondary | ICD-10-CM | POA: Diagnosis not present

## 2018-12-19 DIAGNOSIS — F419 Anxiety disorder, unspecified: Secondary | ICD-10-CM | POA: Diagnosis not present

## 2018-12-19 DIAGNOSIS — I509 Heart failure, unspecified: Secondary | ICD-10-CM | POA: Diagnosis present

## 2018-12-19 DIAGNOSIS — E785 Hyperlipidemia, unspecified: Secondary | ICD-10-CM | POA: Insufficient documentation

## 2018-12-19 DIAGNOSIS — Z96641 Presence of right artificial hip joint: Secondary | ICD-10-CM | POA: Diagnosis not present

## 2018-12-19 DIAGNOSIS — K219 Gastro-esophageal reflux disease without esophagitis: Secondary | ICD-10-CM | POA: Insufficient documentation

## 2018-12-19 NOTE — Patient Instructions (Addendum)
Continue weighing daily and call for an overnight weight gain of > 2 pounds or a weekly weight gain of >5 pounds.   Low-Sodium Eating Plan Sodium, which is an element that makes up salt, helps you maintain a healthy balance of fluids in your body. Too much sodium can increase your blood pressure and cause fluid and waste to be held in your body. Your health care provider or dietitian may recommend following this plan if you have high blood pressure (hypertension), kidney disease, liver disease, or heart failure. Eating less sodium can help lower your blood pressure, reduce swelling, and protect your heart, liver, and kidneys. What are tips for following this plan? General guidelines  Most people on this plan should limit their sodium intake to 2,000 mg (milligrams) of sodium each day. Reading food labels   The Nutrition Facts label lists the amount of sodium in one serving of the food. If you eat more than one serving, you must multiply the listed amount of sodium by the number of servings.  Choose foods with less than 140 mg of sodium per serving.  Avoid foods with 300 mg of sodium or more per serving. Shopping  Look for lower-sodium products, often labeled as "low-sodium" or "no salt added."  Always check the sodium content even if foods are labeled as "unsalted" or "no salt added".  Buy fresh foods. ? Avoid canned foods and premade or frozen meals. ? Avoid canned, cured, or processed meats  Buy breads that have less than 80 mg of sodium per slice. Cooking  Eat more home-cooked food and less restaurant, buffet, and fast food.  Avoid adding salt when cooking. Use salt-free seasonings or herbs instead of table salt or sea salt. Check with your health care provider or pharmacist before using salt substitutes.  Cook with plant-based oils, such as canola, sunflower, or olive oil. Meal planning  When eating at a restaurant, ask that your food be prepared with less salt or no salt,  if possible.  Avoid foods that contain MSG (monosodium glutamate). MSG is sometimes added to Chinese food, bouillon, and some canned foods. What foods are recommended? The items listed may not be a complete list. Talk with your dietitian about what dietary choices are best for you. Grains Low-sodium cereals, including oats, puffed wheat and rice, and shredded wheat. Low-sodium crackers. Unsalted rice. Unsalted pasta. Low-sodium bread. Whole-grain breads and whole-grain pasta. Vegetables Fresh or frozen vegetables. "No salt added" canned vegetables. "No salt added" tomato sauce and paste. Low-sodium or reduced-sodium tomato and vegetable juice. Fruits Fresh, frozen, or canned fruit. Fruit juice. Meats and other protein foods Fresh or frozen (no salt added) meat, poultry, seafood, and fish. Low-sodium canned tuna and salmon. Unsalted nuts. Dried peas, beans, and lentils without added salt. Unsalted canned beans. Eggs. Unsalted nut butters. Dairy Milk. Soy milk. Cheese that is naturally low in sodium, such as ricotta cheese, fresh mozzarella, or Swiss cheese Low-sodium or reduced-sodium cheese. Cream cheese. Yogurt. Fats and oils Unsalted butter. Unsalted margarine with no trans fat. Vegetable oils such as canola or olive oils. Seasonings and other foods Fresh and dried herbs and spices. Salt-free seasonings. Low-sodium mustard and ketchup. Sodium-free salad dressing. Sodium-free light mayonnaise. Fresh or refrigerated horseradish. Lemon juice. Vinegar. Homemade, reduced-sodium, or low-sodium soups. Unsalted popcorn and pretzels. Low-salt or salt-free chips. What foods are not recommended? The items listed may not be a complete list. Talk with your dietitian about what dietary choices are best for you. Grains Instant hot   cereals. Bread stuffing, pancake, and biscuit mixes. Croutons. Seasoned rice or pasta mixes. Noodle soup cups. Boxed or frozen macaroni and cheese. Regular salted crackers.  Self-rising flour. Vegetables Sauerkraut, pickled vegetables, and relishes. Olives. [place] fries. Onion rings. Regular canned vegetables (not low-sodium or reduced-sodium). Regular canned tomato sauce and paste (not low-sodium or reduced-sodium). Regular tomato and vegetable juice (not low-sodium or reduced-sodium). Frozen vegetables in sauces. Meats and other protein foods Meat or fish that is salted, canned, smoked, spiced, or pickled. Bacon, ham, sausage, hotdogs, corned beef, chipped beef, packaged lunch meats, salt pork, jerky, pickled herring, anchovies, regular canned tuna, sardines, salted nuts. Dairy Processed cheese and cheese spreads. Cheese curds. Blue cheese. Feta cheese. String cheese. Regular cottage cheese. Buttermilk. Canned milk. Fats and oils Salted butter. Regular margarine. Ghee. Bacon fat. Seasonings and other foods Onion salt, garlic salt, seasoned salt, table salt, and sea salt. Canned and packaged gravies. Worcestershire sauce. Tartar sauce. Barbecue sauce. Teriyaki sauce. Soy sauce, including reduced-sodium. Steak sauce. Fish sauce. Oyster sauce. Cocktail sauce. Horseradish that you find on the shelf. Regular ketchup and mustard. Meat flavorings and tenderizers. Bouillon cubes. Hot sauce and Tabasco sauce. Premade or packaged marinades. Premade or packaged taco seasonings. Relishes. Regular salad dressings. Salsa. Potato and tortilla chips. Corn chips and puffs. Salted popcorn and pretzels. Canned or dried soups. Pizza. Frozen entrees and pot pies. Summary  Eating less sodium can help lower your blood pressure, reduce swelling, and protect your heart, liver, and kidneys.  Most people on this plan should limit their sodium intake to 1,500-2,000 mg (milligrams) of sodium each day.  Canned, boxed, and frozen foods are high in sodium. Restaurant foods, fast foods, and pizza are also very high in sodium. You also get sodium by adding salt to food.  Try to cook at home, eat  more fresh fruits and vegetables, and eat less fast food, canned, processed, or prepared foods. This information is not intended to replace advice given to you by your health care provider. Make sure you discuss any questions you have with your health care provider. Document Released: 10/27/2001 Document Revised: 04/19/2017 Document Reviewed: 04/30/2016 Elsevier Patient Education  2020 [employer].

## 2018-12-29 ENCOUNTER — Other Ambulatory Visit: Payer: Self-pay

## 2018-12-29 ENCOUNTER — Ambulatory Visit (HOSPITAL_COMMUNITY): Payer: Medicare Other | Admitting: Licensed Clinical Social Worker

## 2019-01-14 ENCOUNTER — Ambulatory Visit: Payer: Medicare Other | Admitting: Licensed Clinical Social Worker

## 2019-01-19 ENCOUNTER — Other Ambulatory Visit: Payer: Self-pay

## 2019-01-19 ENCOUNTER — Encounter (HOSPITAL_COMMUNITY): Payer: Self-pay | Admitting: Licensed Clinical Social Worker

## 2019-01-19 ENCOUNTER — Ambulatory Visit (INDEPENDENT_AMBULATORY_CARE_PROVIDER_SITE_OTHER): Payer: Medicare Other | Admitting: Licensed Clinical Social Worker

## 2019-01-19 DIAGNOSIS — F331 Major depressive disorder, recurrent, moderate: Secondary | ICD-10-CM | POA: Diagnosis not present
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
## 2019-02-23 ENCOUNTER — Other Ambulatory Visit: Payer: Self-pay | Admitting: Pulmonary Disease

## 2019-04-03 ENCOUNTER — Ambulatory Visit: Payer: Medicare Other

## 2019-06-09 ENCOUNTER — Ambulatory Visit
Admission: RE | Admit: 2019-06-09 | Discharge: 2019-06-09 | Disposition: A | Discharge status: Home / Self Care | Payer: Medicare Other | Source: Ambulatory Visit | Attending: Obstetrics and Gynecology | Admitting: Obstetrics and Gynecology

## 2019-06-09 DIAGNOSIS — R921 Mammographic calcification found on diagnostic imaging of breast: Secondary | ICD-10-CM | POA: Insufficient documentation

## 2019-06-09 DIAGNOSIS — Z1231 Encounter for screening mammogram for malignant neoplasm of breast: Secondary | ICD-10-CM | POA: Insufficient documentation

## 2019-06-19 ENCOUNTER — Ambulatory Visit: Payer: Medicare Other | Admitting: Family

## 2019-07-16 ENCOUNTER — Ambulatory Visit: Payer: Medicare Other | Attending: Internal Medicine

## 2019-07-16 DIAGNOSIS — Z23 Encounter for immunization: Secondary | ICD-10-CM | POA: Insufficient documentation

## 2019-07-16 NOTE — Progress Notes (Signed)
   Covid-19 Vaccination Clinic  Name:  Anna Romero    MRN: [id number] DOB: 1950-08-31  07/16/2019  [name] was observed post Covid-19 immunization for 15 minutes without incidence. She was provided with Vaccine Information Sheet and instruction to access the V-Safe system.   Ms. Sole was instructed to call 911 with any severe reactions post vaccine: Marland Kitchen Difficulty breathing  . Swelling of your face and throat  . A fast heartbeat  . A bad rash all over your body  . Dizziness and weakness    Immunizations Administered    Name Date Dose VIS Date Route   Pfizer COVID-19 Vaccine 07/16/2019 12:31 PM 0.3 mL 05/01/2019 Intramuscular   Manufacturer: [employer]   Lot: [id number]   [place]: [id number]

## 2019-07-17 ENCOUNTER — Ambulatory Visit: Payer: Medicare Other | Admitting: Pulmonary Disease

## 2019-07-20 ENCOUNTER — Encounter: Payer: Medicare Other | Attending: Internal Medicine

## 2019-07-20 DIAGNOSIS — I272 Pulmonary hypertension, unspecified: Secondary | ICD-10-CM | POA: Insufficient documentation

## 2019-07-20 DIAGNOSIS — I130 Hypertensive heart and chronic kidney disease with heart failure and stage 1 through stage 4 chronic kidney disease, or unspecified chronic kidney disease: Secondary | ICD-10-CM | POA: Insufficient documentation

## 2019-07-20 DIAGNOSIS — N183 Chronic kidney disease, stage 3 unspecified: Secondary | ICD-10-CM | POA: Insufficient documentation

## 2019-07-20 DIAGNOSIS — I5022 Chronic systolic (congestive) heart failure: Secondary | ICD-10-CM | POA: Insufficient documentation

## 2019-07-20 DIAGNOSIS — E785 Hyperlipidemia, unspecified: Secondary | ICD-10-CM | POA: Insufficient documentation

## 2019-07-20 DIAGNOSIS — D472 Monoclonal gammopathy: Secondary | ICD-10-CM | POA: Insufficient documentation

## 2019-07-20 DIAGNOSIS — F329 Major depressive disorder, single episode, unspecified: Secondary | ICD-10-CM | POA: Insufficient documentation

## 2019-07-20 DIAGNOSIS — F419 Anxiety disorder, unspecified: Secondary | ICD-10-CM | POA: Insufficient documentation

## 2019-07-20 DIAGNOSIS — Z79899 Other long term (current) drug therapy: Secondary | ICD-10-CM | POA: Insufficient documentation

## 2019-07-21 ENCOUNTER — Other Ambulatory Visit: Payer: Self-pay

## 2019-07-21 VITALS — Ht 65.5 in | Wt 182.6 lb

## 2019-07-21 DIAGNOSIS — E785 Hyperlipidemia, unspecified: Secondary | ICD-10-CM | POA: Diagnosis not present

## 2019-07-21 DIAGNOSIS — I5022 Chronic systolic (congestive) heart failure: Secondary | ICD-10-CM

## 2019-07-21 DIAGNOSIS — N183 Chronic kidney disease, stage 3 unspecified: Secondary | ICD-10-CM | POA: Diagnosis not present

## 2019-07-21 DIAGNOSIS — I130 Hypertensive heart and chronic kidney disease with heart failure and stage 1 through stage 4 chronic kidney disease, or unspecified chronic kidney disease: Secondary | ICD-10-CM | POA: Diagnosis not present

## 2019-07-21 DIAGNOSIS — Z79899 Other long term (current) drug therapy: Secondary | ICD-10-CM | POA: Diagnosis not present

## 2019-07-21 DIAGNOSIS — I272 Pulmonary hypertension, unspecified: Secondary | ICD-10-CM | POA: Diagnosis not present

## 2019-07-21 DIAGNOSIS — D472 Monoclonal gammopathy: Secondary | ICD-10-CM | POA: Diagnosis not present

## 2019-07-21 DIAGNOSIS — F419 Anxiety disorder, unspecified: Secondary | ICD-10-CM | POA: Diagnosis not present

## 2019-07-21 DIAGNOSIS — F329 Major depressive disorder, single episode, unspecified: Secondary | ICD-10-CM | POA: Diagnosis not present

## 2019-07-21 NOTE — Progress Notes (Signed)
Pulmonary Individual Treatment Plan  Patient Details  Name: Anna Romero MRN: [id number] Date of Birth: Oct 06, 1950 Referring Provider:     Pulmonary Rehab from 07/21/2019 in [facility] Cardiac and Pulmonary Rehab  Referring Provider  Nehemiah Massed      Initial Encounter Date:    Pulmonary Rehab from 07/21/2019 in [facility] Cardiac and Pulmonary Rehab  Date  07/21/19      Visit Diagnosis: Chronic systolic CHF (congestive heart failure), NYHA class 2 ([place])  Patient's Home Medications on Admission:  Current Outpatient Medications:  .  acetaminophen (TYLENOL) 500 MG tablet, Take by mouth., Disp: , Rfl:  .  albuterol (VENTOLIN HFA) 108 (90 Base) MCG/ACT inhaler, Inhale 1-2 puffs into the lungs every 6 (six) hours as needed for wheezing or shortness of breath., Disp: 18 g, Rfl: 5 .  buPROPion (WELLBUTRIN XL) 150 MG 24 hr tablet, Take 150 mg by mouth daily. , Disp: , Rfl: 3 .  carvedilol (COREG) 12.5 MG tablet, Take 12.5 mg by mouth 2 (two) times daily with a meal., Disp: , Rfl:  .  Cholecalciferol (VITAMIN D3) 1000 units CAPS, Take by mouth., Disp: , Rfl:  .  clonazePAM (KLONOPIN) 0.5 MG tablet, Take 0.5 mg by mouth 2 (two) times daily as needed for anxiety., Disp: , Rfl:  .  donepezil (ARICEPT) 10 MG tablet, Take 10 mg by mouth at bedtime., Disp: , Rfl:  .  furosemide (LASIX) 20 MG tablet, Take 20 mg by mouth., Disp: , Rfl:  .  Multiple Vitamins-Minerals (MULTIVITAMIN WITH MINERALS) tablet, Take 1 tablet by mouth daily., Disp: , Rfl:  .  spironolactone (ALDACTONE) 25 MG tablet, Take 25 mg by mouth daily., Disp: , Rfl:  .  telmisartan (MICARDIS) 40 MG tablet, Take 40 mg by mouth daily., Disp: , Rfl:  .  traZODone (DESYREL) 100 MG tablet, Take 1-2 tablets by mouth as needed., Disp: , Rfl:  .  omeprazole (PRILOSEC) 20 MG capsule, Take 20 mg by mouth daily., Disp: , Rfl:   Past Medical History: Past Medical History:  Diagnosis Date  . Anxiety   . CHF (congestive heart failure) ([place])   . Chronic kidney  disease    Renal Insufficiency Stage 3  . Degenerative disc disease, cervical   . Depression   . GERD (gastroesophageal reflux disease)   . Hyperlipidemia   . Hypertension   . Mixed restrictive and obstructive lung disease ([place])    Predominantly restriction due to chronic volume loss in the left lung.  Mild small airways disease.  . Moderate mitral insufficiency   . Monoclonal gammopathy   . Polymyositis ([place])   . Pulmonary hypertension (HCC)     Tobacco Use: Social History   Tobacco Use  Smoking Status Never Smoker  Smokeless Tobacco Never Used    Labs: Recent Review Flowsheet Data    There is no flowsheet data to display.       Pulmonary Assessment Scores:   UCSD: Self-administered rating of dyspnea associated with activities of daily living (ADLs) 6-point scale (0 = "not at all" to 5 = "maximal or unable to do because of breathlessness")  Scoring Scores range from 0 to 120.  Minimally important difference is 5 units  CAT: CAT can identify the health impairment of COPD patients and is better correlated with disease progression.  CAT has a scoring range of zero to 40. The CAT score is classified into four groups of low (less than 10), medium (10 - 20), high (21-30) and very high (31-40)  based on the impact level of disease on health status. A CAT score over 10 suggests significant symptoms.  A worsening CAT score could be explained by an exacerbation, poor medication adherence, poor inhaler technique, or progression of COPD or comorbid conditions.  CAT MCID is 2 points  mMRC: mMRC (Modified Medical Research Council) Dyspnea Scale is used to assess the degree of baseline functional disability in patients of respiratory disease due to dyspnea. No minimal important difference is established. A decrease in score of 1 point or greater is considered a positive change.   Pulmonary Function Assessment:   Exercise Target Goals: Exercise Program Goal: Individual exercise  prescription set using results from initial 6 min walk test and THRR while considering  patient's activity barriers and safety.   Exercise Prescription Goal: Initial exercise prescription builds to 30-45 minutes a day of aerobic activity, 2-3 days per week.  Home exercise guidelines will be given to patient during program as part of exercise prescription that the participant will acknowledge.  Activity Barriers & Risk Stratification: Activity Barriers & Cardiac Risk Stratification - 07/21/19 1351      Activity Barriers & Cardiac Risk Stratification   Activity Barriers  Right Hip Replacement       6 Minute Walk: 6 Minute Walk    Row Name 07/21/19 1421         6 Minute Walk   Phase  Initial     Distance  1250 feet     Walk Time  6 minutes     # of Rest Breaks  0     MPH  2.36     METS  2.52     RPE  13     Perceived Dyspnea   1     VO2 Peak  8.8     Symptoms  No     Resting HR  57 bpm     Resting BP  128/74     Resting Oxygen Saturation   97 %     Exercise Oxygen Saturation  during 6 min walk  89 %     Max Ex. HR  83 bpm     Max Ex. BP  124/66     2 Minute Post BP  128/64       Interval HR   1 Minute HR  64     2 Minute HR  67     3 Minute HR  68     4 Minute HR  79     5 Minute HR  83     6 Minute HR  78     2 Minute Post HR  67     Interval Heart Rate?  Yes       Interval Oxygen   Interval Oxygen?  Yes     Baseline Oxygen Saturation %  97 %     1 Minute Oxygen Saturation %  95 %     1 Minute Liters of Oxygen  0 L     2 Minute Oxygen Saturation %  93 %     2 Minute Liters of Oxygen  0 L     3 Minute Oxygen Saturation %  89 %     3 Minute Liters of Oxygen  0 L     4 Minute Oxygen Saturation %  89 %     4 Minute Liters of Oxygen  0 L     5 Minute Oxygen Saturation %  91 %  5 Minute Liters of Oxygen  0 L     6 Minute Oxygen Saturation %  91 %     6 Minute Liters of Oxygen  0 L     2 Minute Post Oxygen Saturation %  94 %     2 Minute Post Liters of Oxygen   0 L       Oxygen Initial Assessment: Oxygen Initial Assessment - 07/21/19 1453      Home Oxygen   Home Oxygen Device  None    Sleep Oxygen Prescription  CPAP    Home Exercise Oxygen Prescription  None    Home at Rest Exercise Oxygen Prescription  None    Compliance with Home Oxygen Use  Yes      Initial 6 min Walk   Oxygen Used  None      Program Oxygen Prescription   Program Oxygen Prescription  None      Intervention   Short Term Goals  To learn and exhibit compliance with exercise, home and travel O2 prescription;To learn and understand importance of monitoring SPO2 with pulse oximeter and demonstrate accurate use of the pulse oximeter.;To learn and understand importance of maintaining oxygen saturations>88%;To learn and demonstrate proper pursed lip breathing techniques or other breathing techniques.;To learn and demonstrate proper use of respiratory medications    Long  Term Goals  Exhibits compliance with exercise, home and travel O2 prescription;Verbalizes importance of monitoring SPO2 with pulse oximeter and return demonstration;Maintenance of O2 saturations>88%;Exhibits proper breathing techniques, such as pursed lip breathing or other method taught during program session;Compliance with respiratory medication;Demonstrates proper use of MDI's       Oxygen Re-Evaluation:   Oxygen Discharge (Final Oxygen Re-Evaluation):   Initial Exercise Prescription: Initial Exercise Prescription - 07/21/19 1400      Date of Initial Exercise RX and Referring Provider   Date  07/21/19    Referring Provider  Nehemiah Massed      Treadmill   MPH  2  (Pended)     Grade  0  (Pended)     Minutes  15  (Pended)     METs  2.5  (Pended)       Recumbant Bike   Level  1  (Pended)     RPM  60  (Pended)     Minutes  15  (Pended)     METs  2.5  (Pended)       NuStep   Level  2  (Pended)     SPM  80  (Pended)     Minutes  15  (Pended)     METs  2.5  (Pended)       REL-XR   Level  2   (Pended)     Speed  50  (Pended)     Minutes  15  (Pended)     METs  2.5  (Pended)       Prescription Details   Frequency (times per week)  3  (Pended)     Duration  Progress to 30 minutes of continuous aerobic without signs/symptoms of physical distress  (Pended)       Intensity   THRR 40-80% of Max Heartrate  95-132  (Pended)     Ratings of Perceived Exertion  11-15  (Pended)     Perceived Dyspnea  0-4  (Pended)       Resistance Training   Training Prescription  Yes  (Pended)        Perform Capillary Blood Glucose checks  as needed.  Exercise Prescription Changes: Exercise Prescription Changes    Row Name 07/21/19 1400             Response to Exercise   Blood Pressure (Admit)  128/74       Blood Pressure (Exercise)  124/66       Blood Pressure (Exit)  128/64       Heart Rate (Admit)  57 bpm       Heart Rate (Exercise)  83 bpm       Heart Rate (Exit)  67 bpm       Oxygen Saturation (Admit)  97 %       Oxygen Saturation (Exercise)  89 %       Oxygen Saturation (Exit)  94 %       Rating of Perceived Exertion (Exercise)  13       Perceived Dyspnea (Exercise)  1       Symptoms  none          Exercise Comments:   Exercise Goals and Review: Exercise Goals    Row Name 07/21/19 1451             Exercise Goals   Increase Physical Activity  Yes       Intervention  Provide advice, education, support and counseling about physical activity/exercise needs.;Develop an individualized exercise prescription for aerobic and resistive training based on initial evaluation findings, risk stratification, comorbidities and participant's personal goals.       Expected Outcomes  Short Term: Attend rehab on a regular basis to increase amount of physical activity.;Long Term: Add in home exercise to make exercise part of routine and to increase amount of physical activity.;Long Term: Exercising regularly at least 3-5 days a week.       Increase Strength and Stamina  Yes        Intervention  Provide advice, education, support and counseling about physical activity/exercise needs.;Develop an individualized exercise prescription for aerobic and resistive training based on initial evaluation findings, risk stratification, comorbidities and participant's personal goals.       Expected Outcomes  Short Term: Increase workloads from initial exercise prescription for resistance, speed, and METs.;Short Term: Perform resistance training exercises routinely during rehab and add in resistance training at home;Long Term: Improve cardiorespiratory fitness, muscular endurance and strength as measured by increased METs and functional capacity (6MWT)       Able to understand and use rate of perceived exertion (RPE) scale  Yes       Intervention  Provide education and explanation on how to use RPE scale       Expected Outcomes  Short Term: Able to use RPE daily in rehab to express subjective intensity level;Long Term:  Able to use RPE to guide intensity level when exercising independently       Able to understand and use Dyspnea scale  Yes       Intervention  Provide education and explanation on how to use Dyspnea scale       Expected Outcomes  Short Term: Able to use Dyspnea scale daily in rehab to express subjective sense of shortness of breath during exertion;Long Term: Able to use Dyspnea scale to guide intensity level when exercising independently       Knowledge and understanding of Target Heart Rate Range (THRR)  Yes       Intervention  Provide education and explanation of THRR including how the numbers were predicted and where they are located for reference  Expected Outcomes  Short Term: Able to state/look up THRR;Short Term: Able to use daily as guideline for intensity in rehab;Long Term: Able to use THRR to govern intensity when exercising independently       Able to check pulse independently  Yes       Intervention  Provide education and demonstration on how to check pulse in  carotid and radial arteries.;Review the importance of being able to check your own pulse for safety during independent exercise       Expected Outcomes  Short Term: Able to explain why pulse checking is important during independent exercise;Long Term: Able to check pulse independently and accurately       Understanding of Exercise Prescription  Yes       Intervention  Provide education, explanation, and written materials on patient's individual exercise prescription       Expected Outcomes  Short Term: Able to explain program exercise prescription;Long Term: Able to explain home exercise prescription to exercise independently          Exercise Goals Re-Evaluation :   Discharge Exercise Prescription (Final Exercise Prescription Changes): Exercise Prescription Changes - 07/21/19 1400      Response to Exercise   Blood Pressure (Admit)  128/74    Blood Pressure (Exercise)  124/66    Blood Pressure (Exit)  128/64    Heart Rate (Admit)  57 bpm    Heart Rate (Exercise)  83 bpm    Heart Rate (Exit)  67 bpm    Oxygen Saturation (Admit)  97 %    Oxygen Saturation (Exercise)  89 %    Oxygen Saturation (Exit)  94 %    Rating of Perceived Exertion (Exercise)  13    Perceived Dyspnea (Exercise)  1    Symptoms  none       Nutrition:  Target Goals: Understanding of nutrition guidelines, daily intake of sodium <1536m, cholesterol <2016m calories 30% from fat and 7% or less from saturated fats, daily to have 5 or more servings of fruits and vegetables.  Biometrics: Pre Biometrics - 07/21/19 1452      Pre Biometrics   Height  5' 5.5" (1.664 m)    Weight  182 lb 9.6 oz (82.8 kg)    BMI (Calculated)  29.91    Single Leg Stand  8.99 seconds        Nutrition Therapy Plan and Nutrition Goals:   Nutrition Assessments:   Nutrition Goals Re-Evaluation:   Nutrition Goals Discharge (Final Nutrition Goals Re-Evaluation):   Psychosocial: Target Goals: Acknowledge presence or absence of  significant depression and/or stress, maximize coping skills, provide positive support system. Participant is able to verbalize types and ability to use techniques and skills needed for reducing stress and depression.   Initial Review & Psychosocial Screening: Initial Psych Review & Screening - 07/21/19 1350      Initial Review   Current issues with  Current Depression;History of Depression;Current Anxiety/Panic;Current Sleep Concerns;Current Stress Concerns    Source of Stress Concerns  Chronic Illness;Family;Transportation    Comments  has insomnia, going through separation from spouse, needs gas card      [place] No    Strains  Intra-family strains    Comments  has one daughter who works 2 jobs      Barriers   Psychosocial barriers to participate in program  There are no identifiable barriers or psychosocial needs.      Screening Interventions   Interventions  Encouraged to exercise    Expected Outcomes  Short Term goal: Utilizing psychosocial counselor, staff and physician to assist with identification of specific Stressors or current issues interfering with healing process. Setting desired goal for each stressor or current issue identified.;Long Term Goal: Stressors or current issues are controlled or eliminated.;Short Term goal: Identification and review with participant of any Quality of Life or Depression concerns found by scoring the questionnaire.;Long Term goal: The participant improves quality of Life and PHQ9 Scores as seen by post scores and/or verbalization of changes       Quality of Life Scores: Quality of Life - 07/21/19 1447      Quality of Life   Select  Quality of Life      Quality of Life Scores   Health/Function Pre  17.43 %    Socioeconomic Pre  16.25 %    Psych/Spiritual Pre  19.71 %    Family Pre  22.17 %    GLOBAL Pre  18.2 %      Scores of 19 and below usually indicate a poorer quality of life in these areas.  A  difference of  2-3 points is a clinically meaningful difference.  A difference of 2-3 points in the total score of the Quality of Life Index has been associated with significant improvement in overall quality of life, self-image, physical symptoms, and general health in studies assessing change in quality of life.  PHQ-9: Recent Review Flowsheet Data    Depression screen [facility] 2/9 07/21/2019   Decreased Interest 1   Down, Depressed, Hopeless 1   PHQ - 2 Score 2   Altered sleeping 1   Tired, decreased energy 1   Change in appetite 2   Feeling bad or failure about yourself  2   Trouble concentrating 1   Moving slowly or fidgety/restless 0   Suicidal thoughts 0   PHQ-9 Score 9   Difficult doing work/chores Somewhat difficult     Interpretation of Total Score  Total Score Depression Severity:  1-4 = Minimal depression, 5-9 = Mild depression, 10-14 = Moderate depression, 15-19 = Moderately severe depression, 20-27 = Severe depression   Psychosocial Evaluation and Intervention:   Psychosocial Re-Evaluation:   Psychosocial Discharge (Final Psychosocial Re-Evaluation):   Education: Education Goals: Education classes will be provided on a weekly basis, covering required topics. Participant will state understanding/return demonstration of topics presented.  Learning Barriers/Preferences:   Education Topics:  Initial Evaluation Education: - Verbal, written and demonstration of respiratory meds, oximetry and breathing techniques. Instruction on use of nebulizers and MDIs and importance of monitoring MDI activations.   General Nutrition Guidelines/Fats and Fiber: -Group instruction provided by verbal, written material, models and posters to present the general guidelines for heart healthy nutrition. Gives an explanation and review of dietary fats and fiber.   Controlling Sodium/Reading Food Labels: -Group verbal and written material supporting the discussion of sodium use in heart  healthy nutrition. Review and explanation with models, verbal and written materials for utilization of the food label.   Exercise Physiology & General Exercise Guidelines: - Group verbal and written instruction with models to review the exercise physiology of the cardiovascular system and associated critical values. Provides general exercise guidelines with specific guidelines to those with heart or lung disease.    Aerobic Exercise & Resistance Training: - Gives group verbal and written instruction on the various components of exercise. Focuses on aerobic and resistive training programs and the benefits of this training and how to safely  progress through these programs.   Flexibility, Balance, Mind/Body Relaxation: Provides group verbal/written instruction on the benefits of flexibility and balance training, including mind/body exercise modes such as yoga, pilates and tai chi.  Demonstration and skill practice provided.   Stress and Anxiety: - Provides group verbal and written instruction about the health risks of elevated stress and causes of high stress.  Discuss the correlation between heart/lung disease and anxiety and treatment options. Review healthy ways to manage with stress and anxiety.   Depression: - Provides group verbal and written instruction on the correlation between heart/lung disease and depressed mood, treatment options, and the stigmas associated with seeking treatment.   Exercise & Equipment Safety: - Individual verbal instruction and demonstration of equipment use and safety with use of the equipment.   Pulmonary Rehab from 07/21/2019 in [facility] Cardiac and Pulmonary Rehab  Date  07/21/19  Educator  AS  Instruction Review Code  1- Verbalizes Understanding      Infection Prevention: - Provides verbal and written material to individual with discussion of infection control including proper hand washing and proper equipment cleaning during exercise session.   Pulmonary  Rehab from 07/21/2019 in [facility] Cardiac and Pulmonary Rehab  Date  07/21/19  Educator  AS  Instruction Review Code  1- Verbalizes Understanding      Falls Prevention: - Provides verbal and written material to individual with discussion of falls prevention and safety.   Pulmonary Rehab from 07/21/2019 in [facility] Cardiac and Pulmonary Rehab  Date  07/21/19  Educator  AS  Instruction Review Code  1- Verbalizes Understanding      Diabetes: - Individual verbal and written instruction to review signs/symptoms of diabetes, desired ranges of glucose level fasting, after meals and with exercise. Advice that pre and post exercise glucose checks will be done for 3 sessions at entry of program.   Chronic Lung Diseases: - Group verbal and written instruction to review updates, respiratory medications, advancements in procedures and treatments. Discuss use of supplemental oxygen including available portable oxygen systems, continuous and intermittent flow rates, concentrators, personal use and safety guidelines. Review proper use of inhaler and spacers. Provide informative websites for self-education.    Energy Conservation: - Provide group verbal and written instruction for methods to conserve energy, plan and organize activities. Instruct on pacing techniques, use of adaptive equipment and posture/positioning to relieve shortness of breath.   Triggers and Exacerbations: - Group verbal and written instruction to review types of environmental triggers and ways to prevent exacerbations. Discuss weather changes, air quality and the benefits of nasal washing. Review warning signs and symptoms to help prevent infections. Discuss techniques for effective airway clearance, coughing, and vibrations.   AED/CPR: - Group verbal and written instruction with the use of models to demonstrate the basic use of the AED with the basic ABC's of resuscitation.   Anatomy and Physiology of the Lungs: - Group verbal and  written instruction with the use of models to provide basic lung anatomy and physiology related to function, structure and complications of lung disease.   Anatomy & Physiology of the Heart: - Group verbal and written instruction and models provide basic cardiac anatomy and physiology, with the coronary electrical and arterial systems. Review of Valvular disease and Heart Failure   Cardiac Medications: - Group verbal and written instruction to review commonly prescribed medications for heart disease. Reviews the medication, class of the drug, and side effects.   Know Your Numbers and Risk Factors: -Group verbal and written instruction  about important numbers in your health.  Discussion of what are risk factors and how they play a role in the disease process.  Review of Cholesterol, Blood Pressure, Diabetes, and BMI and the role they play in your overall health.   Sleep Hygiene: -Provides group verbal and written instruction about how sleep can affect your health.  Define sleep hygiene, discuss sleep cycles and impact of sleep habits. Review good sleep hygiene tips.    Other: -Provides group and verbal instruction on various topics (see comments)    Knowledge Questionnaire Score: Knowledge Questionnaire Score - 07/21/19 1441      Knowledge Questionnaire Score   Pre Score  21/26 (heart)        Core Components/Risk Factors/Patient Goals at Admission: Personal Goals and Risk Factors at Admission - 07/21/19 1449      Core Components/Risk Factors/Patient Goals on Admission    Weight Management  Weight Loss;Yes    Intervention  Weight Management: Develop a combined nutrition and exercise program designed to reach desired caloric intake, while maintaining appropriate intake of nutrient and fiber, sodium and fats, and appropriate energy expenditure required for the weight goal.;Weight Management: Provide education and appropriate resources to help participant work on and attain dietary  goals.;Weight Management/Obesity: Establish reasonable short term and long term weight goals.    Admit Weight  182 lb 9.6 oz (82.8 kg)    Goal Weight: Short Term  172 lb (78 kg)    Goal Weight: Long Term  162 lb (73.5 kg)    Expected Outcomes  Short Term: Continue to assess and modify interventions until short term weight is achieved;Long Term: Adherence to nutrition and physical activity/exercise program aimed toward attainment of established weight goal;Weight Loss: Understanding of general recommendations for a balanced deficit meal plan, which promotes 1-2 lb weight loss per week and includes a negative energy balance of [phone] kcal/d;Understanding recommendations for meals to include 15-35% energy as protein, 25-35% energy from fat, 35-60% energy from carbohydrates, less than 280m of dietary cholesterol, 20-35 gm of total fiber daily;Understanding of distribution of calorie intake throughout the day with the consumption of 4-5 meals/snacks    Improve shortness of breath with ADL's  Yes    Intervention  Provide education, individualized exercise plan and daily activity instruction to help decrease symptoms of SOB with activities of daily living.    Expected Outcomes  Short Term: Improve cardiorespiratory fitness to achieve a reduction of symptoms when performing ADLs;Long Term: Be able to perform more ADLs without symptoms or delay the onset of symptoms    Heart Failure  Yes    Intervention  Provide a combined exercise and nutrition program that is supplemented with education, support and counseling about heart failure. Directed toward relieving symptoms such as shortness of breath, decreased exercise tolerance, and extremity edema.    Expected Outcomes  Improve functional capacity of life;Short term: Attendance in program 2-3 days a week with increased exercise capacity. Reported lower sodium intake. Reported increased fruit and vegetable intake. Reports medication compliance.;Short term: Daily  weights obtained and reported for increase. Utilizing diuretic protocols set by physician.;Long term: Adoption of self-care skills and reduction of barriers for early signs and symptoms recognition and intervention leading to self-care maintenance.    Hypertension  Yes    Intervention  Provide education on lifestyle modifcations including regular physical activity/exercise, weight management, moderate sodium restriction and increased consumption of fresh fruit, vegetables, and low fat dairy, alcohol moderation, and smoking cessation.;Monitor prescription use compliance.  Expected Outcomes  Short Term: Continued assessment and intervention until BP is < 140/88m HG in hypertensive participants. < 130/849mHG in hypertensive participants with diabetes, heart failure or chronic kidney disease.;Long Term: Maintenance of blood pressure at goal levels.    Stress  Yes    Intervention  Offer individual and/or small group education and counseling on adjustment to heart disease, stress management and health-related lifestyle change. Teach and support self-help strategies.;Refer participants experiencing significant psychosocial distress to appropriate mental health specialists for further evaluation and treatment. When possible, include family members and significant others in education/counseling sessions.       Core Components/Risk Factors/Patient Goals Review:    Core Components/Risk Factors/Patient Goals at Discharge (Final Review):    ITP Comments:   Comments: initial ITP

## 2019-07-21 NOTE — Patient Instructions (Signed)
Patient Instructions  Patient Details  Name: Anna Romero MRN: [id number] Date of Birth: 07-22-1950 Referring Provider:  Corey Skains, MD  Below are your personal goals for exercise, nutrition, and risk factors. Our goal is to help you stay on track towards obtaining and maintaining these goals. We will be discussing your progress on these goals with you throughout the program.  Initial Exercise Prescription: Initial Exercise Prescription - 07/21/19 1400      Date of Initial Exercise RX and Referring Provider   Date  07/21/19    Referring Provider  Nehemiah Massed      Treadmill   MPH  2    Grade  0    Minutes  15    METs  2.5      Recumbant Bike   Level  1    RPM  60    Minutes  15    METs  2.5      NuStep   Level  2    SPM  80    Minutes  15    METs  2.5      REL-XR   Level  2    Speed  50    Minutes  15    METs  2.5      Prescription Details   Frequency (times per week)  3    Duration  Progress to 30 minutes of continuous aerobic without signs/symptoms of physical distress      Intensity   THRR 40-80% of Max Heartrate  95-132    Ratings of Perceived Exertion  11-15    Perceived Dyspnea  0-4      Resistance Training   Training Prescription  Yes    Weight  3 lb    Reps  10-15       Exercise Goals: Frequency: Be able to perform aerobic exercise two to three times per week in program working toward 2-5 days per week of home exercise.  Intensity: Work with a perceived exertion of 11 (fairly light) - 15 (hard) while following your exercise prescription.  We will make changes to your prescription with you as you progress through the program.   Duration: Be able to do 30 to 45 minutes of continuous aerobic exercise in addition to a 5 minute warm-up and a 5 minute cool-down routine.   Nutrition Goals: Your personal nutrition goals will be established when you do your nutrition analysis with the dietician.  The following are general nutrition guidelines to  follow: Cholesterol < 200mg /day Sodium < 1500mg /day Fiber: Women over 50 yrs - 21 grams per day  Personal Goals: Personal Goals and Risk Factors at Admission - 07/21/19 1449      Core Components/Risk Factors/Patient Goals on Admission    Weight Management  Weight Loss;Yes    Intervention  Weight Management: Develop a combined nutrition and exercise program designed to reach desired caloric intake, while maintaining appropriate intake of nutrient and fiber, sodium and fats, and appropriate energy expenditure required for the weight goal.;Weight Management: Provide education and appropriate resources to help participant work on and attain dietary goals.;Weight Management/Obesity: Establish reasonable short term and long term weight goals.    Admit Weight  182 lb 9.6 oz (82.8 kg)    Goal Weight: Short Term  172 lb (78 kg)    Goal Weight: Long Term  162 lb (73.5 kg)    Expected Outcomes  Short Term: Continue to assess and modify interventions until short term weight is achieved;Long Term:  Adherence to nutrition and physical activity/exercise program aimed toward attainment of established weight goal;Weight Loss: Understanding of general recommendations for a balanced deficit meal plan, which promotes 1-2 lb weight loss per week and includes a negative energy balance of [id number] kcal/d;Understanding recommendations for meals to include 15-35% energy as protein, 25-35% energy from fat, 35-60% energy from carbohydrates, less than 200mg  of dietary cholesterol, 20-35 gm of total fiber daily;Understanding of distribution of calorie intake throughout the day with the consumption of 4-5 meals/snacks    Improve shortness of breath with ADL's  Yes    Intervention  Provide education, individualized exercise plan and daily activity instruction to help decrease symptoms of SOB with activities of daily living.    Expected Outcomes  Short Term: Improve cardiorespiratory fitness to achieve a reduction of symptoms when  performing ADLs;Long Term: Be able to perform more ADLs without symptoms or delay the onset of symptoms    Heart Failure  Yes    Intervention  Provide a combined exercise and nutrition program that is supplemented with education, support and counseling about heart failure. Directed toward relieving symptoms such as shortness of breath, decreased exercise tolerance, and extremity edema.    Expected Outcomes  Improve functional capacity of life;Short term: Attendance in program 2-3 days a week with increased exercise capacity. Reported lower sodium intake. Reported increased fruit and vegetable intake. Reports medication compliance.;Short term: Daily weights obtained and reported for increase. Utilizing diuretic protocols set by physician.;Long term: Adoption of self-care skills and reduction of barriers for early signs and symptoms recognition and intervention leading to self-care maintenance.    Hypertension  Yes    Intervention  Provide education on lifestyle modifcations including regular physical activity/exercise, weight management, moderate sodium restriction and increased consumption of fresh fruit, vegetables, and low fat dairy, alcohol moderation, and smoking cessation.;Monitor prescription use compliance.    Expected Outcomes  Short Term: Continued assessment and intervention until BP is < 140/30mm HG in hypertensive participants. < 130/67mm HG in hypertensive participants with diabetes, heart failure or chronic kidney disease.;Long Term: Maintenance of blood pressure at goal levels.    Stress  Yes    Intervention  Offer individual and/or small group education and counseling on adjustment to heart disease, stress management and health-related lifestyle change. Teach and support self-help strategies.;Refer participants experiencing significant psychosocial distress to appropriate mental health specialists for further evaluation and treatment. When possible, include family members and significant  others in education/counseling sessions.       Tobacco Use Initial Evaluation: Social History   Tobacco Use  Smoking Status Never Smoker  Smokeless Tobacco Never Used    Exercise Goals and Review: Exercise Goals    Row Name 07/21/19 1451             Exercise Goals   Increase Physical Activity  Yes       Intervention  Provide advice, education, support and counseling about physical activity/exercise needs.;Develop an individualized exercise prescription for aerobic and resistive training based on initial evaluation findings, risk stratification, comorbidities and participant's personal goals.       Expected Outcomes  Short Term: Attend rehab on a regular basis to increase amount of physical activity.;Long Term: Add in home exercise to make exercise part of routine and to increase amount of physical activity.;Long Term: Exercising regularly at least 3-5 days a week.       Increase Strength and Stamina  Yes       Intervention  Provide advice, education, support and counseling  about physical activity/exercise needs.;Develop an individualized exercise prescription for aerobic and resistive training based on initial evaluation findings, risk stratification, comorbidities and participant's personal goals.       Expected Outcomes  Short Term: Increase workloads from initial exercise prescription for resistance, speed, and METs.;Short Term: Perform resistance training exercises routinely during rehab and add in resistance training at home;Long Term: Improve cardiorespiratory fitness, muscular endurance and strength as measured by increased METs and functional capacity (6MWT)       Able to understand and use rate of perceived exertion (RPE) scale  Yes       Intervention  Provide education and explanation on how to use RPE scale       Expected Outcomes  Short Term: Able to use RPE daily in rehab to express subjective intensity level;Long Term:  Able to use RPE to guide intensity level when  exercising independently       Able to understand and use Dyspnea scale  Yes       Intervention  Provide education and explanation on how to use Dyspnea scale       Expected Outcomes  Short Term: Able to use Dyspnea scale daily in rehab to express subjective sense of shortness of breath during exertion;Long Term: Able to use Dyspnea scale to guide intensity level when exercising independently       Knowledge and understanding of Target Heart Rate Range (THRR)  Yes       Intervention  Provide education and explanation of THRR including how the numbers were predicted and where they are located for reference       Expected Outcomes  Short Term: Able to state/look up THRR;Short Term: Able to use daily as guideline for intensity in rehab;Long Term: Able to use THRR to govern intensity when exercising independently       Able to check pulse independently  Yes       Intervention  Provide education and demonstration on how to check pulse in carotid and radial arteries.;Review the importance of being able to check your own pulse for safety during independent exercise       Expected Outcomes  Short Term: Able to explain why pulse checking is important during independent exercise;Long Term: Able to check pulse independently and accurately       Understanding of Exercise Prescription  Yes       Intervention  Provide education, explanation, and written materials on patient's individual exercise prescription       Expected Outcomes  Short Term: Able to explain program exercise prescription;Long Term: Able to explain home exercise prescription to exercise independently          Copy of goals given to participant.

## 2019-07-29 ENCOUNTER — Encounter: Payer: Self-pay | Admitting: *Deleted

## 2019-07-29 DIAGNOSIS — I5022 Chronic systolic (congestive) heart failure: Secondary | ICD-10-CM

## 2019-08-02 NOTE — Progress Notes (Signed)
Virtual Visit via Telephone Note  I connected with Anna Romero on 08/03/19 at  9:00 AM EDT by telephone and verified that I am speaking with the correct person using two identifiers.  Location: Patient: Home Provider: [facility] - [id number], [place], [place], [place] [address]   I discussed the limitations, risks, security and privacy concerns of performing an evaluation and management service by telephone and the availability of in person appointments. I also discussed with the patient that there may be a patient responsible charge related to this service. The patient expressed understanding and agreed to proceed.  Patient consented to consult via telephone: Yes People present and their role in pt care: Pt   History of Present Illness:  69 year old female never smoker followed in our office for asthma  Past medical history: Depression, chronic kidney disease, hypertension, CHF, polymyositis Smoking history: Never smoker Maintenance: None  Patient of Dr. Patsey Berthold  Chief complaint: Cough   69 year old female never smoker followed in our office for asthma.  Patient also found to have restrictive lung disease.  As well as pulmonary hypertension likely WHO group 2/3.  Patient carries diagnosis of polymyositis but she reports she has not been followed by rheumatology in some time.  She was told to follow-up as needed.  Her last rheumatologist was [facility].  Last appointment with our office was in July/2020 with Dr. Jamal Collin.  That assessment and plan as listed below:  12/16/18- [facility] - last Assessment and Plan  IMPRESSION:     ICD-10-CM   1. 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 Mild persistent asthma without complication  A999333   3. 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   2) continue albuterol inhaler as needed for increased shortness of breath, chest tightness, wheezing, cough 3) if she requires albuterol more than 3-4 times per week, we should consider resuming Symbicort inhaler 4) I encouraged that she get her flu shot this year when available 5) follow-up in this office as needed for any new or increasing respiratory or chest symptoms  Patient reporting today that she continues to have an ongoing cyclical cough.  She reports that she has been having this for the last 10 to 15 years.  She also reports that "tissue" occasionally comes up.  Patient denies hemoptysis.  She reports that she has been having the symptoms for the last 10 to 15 years.  Last CT imaging was in 2017.  Patient has not obtained any recent chest imaging.  She is a former patient of Dr. Jamal Collin and will establish with Dr. Patsey Berthold in April/2021.  She denies chest pain, palpitations, nasal drainage, fevers.  She has had no changes in use of her rescue inhaler.  Using her rescue inhaler about 2 times over the last month.  Typically is just symptomatic with strong smells.  Observations/Objective:  12/02/2015-CT chest without contrast-pleural thickening with trace pleural few fluid at left lung base similar to 2015 in 2010, associated left lower lobe scarring/rounded atelectasis cyst, left lower lobe scarring with associated pleural thickening and trace pleural effusion, chronic   01/14/2018-pulmonary function test-FVC 2.07 (66% predicted), postbronchodilator ratio of 70, postbronchodilator FEV1 1.49 (60% predicted), no bronchodilator response, DLCO 9.3 (41% predicted)  Social  History   Tobacco Use  Smoking Status Never Smoker  Smokeless Tobacco Never Used   Immunization History  Administered Date(s) Administered  . Influenza, High Dose Seasonal PF 01/16/2018  . PFIZER SARS-COV-2 Vaccination 07/16/2019  . Rabies, IM 10/29/2015, 11/01/2015, 11/05/2015, 11/12/2015   . Tdap 10/28/2015    Assessment and Plan:  Pulmonary hypertension (H[facility]) Likely WHO group 2/3  Plan: Continue follow-up with cardiology Establish care with Dr. Patsey Berthold in April/2020 Chest x-ray today  Mild intermittent asthma Currently stable Only using rescue inhaler 2 times over the last month Not on any maintenance inhalers  Plan: Continue risk inhaler as needed Follow-up with Dr. Patsey Berthold and establish care in April  Cough Patient reporting 10 to 0000000 cyclical cough that is occasionally productive with "tissue" No recent CT or chest imaging Last CT imaging was in 2017 Patient denies hemoptysis  Plan: Chest x-ray today  Polymyositis [facility]) Carries history of polymyositis Has not seen rheumatology in the last few years, told to follow-up as needed  Plan: Continue to clinically monitor, based off of imaging may refer back to rheumatology   Follow Up Instructions:  Return in about 6 weeks (around 09/14/2019), or if symptoms worsen or fail to improve, for [facility] - Dr. Patsey Berthold.   I discussed the assessment and treatment plan with the patient. The patient was provided an opportunity to ask questions and all were answered. The patient agreed with the plan and demonstrated an understanding of the instructions.   The patient was advised to call back or seek an in-person evaluation if the symptoms worsen or if the condition fails to improve as anticipated.  I provided 26 minutes of non-face-to-face time during this encounter.   Lauraine Rinne, NP

## 2019-08-03 ENCOUNTER — Encounter: Payer: Self-pay | Admitting: Pulmonary Disease

## 2019-08-03 ENCOUNTER — Other Ambulatory Visit: Payer: Self-pay

## 2019-08-03 ENCOUNTER — Encounter: Payer: Medicare Other | Admitting: *Deleted

## 2019-08-03 ENCOUNTER — Ambulatory Visit (INDEPENDENT_AMBULATORY_CARE_PROVIDER_SITE_OTHER): Payer: Medicare Other | Admitting: Pulmonary Disease

## 2019-08-03 DIAGNOSIS — J984 Other disorders of lung: Secondary | ICD-10-CM

## 2019-08-03 DIAGNOSIS — I272 Pulmonary hypertension, unspecified: Secondary | ICD-10-CM

## 2019-08-03 DIAGNOSIS — I5022 Chronic systolic (congestive) heart failure: Secondary | ICD-10-CM

## 2019-08-03 DIAGNOSIS — M3320 Polymyositis, organ involvement unspecified: Secondary | ICD-10-CM

## 2019-08-03 DIAGNOSIS — J439 Emphysema, unspecified: Secondary | ICD-10-CM | POA: Diagnosis not present

## 2019-08-03 DIAGNOSIS — J4520 Mild intermittent asthma, uncomplicated: Secondary | ICD-10-CM | POA: Diagnosis not present

## 2019-08-03 DIAGNOSIS — I130 Hypertensive heart and chronic kidney disease with heart failure and stage 1 through stage 4 chronic kidney disease, or unspecified chronic kidney disease: Secondary | ICD-10-CM | POA: Diagnosis not present

## 2019-08-03 DIAGNOSIS — R059 Cough, unspecified: Secondary | ICD-10-CM | POA: Insufficient documentation

## 2019-08-03 DIAGNOSIS — R05 Cough: Secondary | ICD-10-CM

## 2019-08-03 NOTE — Assessment & Plan Note (Signed)
Carries history of polymyositis Has not seen rheumatology in the last few years, told to follow-up as needed  Plan: Continue to clinically monitor, based off of imaging may refer back to rheumatology

## 2019-08-03 NOTE — Assessment & Plan Note (Signed)
Patient reporting 10 to 0000000 cyclical cough that is occasionally productive with "tissue" No recent CT or chest imaging Last CT imaging was in 2017 Patient denies hemoptysis  Plan: Chest x-ray today

## 2019-08-03 NOTE — Patient Instructions (Addendum)
You were seen today by Lauraine Rinne, NP  for:   It was nice talking with you today given your symptoms of cough that occasionally has "tissue" coming up we will get a chest x-ray.  If you start having chest pain or heart racing please present to an emergency room or urgent care for in person evaluation.  We will follow up with you after your chest x-ray.  Nice meeting you,  Anna Romero   1. Mild intermittent asthma without complication  Only use your albuterol as a rescue medication to be used if you can't catch your breath by resting or doing a relaxed purse lip breathing pattern.  - The less you use it, the better it will work when you need it. - Ok to use up to 2 puffs  every 4 hours if you must but call for immediate appointment if use goes up over your usual need - Don't leave home without it !!  (think of it like the spare tire for your car)   2. Pulmonary hypertension ([place]) 3. Mixed restrictive and obstructive lung disease ([place] 4. Polymyositis ([place]) 5. Cough  - DG Chest 2 View; Future   We recommend today:  Orders Placed This Encounter  Procedures  . DG Chest 2 View    Standing Status:   Future    Standing Expiration Date:   10/02/2020    Order Specific Question:   Reason for Exam (SYMPTOM  OR DIAGNOSIS REQUIRED)    Answer:   cough    Order Specific Question:   Preferred imaging location?    Answer:   [facility]    Order Specific Question:   Radiology Contrast Protocol - do NOT remove file path    Answer:   [email or web address]    Order Specific Question:   Release to patient    Answer:   Immediate   Orders Placed This Encounter  Procedures  . DG Chest 2 View   No orders of the defined types were placed in this encounter.   Follow Up:    Return in about 6 weeks (around 09/14/2019), or if symptoms worsen or fail to improve, for [facility] - Dr. Patsey Berthold.   Please do your part to reduce the spread of  COVID-19:      Reduce your risk of any infection  and COVID19 by using the similar precautions used for avoiding the common cold or flu:  Marland Kitchen Wash your hands often with soap and warm water for at least 20 seconds.  If soap and water are not readily available, use an alcohol-based hand sanitizer with at least 60% alcohol.  . If coughing or sneezing, cover your mouth and nose by coughing or sneezing into the elbow areas of your shirt or coat, into a tissue or into your sleeve (not your hands). Langley Gauss A MASK when in public  . Avoid shaking hands with others and consider head nods or verbal greetings only. . Avoid touching your eyes, nose, or mouth with unwashed hands.  . Avoid close contact with people who are sick. . Avoid places or events with large numbers of people in one location, like concerts or sporting events. . If you have some symptoms but not all symptoms, continue to monitor at home and seek medical attention if your symptoms worsen. . If you are having a medical emergency, call 911.   ADDITIONAL HEALTHCARE OPTIONS FOR PATIENTS  [facility] / e-Visit: [email or web address]  [facility]: [facility]: [phone]                   [facility]: [phone]     It is flu season:   >>> Best ways to protect herself from the flu: Receive the yearly flu vaccine, practice good hand hygiene washing with soap and also using hand sanitizer when available, eat a nutritious meals, get adequate rest, hydrate appropriately   Please contact the office if your symptoms worsen or you have concerns that you are not improving.   Thank you for choosing [facility] for your healthcare, and for allowing [place] to partner with you on your healthcare journey. I am thankful to be able to provide care to you today.   Wyn Quaker FNP-C

## 2019-08-03 NOTE — Assessment & Plan Note (Signed)
Likely WHO group 2/3  Plan: Continue follow-up with cardiology Establish care with Dr. Patsey Berthold in April/2020 Chest x-ray today

## 2019-08-03 NOTE — Assessment & Plan Note (Signed)
Currently stable Only using rescue inhaler 2 times over the last month Not on any maintenance inhalers  Plan: Continue risk inhaler as needed Follow-up with Dr. Patsey Berthold and establish care in April

## 2019-08-03 NOTE — Progress Notes (Signed)
Daily Session Note  Patient Details  Name: Anna Romero MRN: [id number] Date of Birth: 08/31/1950 Referring Provider:     Pulmonary Rehab from 07/21/2019 in [facility]  Referring Provider  [place]      Encounter Date: 08/03/2019  Check In: Session Check In - 08/03/19 1509      Check-In   Supervising physician immediately available to respond to emergencies  See telemetry face sheet for immediately available ER MD    Location  [facility]    Staff Present  Meredith Craven, RN BSN;Joseph Hood RCP,RRT,BSRT;Kelly Hayes, BS, ACSM CEP, Exercise Physiologist    Virtual Visit  No    Medication changes reported      No    Fall or balance concerns reported     No    Warm-up and Cool-down  Performed on first and last piece of equipment    Resistance Training Performed  Yes    VAD Patient?  No    PAD/SET Patient?  No      Pain Assessment   Currently in Pain?  No/denies          Social History   Tobacco Use  Smoking Status Never Smoker  Smokeless Tobacco Never Used    Goals Met:  Independence with exercise equipment Exercise tolerated well No report of cardiac concerns or symptoms Strength training completed today  Goals Unmet:  Not Applicable  Comments: First full day of exercise!  Patient was oriented to gym and equipment including functions, settings, policies, and procedures.  Patient's individual exercise prescription and treatment plan were reviewed.  All starting workloads were established based on the results of the 6 minute walk test done at initial orientation visit.  The plan for exercise progression was also introduced and progression will be customized based on patient's performance and goals.    Dr. Mark Miller is Medical Director for [facility] and [name]. 

## 2019-08-05 ENCOUNTER — Other Ambulatory Visit: Payer: Self-pay

## 2019-08-05 ENCOUNTER — Encounter: Payer: Medicare Other | Admitting: *Deleted

## 2019-08-05 DIAGNOSIS — I130 Hypertensive heart and chronic kidney disease with heart failure and stage 1 through stage 4 chronic kidney disease, or unspecified chronic kidney disease: Secondary | ICD-10-CM | POA: Diagnosis not present

## 2019-08-05 DIAGNOSIS — I5022 Chronic systolic (congestive) heart failure: Secondary | ICD-10-CM

## 2019-08-05 NOTE — Progress Notes (Signed)
Daily Session Note  Patient Details  Name: CHANDREA ZELLMAN MRN: [id number] Date of Birth: 1951-01-16 Referring Provider:     Pulmonary Rehab from 07/21/2019 in [facility]  Referring Provider  Nehemiah Massed      Encounter Date: 08/05/2019  Check In: Session Check In - 08/05/19 [place]      Check-In   Supervising physician immediately available to respond to emergencies  See telemetry face sheet for immediately available ER MD    Location  [facility]    Staff Present  Heath Lark, RN, BSN, CCRP;Meredith Sherryll Burger, RN BSN;Melissa Caiola RDN, LDN    Virtual Visit  No    Medication changes reported      No    Fall or balance concerns reported     No    Warm-up and Cool-down  Performed on first and last piece of equipment    Resistance Training Performed  Yes    VAD Patient?  No    PAD/SET Patient?  No      Pain Assessment   Currently in Pain?  No/denies          Social History   Tobacco Use  Smoking Status Never Smoker  Smokeless Tobacco Never Used    Goals Met:  Exercise tolerated well No report of cardiac concerns or symptoms  Goals Unmet:  Not Applicable  Comments: Pt able to follow exercise prescription today without complaint.  Will continue to monitor for progression.    Dr. Emily Filbert is Medical Director for [place] and [name].

## 2019-08-12 ENCOUNTER — Encounter: Payer: Medicare Other | Admitting: *Deleted

## 2019-08-12 ENCOUNTER — Other Ambulatory Visit: Payer: Self-pay

## 2019-08-12 ENCOUNTER — Ambulatory Visit: Payer: Medicare Other | Admitting: Pulmonary Disease

## 2019-08-12 ENCOUNTER — Ambulatory Visit: Payer: Medicare Other | Attending: Internal Medicine

## 2019-08-12 ENCOUNTER — Encounter: Payer: Self-pay | Admitting: *Deleted

## 2019-08-12 DIAGNOSIS — I5022 Chronic systolic (congestive) heart failure: Secondary | ICD-10-CM

## 2019-08-12 DIAGNOSIS — Z23 Encounter for immunization: Secondary | ICD-10-CM

## 2019-08-12 DIAGNOSIS — I130 Hypertensive heart and chronic kidney disease with heart failure and stage 1 through stage 4 chronic kidney disease, or unspecified chronic kidney disease: Secondary | ICD-10-CM | POA: Diagnosis not present

## 2019-08-12 NOTE — Progress Notes (Signed)
   Covid-19 Vaccination Clinic  Name:  Anna Romero    MRN: [id number] DOB: [birth year]  08/12/2019  Anna Romero was observed post Covid-19 immunization for 15 minutes without incident. She was provided with Vaccine Information Sheet and instruction to access the V-Safe system.   Anna Romero was instructed to call 911 with any severe reactions post vaccine: Marland Kitchen Difficulty breathing  . Swelling of face and throat  . A fast heartbeat  . A bad rash all over body  . Dizziness and weakness   Immunizations Administered    Name Date Dose VIS Date Route   Pfizer COVID-19 Vaccine 08/12/2019 11:07 AM 0.3 mL 05/01/2019 Intramuscular   Manufacturer: [employer], [employer]   Lot: [id number]   [place]: [id number]

## 2019-08-12 NOTE — Progress Notes (Signed)
Pulmonary Individual Treatment Plan  Patient Details  Name: Anna Romero MRN: [id number] Date of Birth: May 13, 1951 Referring Provider:     Pulmonary Rehab from 07/21/2019 in [facility] Cardiac and Pulmonary Rehab  Referring Provider  Nehemiah Massed      Initial Encounter Date:    Pulmonary Rehab from 07/21/2019 in [facility] Cardiac and Pulmonary Rehab  Date  07/21/19      Visit Diagnosis: Chronic systolic CHF (congestive heart failure), NYHA class 2 ([place])  Patient's Home Medications on Admission:  Current Outpatient Medications:  .  acetaminophen (TYLENOL) 500 MG tablet, Take by mouth., Disp: , Rfl:  .  albuterol (VENTOLIN HFA) 108 (90 Base) MCG/ACT inhaler, Inhale 1-2 puffs into the lungs every 6 (six) hours as needed for wheezing or shortness of breath., Disp: 18 g, Rfl: 5 .  buPROPion (WELLBUTRIN XL) 150 MG 24 hr tablet, Take 150 mg by mouth daily. , Disp: , Rfl: 3 .  carvedilol (COREG) 12.5 MG tablet, Take 12.5 mg by mouth 2 (two) times daily with a meal., Disp: , Rfl:  .  Cholecalciferol (VITAMIN D3) 1000 units CAPS, Take by mouth., Disp: , Rfl:  .  clonazePAM (KLONOPIN) 0.5 MG tablet, Take 0.5 mg by mouth 2 (two) times daily as needed for anxiety., Disp: , Rfl:  .  donepezil (ARICEPT) 10 MG tablet, Take 10 mg by mouth at bedtime., Disp: , Rfl:  .  furosemide (LASIX) 20 MG tablet, Take 20 mg by mouth., Disp: , Rfl:  .  Multiple Vitamins-Minerals (MULTIVITAMIN WITH MINERALS) tablet, Take 1 tablet by mouth daily., Disp: , Rfl:  .  omeprazole (PRILOSEC) 20 MG capsule, Take 20 mg by mouth daily., Disp: , Rfl:  .  spironolactone (ALDACTONE) 25 MG tablet, Take 25 mg by mouth daily., Disp: , Rfl:  .  telmisartan (MICARDIS) 40 MG tablet, Take 40 mg by mouth daily., Disp: , Rfl:  .  traZODone (DESYREL) 100 MG tablet, Take 1-2 tablets by mouth as needed., Disp: , Rfl:   Past Medical History: Past Medical History:  Diagnosis Date  . Anxiety   . CHF (congestive heart failure) ([place])   . Chronic kidney  disease    Renal Insufficiency Stage 3  . Degenerative disc disease, cervical   . Depression   . GERD (gastroesophageal reflux disease)   . Hyperlipidemia   . Hypertension   . Mixed restrictive and obstructive lung disease ([place])    Predominantly restriction due to chronic volume loss in the left lung.  Mild small airways disease.  . Moderate mitral insufficiency   . Monoclonal gammopathy   . Polymyositis ([place])   . Pulmonary hypertension (HCC)     Tobacco Use: Social History   Tobacco Use  Smoking Status Never Smoker  Smokeless Tobacco Never Used    Labs: Recent Review Flowsheet Data    There is no flowsheet data to display.       Pulmonary Assessment Scores:   UCSD: Self-administered rating of dyspnea associated with activities of daily living (ADLs) 6-point scale (0 = "not at all" to 5 = "maximal or unable to do because of breathlessness")  Scoring Scores range from 0 to 120.  Minimally important difference is 5 units  CAT: CAT can identify the health impairment of COPD patients and is better correlated with disease progression.  CAT has a scoring range of zero to 40. The CAT score is classified into four groups of low (less than 10), medium (10 - 20), high (21-30) and very high (31-40)  based on the impact level of disease on health status. A CAT score over 10 suggests significant symptoms.  A worsening CAT score could be explained by an exacerbation, poor medication adherence, poor inhaler technique, or progression of COPD or comorbid conditions.  CAT MCID is 2 points  mMRC: mMRC (Modified Medical Research Council) Dyspnea Scale is used to assess the degree of baseline functional disability in patients of respiratory disease due to dyspnea. No minimal important difference is established. A decrease in score of 1 point or greater is considered a positive change.   Pulmonary Function Assessment:   Exercise Target Goals: Exercise Program Goal: Individual exercise  prescription set using results from initial 6 min walk test and THRR while considering  patient's activity barriers and safety.   Exercise Prescription Goal: Initial exercise prescription builds to 30-45 minutes a day of aerobic activity, 2-3 days per week.  Home exercise guidelines will be given to patient during program as part of exercise prescription that the participant will acknowledge.  Activity Barriers & Risk Stratification: Activity Barriers & Cardiac Risk Stratification - 07/21/19 1351      Activity Barriers & Cardiac Risk Stratification   Activity Barriers  Right Hip Replacement       6 Minute Walk: 6 Minute Walk    Row Name 07/21/19 1421         6 Minute Walk   Phase  Initial     Distance  1250 feet     Walk Time  6 minutes     # of Rest Breaks  0     MPH  2.36     METS  2.52     RPE  13     Perceived Dyspnea   1     VO2 Peak  8.8     Symptoms  No     Resting HR  57 bpm     Resting BP  128/74     Resting Oxygen Saturation   97 %     Exercise Oxygen Saturation  during 6 min walk  89 %     Max Ex. HR  83 bpm     Max Ex. BP  124/66     2 Minute Post BP  128/64       Interval HR   1 Minute HR  64     2 Minute HR  67     3 Minute HR  68     4 Minute HR  79     5 Minute HR  83     6 Minute HR  78     2 Minute Post HR  67     Interval Heart Rate?  Yes       Interval Oxygen   Interval Oxygen?  Yes     Baseline Oxygen Saturation %  97 %     1 Minute Oxygen Saturation %  95 %     1 Minute Liters of Oxygen  0 L     2 Minute Oxygen Saturation %  93 %     2 Minute Liters of Oxygen  0 L     3 Minute Oxygen Saturation %  89 %     3 Minute Liters of Oxygen  0 L     4 Minute Oxygen Saturation %  89 %     4 Minute Liters of Oxygen  0 L     5 Minute Oxygen Saturation %  91 %  5 Minute Liters of Oxygen  0 L     6 Minute Oxygen Saturation %  91 %     6 Minute Liters of Oxygen  0 L     2 Minute Post Oxygen Saturation %  94 %     2 Minute Post Liters of Oxygen   0 L       Oxygen Initial Assessment: Oxygen Initial Assessment - 07/21/19 1453      Home Oxygen   Home Oxygen Device  None    Sleep Oxygen Prescription  CPAP    Home Exercise Oxygen Prescription  None    Home at Rest Exercise Oxygen Prescription  None    Compliance with Home Oxygen Use  Yes      Initial 6 min Walk   Oxygen Used  None      Program Oxygen Prescription   Program Oxygen Prescription  None      Intervention   Short Term Goals  To learn and exhibit compliance with exercise, home and travel O2 prescription;To learn and understand importance of monitoring SPO2 with pulse oximeter and demonstrate accurate use of the pulse oximeter.;To learn and understand importance of maintaining oxygen saturations>88%;To learn and demonstrate proper pursed lip breathing techniques or other breathing techniques.;To learn and demonstrate proper use of respiratory medications    Long  Term Goals  Exhibits compliance with exercise, home and travel O2 prescription;Verbalizes importance of monitoring SPO2 with pulse oximeter and return demonstration;Maintenance of O2 saturations>88%;Exhibits proper breathing techniques, such as pursed lip breathing or other method taught during program session;Compliance with respiratory medication;Demonstrates proper use of MDI's       Oxygen Re-Evaluation: Oxygen Re-Evaluation    Row Name 08/03/19 1512             Program Oxygen Prescription   Program Oxygen Prescription  None         Home Oxygen   Home Oxygen Device  None       Sleep Oxygen Prescription  CPAP       Home Exercise Oxygen Prescription  None       Home at Rest Exercise Oxygen Prescription  None       Compliance with Home Oxygen Use  Yes         Goals/Expected Outcomes   Short Term Goals  To learn and exhibit compliance with exercise, home and travel O2 prescription;To learn and understand importance of monitoring SPO2 with pulse oximeter and demonstrate accurate use of the pulse  oximeter.;To learn and understand importance of maintaining oxygen saturations>88%;To learn and demonstrate proper pursed lip breathing techniques or other breathing techniques.;To learn and demonstrate proper use of respiratory medications       Long  Term Goals  Exhibits compliance with exercise, home and travel O2 prescription;Verbalizes importance of monitoring SPO2 with pulse oximeter and return demonstration;Maintenance of O2 saturations>88%;Exhibits proper breathing techniques, such as pursed lip breathing or other method taught during program session;Compliance with respiratory medication;Demonstrates proper use of MDI's       Comments  Reviewed PLB technique with pt.  Talked about how it works and it's importance in maintaining their exercise saturations.       Goals/Expected Outcomes  Short: Become more profiecient at using PLB.   Long: Become independent at using PLB.          Oxygen Discharge (Final Oxygen Re-Evaluation): Oxygen Re-Evaluation - 08/03/19 1512      Program Oxygen Prescription   Program Oxygen Prescription  None  Home Oxygen   Home Oxygen Device  None    Sleep Oxygen Prescription  CPAP    Home Exercise Oxygen Prescription  None    Home at Rest Exercise Oxygen Prescription  None    Compliance with Home Oxygen Use  Yes      Goals/Expected Outcomes   Short Term Goals  To learn and exhibit compliance with exercise, home and travel O2 prescription;To learn and understand importance of monitoring SPO2 with pulse oximeter and demonstrate accurate use of the pulse oximeter.;To learn and understand importance of maintaining oxygen saturations>88%;To learn and demonstrate proper pursed lip breathing techniques or other breathing techniques.;To learn and demonstrate proper use of respiratory medications    Long  Term Goals  Exhibits compliance with exercise, home and travel O2 prescription;Verbalizes importance of monitoring SPO2 with pulse oximeter and return  demonstration;Maintenance of O2 saturations>88%;Exhibits proper breathing techniques, such as pursed lip breathing or other method taught during program session;Compliance with respiratory medication;Demonstrates proper use of MDI's    Comments  Reviewed PLB technique with pt.  Talked about how it works and it's importance in maintaining their exercise saturations.    Goals/Expected Outcomes  Short: Become more profiecient at using PLB.   Long: Become independent at using PLB.       Initial Exercise Prescription: Initial Exercise Prescription - 07/21/19 1400      Date of Initial Exercise RX and Referring Provider   Date  07/21/19    Referring Provider  Nehemiah Massed      Treadmill   MPH  2    Grade  0    Minutes  15    METs  2.5      Recumbant Bike   Level  1    RPM  60    Minutes  15    METs  2.5      NuStep   Level  2    SPM  80    Minutes  15    METs  2.5      REL-XR   Level  2    Speed  50    Minutes  15    METs  2.5      Prescription Details   Frequency (times per week)  3    Duration  Progress to 30 minutes of continuous aerobic without signs/symptoms of physical distress      Intensity   THRR 40-80% of Max Heartrate  95-132    Ratings of Perceived Exertion  11-15    Perceived Dyspnea  0-4      Resistance Training   Training Prescription  Yes    Weight  3 lb    Reps  10-15       Perform Capillary Blood Glucose checks as needed.  Exercise Prescription Changes: Exercise Prescription Changes    Row Name 07/21/19 1400             Response to Exercise   Blood Pressure (Admit)  128/74       Blood Pressure (Exercise)  124/66       Blood Pressure (Exit)  128/64       Heart Rate (Admit)  57 bpm       Heart Rate (Exercise)  83 bpm       Heart Rate (Exit)  67 bpm       Oxygen Saturation (Admit)  97 %       Oxygen Saturation (Exercise)  89 %       Oxygen  Saturation (Exit)  94 %       Rating of Perceived Exertion (Exercise)  13       Perceived Dyspnea  (Exercise)  1       Symptoms  none          Exercise Comments:   Exercise Goals and Review: Exercise Goals    Row Name 07/21/19 1451             Exercise Goals   Increase Physical Activity  Yes       Intervention  Provide advice, education, support and counseling about physical activity/exercise needs.;Develop an individualized exercise prescription for aerobic and resistive training based on initial evaluation findings, risk stratification, comorbidities and participant's personal goals.       Expected Outcomes  Short Term: Attend rehab on a regular basis to increase amount of physical activity.;Long Term: Add in home exercise to make exercise part of routine and to increase amount of physical activity.;Long Term: Exercising regularly at least 3-5 days a week.       Increase Strength and Stamina  Yes       Intervention  Provide advice, education, support and counseling about physical activity/exercise needs.;Develop an individualized exercise prescription for aerobic and resistive training based on initial evaluation findings, risk stratification, comorbidities and participant's personal goals.       Expected Outcomes  Short Term: Increase workloads from initial exercise prescription for resistance, speed, and METs.;Short Term: Perform resistance training exercises routinely during rehab and add in resistance training at home;Long Term: Improve cardiorespiratory fitness, muscular endurance and strength as measured by increased METs and functional capacity (6MWT)       Able to understand and use rate of perceived exertion (RPE) scale  Yes       Intervention  Provide education and explanation on how to use RPE scale       Expected Outcomes  Short Term: Able to use RPE daily in rehab to express subjective intensity level;Long Term:  Able to use RPE to guide intensity level when exercising independently       Able to understand and use Dyspnea scale  Yes       Intervention  Provide education  and explanation on how to use Dyspnea scale       Expected Outcomes  Short Term: Able to use Dyspnea scale daily in rehab to express subjective sense of shortness of breath during exertion;Long Term: Able to use Dyspnea scale to guide intensity level when exercising independently       Knowledge and understanding of Target Heart Rate Range (THRR)  Yes       Intervention  Provide education and explanation of THRR including how the numbers were predicted and where they are located for reference       Expected Outcomes  Short Term: Able to state/look up THRR;Short Term: Able to use daily as guideline for intensity in rehab;Long Term: Able to use THRR to govern intensity when exercising independently       Able to check pulse independently  Yes       Intervention  Provide education and demonstration on how to check pulse in carotid and radial arteries.;Review the importance of being able to check your own pulse for safety during independent exercise       Expected Outcomes  Short Term: Able to explain why pulse checking is important during independent exercise;Long Term: Able to check pulse independently and accurately       Understanding of Exercise  Prescription  Yes       Intervention  Provide education, explanation, and written materials on patient's individual exercise prescription       Expected Outcomes  Short Term: Able to explain program exercise prescription;Long Term: Able to explain home exercise prescription to exercise independently          Exercise Goals Re-Evaluation : Exercise Goals Re-Evaluation    Row Name 07/29/19 1312 08/03/19 1511           Exercise Goal Re-Evaluation   Exercise Goals Review  --  Increase Physical Activity;Able to understand and use rate of perceived exertion (RPE) scale;Knowledge and understanding of Target Heart Rate Range (THRR);Understanding of Exercise Prescription;Increase Strength and Stamina;Able to understand and use Dyspnea scale;Able to check pulse  independently      Comments  Anouk is scheduled to start exercise next week as she has only attended orientation thus far.  Reviewed RPE scale, THR and program prescription with pt today.  Pt voiced understanding and was given a copy of goals to take home.      Expected Outcomes  --  Short: Use RPE daily to regulate intensity. Long: Follow program prescription in THR.         Discharge Exercise Prescription (Final Exercise Prescription Changes): Exercise Prescription Changes - 07/21/19 1400      Response to Exercise   Blood Pressure (Admit)  128/74    Blood Pressure (Exercise)  124/66    Blood Pressure (Exit)  128/64    Heart Rate (Admit)  57 bpm    Heart Rate (Exercise)  83 bpm    Heart Rate (Exit)  67 bpm    Oxygen Saturation (Admit)  97 %    Oxygen Saturation (Exercise)  89 %    Oxygen Saturation (Exit)  94 %    Rating of Perceived Exertion (Exercise)  13    Perceived Dyspnea (Exercise)  1    Symptoms  none       Nutrition:  Target Goals: Understanding of nutrition guidelines, daily intake of sodium <1573m, cholesterol <2082m calories 30% from fat and 7% or less from saturated fats, daily to have 5 or more servings of fruits and vegetables.  Biometrics: Pre Biometrics - 07/21/19 1452      Pre Biometrics   Height  5' 5.5" (1.664 m)    Weight  182 lb 9.6 oz (82.8 kg)    BMI (Calculated)  29.91    Single Leg Stand  8.99 seconds        Nutrition Therapy Plan and Nutrition Goals:   Nutrition Assessments:   Nutrition Goals Re-Evaluation:   Nutrition Goals Discharge (Final Nutrition Goals Re-Evaluation):   Psychosocial: Target Goals: Acknowledge presence or absence of significant depression and/or stress, maximize coping skills, provide positive support system. Participant is able to verbalize types and ability to use techniques and skills needed for reducing stress and depression.   Initial Review & Psychosocial Screening: Initial Psych Review & Screening -  07/21/19 1350      Initial Review   Current issues with  Current Depression;History of Depression;Current Anxiety/Panic;Current Sleep Concerns;Current Stress Concerns    Source of Stress Concerns  Chronic Illness;Family;Transportation    Comments  has insomnia, going through separation from spouse, needs gas card      [place] No    Strains  Intra-family strains    Comments  has one daughter who works 2 jobs      Barriers  Psychosocial barriers to participate in program  There are no identifiable barriers or psychosocial needs.      Screening Interventions   Interventions  Encouraged to exercise    Expected Outcomes  Short Term goal: Utilizing psychosocial counselor, staff and physician to assist with identification of specific Stressors or current issues interfering with healing process. Setting desired goal for each stressor or current issue identified.;Long Term Goal: Stressors or current issues are controlled or eliminated.;Short Term goal: Identification and review with participant of any Quality of Life or Depression concerns found by scoring the questionnaire.;Long Term goal: The participant improves quality of Life and PHQ9 Scores as seen by post scores and/or verbalization of changes       Quality of Life Scores: Quality of Life - 07/21/19 1447      Quality of Life   Select  Quality of Life      Quality of Life Scores   Health/Function Pre  17.43 %    Socioeconomic Pre  16.25 %    Psych/Spiritual Pre  19.71 %    Family Pre  22.17 %    GLOBAL Pre  18.2 %      Scores of 19 and below usually indicate a poorer quality of life in these areas.  A difference of  2-3 points is a clinically meaningful difference.  A difference of 2-3 points in the total score of the Quality of Life Index has been associated with significant improvement in overall quality of life, self-image, physical symptoms, and general health in studies assessing change in quality  of life.  PHQ-9: Recent Review Flowsheet Data    Depression screen [facility] 2/9 07/21/2019   Decreased Interest 1   Down, Depressed, Hopeless 1   PHQ - 2 Score 2   Altered sleeping 1   Tired, decreased energy 1   Change in appetite 2   Feeling bad or failure about yourself  2   Trouble concentrating 1   Moving slowly or fidgety/restless 0   Suicidal thoughts 0   PHQ-9 Score 9   Difficult doing work/chores Somewhat difficult     Interpretation of Total Score  Total Score Depression Severity:  1-4 = Minimal depression, 5-9 = Mild depression, 10-14 = Moderate depression, 15-19 = Moderately severe depression, 20-27 = Severe depression   Psychosocial Evaluation and Intervention:   Psychosocial Re-Evaluation:   Psychosocial Discharge (Final Psychosocial Re-Evaluation):   Education: Education Goals: Education classes will be provided on a weekly basis, covering required topics. Participant will state understanding/return demonstration of topics presented.  Learning Barriers/Preferences:   Education Topics:  Initial Evaluation Education: - Verbal, written and demonstration of respiratory meds, oximetry and breathing techniques. Instruction on use of nebulizers and MDIs and importance of monitoring MDI activations.   General Nutrition Guidelines/Fats and Fiber: -Group instruction provided by verbal, written material, models and posters to present the general guidelines for heart healthy nutrition. Gives an explanation and review of dietary fats and fiber.   Controlling Sodium/Reading Food Labels: -Group verbal and written material supporting the discussion of sodium use in heart healthy nutrition. Review and explanation with models, verbal and written materials for utilization of the food label.   Exercise Physiology & General Exercise Guidelines: - Group verbal and written instruction with models to review the exercise physiology of the cardiovascular system and associated  critical values. Provides general exercise guidelines with specific guidelines to those with heart or lung disease.    Aerobic Exercise & Resistance Training: - Gives group verbal  and written instruction on the various components of exercise. Focuses on aerobic and resistive training programs and the benefits of this training and how to safely progress through these programs.   Flexibility, Balance, Mind/Body Relaxation: Provides group verbal/written instruction on the benefits of flexibility and balance training, including mind/body exercise modes such as yoga, pilates and tai chi.  Demonstration and skill practice provided.   Stress and Anxiety: - Provides group verbal and written instruction about the health risks of elevated stress and causes of high stress.  Discuss the correlation between heart/lung disease and anxiety and treatment options. Review healthy ways to manage with stress and anxiety.   Depression: - Provides group verbal and written instruction on the correlation between heart/lung disease and depressed mood, treatment options, and the stigmas associated with seeking treatment.   Exercise & Equipment Safety: - Individual verbal instruction and demonstration of equipment use and safety with use of the equipment.   Pulmonary Rehab from 07/21/2019 in [facility] Cardiac and Pulmonary Rehab  Date  07/21/19  Educator  AS  Instruction Review Code  1- Verbalizes Understanding      Infection Prevention: - Provides verbal and written material to individual with discussion of infection control including proper hand washing and proper equipment cleaning during exercise session.   Pulmonary Rehab from 07/21/2019 in [facility] Cardiac and Pulmonary Rehab  Date  07/21/19  Educator  AS  Instruction Review Code  1- Verbalizes Understanding      Falls Prevention: - Provides verbal and written material to individual with discussion of falls prevention and safety.   Pulmonary Rehab from 07/21/2019  in [facility] Cardiac and Pulmonary Rehab  Date  07/21/19  Educator  AS  Instruction Review Code  1- Verbalizes Understanding      Diabetes: - Individual verbal and written instruction to review signs/symptoms of diabetes, desired ranges of glucose level fasting, after meals and with exercise. Advice that pre and post exercise glucose checks will be done for 3 sessions at entry of program.   Chronic Lung Diseases: - Group verbal and written instruction to review updates, respiratory medications, advancements in procedures and treatments. Discuss use of supplemental oxygen including available portable oxygen systems, continuous and intermittent flow rates, concentrators, personal use and safety guidelines. Review proper use of inhaler and spacers. Provide informative websites for self-education.    Energy Conservation: - Provide group verbal and written instruction for methods to conserve energy, plan and organize activities. Instruct on pacing techniques, use of adaptive equipment and posture/positioning to relieve shortness of breath.   Triggers and Exacerbations: - Group verbal and written instruction to review types of environmental triggers and ways to prevent exacerbations. Discuss weather changes, air quality and the benefits of nasal washing. Review warning signs and symptoms to help prevent infections. Discuss techniques for effective airway clearance, coughing, and vibrations.   AED/CPR: - Group verbal and written instruction with the use of models to demonstrate the basic use of the AED with the basic ABC's of resuscitation.   Anatomy and Physiology of the Lungs: - Group verbal and written instruction with the use of models to provide basic lung anatomy and physiology related to function, structure and complications of lung disease.   Anatomy & Physiology of the Heart: - Group verbal and written instruction and models provide basic cardiac anatomy and physiology, with the coronary  electrical and arterial systems. Review of Valvular disease and Heart Failure   Cardiac Medications: - Group verbal and written instruction to review commonly prescribed medications  for heart disease. Reviews the medication, class of the drug, and side effects.   Know Your Numbers and Risk Factors: -Group verbal and written instruction about important numbers in your health.  Discussion of what are risk factors and how they play a role in the disease process.  Review of Cholesterol, Blood Pressure, Diabetes, and BMI and the role they play in your overall health.   Sleep Hygiene: -Provides group verbal and written instruction about how sleep can affect your health.  Define sleep hygiene, discuss sleep cycles and impact of sleep habits. Review good sleep hygiene tips.    Other: -Provides group and verbal instruction on various topics (see comments)    Knowledge Questionnaire Score: Knowledge Questionnaire Score - 07/21/19 1441      Knowledge Questionnaire Score   Pre Score  21/26 (heart)        Core Components/Risk Factors/Patient Goals at Admission: Personal Goals and Risk Factors at Admission - 07/21/19 1449      Core Components/Risk Factors/Patient Goals on Admission    Weight Management  Weight Loss;Yes    Intervention  Weight Management: Develop a combined nutrition and exercise program designed to reach desired caloric intake, while maintaining appropriate intake of nutrient and fiber, sodium and fats, and appropriate energy expenditure required for the weight goal.;Weight Management: Provide education and appropriate resources to help participant work on and attain dietary goals.;Weight Management/Obesity: Establish reasonable short term and long term weight goals.    Admit Weight  182 lb 9.6 oz (82.8 kg)    Goal Weight: Short Term  172 lb (78 kg)    Goal Weight: Long Term  162 lb (73.5 kg)    Expected Outcomes  Short Term: Continue to assess and modify interventions until  short term weight is achieved;Long Term: Adherence to nutrition and physical activity/exercise program aimed toward attainment of established weight goal;Weight Loss: Understanding of general recommendations for a balanced deficit meal plan, which promotes 1-2 lb weight loss per week and includes a negative energy balance of [phone] kcal/d;Understanding recommendations for meals to include 15-35% energy as protein, 25-35% energy from fat, 35-60% energy from carbohydrates, less than '200mg'$  of dietary cholesterol, 20-35 gm of total fiber daily;Understanding of distribution of calorie intake throughout the day with the consumption of 4-5 meals/snacks    Improve shortness of breath with ADL's  Yes    Intervention  Provide education, individualized exercise plan and daily activity instruction to help decrease symptoms of SOB with activities of daily living.    Expected Outcomes  Short Term: Improve cardiorespiratory fitness to achieve a reduction of symptoms when performing ADLs;Long Term: Be able to perform more ADLs without symptoms or delay the onset of symptoms    Heart Failure  Yes    Intervention  Provide a combined exercise and nutrition program that is supplemented with education, support and counseling about heart failure. Directed toward relieving symptoms such as shortness of breath, decreased exercise tolerance, and extremity edema.    Expected Outcomes  Improve functional capacity of life;Short term: Attendance in program 2-3 days a week with increased exercise capacity. Reported lower sodium intake. Reported increased fruit and vegetable intake. Reports medication compliance.;Short term: Daily weights obtained and reported for increase. Utilizing diuretic protocols set by physician.;Long term: Adoption of self-care skills and reduction of barriers for early signs and symptoms recognition and intervention leading to self-care maintenance.    Hypertension  Yes    Intervention  Provide education on  lifestyle modifcations including regular physical activity/exercise,  weight management, moderate sodium restriction and increased consumption of fresh fruit, vegetables, and low fat dairy, alcohol moderation, and smoking cessation.;Monitor prescription use compliance.    Expected Outcomes  Short Term: Continued assessment and intervention until BP is < 140/91m HG in hypertensive participants. < 130/823mHG in hypertensive participants with diabetes, heart failure or chronic kidney disease.;Long Term: Maintenance of blood pressure at goal levels.    Stress  Yes    Intervention  Offer individual and/or small group education and counseling on adjustment to heart disease, stress management and health-related lifestyle change. Teach and support self-help strategies.;Refer participants experiencing significant psychosocial distress to appropriate mental health specialists for further evaluation and treatment. When possible, include family members and significant others in education/counseling sessions.       Core Components/Risk Factors/Patient Goals Review:    Core Components/Risk Factors/Patient Goals at Discharge (Final Review):    ITP Comments: ITP Comments    Row Name 08/03/19 1510 08/12/19 1010         ITP Comments  First full day of exercise!  Patient was oriented to gym and equipment including functions, settings, policies, and procedures.  Patient's individual exercise prescription and treatment plan were reviewed.  All starting workloads were established based on the results of the 6 minute walk test done at initial orientation visit.  The plan for exercise progression was also introduced and progression will be customized based on patient's performance and goals.  30 day chart review completed. ITP sent to Dr M Zachery Dakinsedical Director, for review,changes as needed and signature.         Comments:

## 2019-08-12 NOTE — Progress Notes (Signed)
Daily Session Note  Patient Details  Name: Anna Romero MRN: [id number] Date of Birth: 31-Oct-1950 Referring Provider:     Pulmonary Rehab from 07/21/2019 in [facility] Cardiac and Pulmonary Rehab  Referring Provider  Nehemiah Massed      Encounter Date: 08/12/2019  Check In: Session Check In - 08/12/19 1531      Check-In   Staff Present  Heath Lark, RN, BSN, CCRP;Melissa Caiola RDN, LDN;Meredith Sherryll Burger, RN BSN    Virtual Visit  No    Medication changes reported      No    Fall or balance concerns reported     No    Warm-up and Cool-down  Performed on first and last piece of equipment    Resistance Training Performed  Yes    VAD Patient?  No    PAD/SET Patient?  No      Pain Assessment   Currently in Pain?  No/denies          Social History   Tobacco Use  Smoking Status Never Smoker  Smokeless Tobacco Never Used    Goals Met:  Proper associated with RPD/PD & O2 Sat Exercise tolerated well No report of cardiac concerns or symptoms  Goals Unmet:  Not Applicable  Comments: Pt able to follow exercise prescription today without complaint.  Will continue to monitor for progression.    Dr. Emily Filbert is Medical Director for [place] and [name].

## 2019-08-13 ENCOUNTER — Encounter: Payer: Medicare Other | Attending: Internal Medicine

## 2019-08-13 DIAGNOSIS — I130 Hypertensive heart and chronic kidney disease with heart failure and stage 1 through stage 4 chronic kidney disease, or unspecified chronic kidney disease: Secondary | ICD-10-CM | POA: Insufficient documentation

## 2019-08-13 DIAGNOSIS — F419 Anxiety disorder, unspecified: Secondary | ICD-10-CM | POA: Insufficient documentation

## 2019-08-13 DIAGNOSIS — N183 Chronic kidney disease, stage 3 unspecified: Secondary | ICD-10-CM | POA: Insufficient documentation

## 2019-08-13 DIAGNOSIS — E785 Hyperlipidemia, unspecified: Secondary | ICD-10-CM | POA: Insufficient documentation

## 2019-08-13 DIAGNOSIS — D472 Monoclonal gammopathy: Secondary | ICD-10-CM | POA: Insufficient documentation

## 2019-08-13 DIAGNOSIS — I272 Pulmonary hypertension, unspecified: Secondary | ICD-10-CM | POA: Insufficient documentation

## 2019-08-13 DIAGNOSIS — I5022 Chronic systolic (congestive) heart failure: Secondary | ICD-10-CM | POA: Insufficient documentation

## 2019-08-13 DIAGNOSIS — F329 Major depressive disorder, single episode, unspecified: Secondary | ICD-10-CM | POA: Insufficient documentation

## 2019-08-13 DIAGNOSIS — Z79899 Other long term (current) drug therapy: Secondary | ICD-10-CM | POA: Insufficient documentation

## 2019-08-17 ENCOUNTER — Other Ambulatory Visit: Payer: Self-pay

## 2019-08-17 ENCOUNTER — Encounter: Payer: Medicare Other | Admitting: *Deleted

## 2019-08-17 ENCOUNTER — Ambulatory Visit: Payer: Medicare Other | Admitting: Pulmonary Disease

## 2019-08-17 DIAGNOSIS — I5022 Chronic systolic (congestive) heart failure: Secondary | ICD-10-CM

## 2019-08-17 DIAGNOSIS — I130 Hypertensive heart and chronic kidney disease with heart failure and stage 1 through stage 4 chronic kidney disease, or unspecified chronic kidney disease: Secondary | ICD-10-CM | POA: Diagnosis not present

## 2019-08-17 NOTE — Progress Notes (Signed)
Daily Session Note  Patient Details  Name: Anna Romero MRN: [id number] Date of Birth: Jan 15, 1951 Referring Provider:     Pulmonary Rehab from 07/21/2019 in [facility] Cardiac and Pulmonary Rehab  Referring Provider  Nehemiah Massed      Encounter Date: 08/17/2019  Check In: Session Check In - 08/17/19 1512      Check-In   Supervising physician immediately available to respond to emergencies  See telemetry face sheet for immediately available ER MD    Location  [facility]-Cardiac & Pulmonary Rehab    Staff Present  Justin Mend RCP,RRT,BSRT;Meredith Sherryll Burger, RN Moises Blood, BS, ACSM CEP, Exercise Physiologist    Virtual Visit  No    Medication changes reported      No    Fall or balance concerns reported     No    Warm-up and Cool-down  Performed on first and last piece of equipment    Resistance Training Performed  Yes    VAD Patient?  No    PAD/SET Patient?  No      Pain Assessment   Currently in Pain?  No/denies          Social History   Tobacco Use  Smoking Status Never Smoker  Smokeless Tobacco Never Used    Goals Met:  Independence with exercise equipment Exercise tolerated well No report of cardiac concerns or symptoms Strength training completed today  Goals Unmet:  Not Applicable  Comments: Pt able to follow exercise prescription today without complaint.  Will continue to monitor for progression.    Dr. Emily Filbert is Medical Director for [place] and [name].

## 2019-08-18 ENCOUNTER — Telehealth: Payer: Self-pay | Admitting: Pulmonary Disease

## 2019-08-18 NOTE — Telephone Encounter (Signed)
Called patient directly and spoke with her.  Patient never presented to [facility] to obtain the chest x-ray as indicated on 08/03/2019.  Patient to present to [facility] to get the x-ray completed tomorrow.  All questions were answered.  Patient reports that she is feeling better.  She still wants to proceed forward with getting the x-ray.  Patient has upcoming appointment scheduled on 09/10/2019 with Dr. Patsey Berthold.Nothing further Silvana Newness, FNP

## 2019-08-19 ENCOUNTER — Other Ambulatory Visit: Payer: Self-pay

## 2019-08-19 ENCOUNTER — Encounter: Payer: Medicare Other | Admitting: *Deleted

## 2019-08-19 DIAGNOSIS — I5022 Chronic systolic (congestive) heart failure: Secondary | ICD-10-CM

## 2019-08-19 DIAGNOSIS — I130 Hypertensive heart and chronic kidney disease with heart failure and stage 1 through stage 4 chronic kidney disease, or unspecified chronic kidney disease: Secondary | ICD-10-CM | POA: Diagnosis not present

## 2019-08-19 NOTE — Progress Notes (Signed)
Daily Session Note  Patient Details  Name: AUNDREA HORACE MRN: [id number] Date of Birth: [birth year] Referring Provider:     Pulmonary Rehab from 07/21/2019 in [facility] Cardiac and Pulmonary Rehab  Referring Provider  Nehemiah Massed      Encounter Date: 08/19/2019  Check In: Session Check In - 08/19/19 1601      Check-In   Staff Present  Nyoka Cowden, RN, BSN, MA;Meredith Sherryll Burger, RN BSN;Melissa Caiola RDN, LDN    Virtual Visit  No    Medication changes reported      No    Fall or balance concerns reported     No    Warm-up and Cool-down  Performed on first and last piece of equipment    Resistance Training Performed  Yes    VAD Patient?  No    PAD/SET Patient?  No      Pain Assessment   Currently in Pain?  No/denies          Social History   Tobacco Use  Smoking Status Never Smoker  Smokeless Tobacco Never Used    Goals Met:  Independence with exercise equipment Exercise tolerated well No report of cardiac concerns or symptoms  Goals Unmet:  Not Applicable  Comments: Pt able to follow exercise prescription today without complaint.  Will continue to monitor for progression.   Dr. Emily Filbert is Medical Director for [place] and [name].

## 2019-08-20 ENCOUNTER — Other Ambulatory Visit: Payer: Self-pay

## 2019-08-20 ENCOUNTER — Encounter: Payer: Medicare Other | Attending: Internal Medicine | Admitting: *Deleted

## 2019-08-20 DIAGNOSIS — D472 Monoclonal gammopathy: Secondary | ICD-10-CM | POA: Diagnosis not present

## 2019-08-20 DIAGNOSIS — F419 Anxiety disorder, unspecified: Secondary | ICD-10-CM | POA: Insufficient documentation

## 2019-08-20 DIAGNOSIS — E785 Hyperlipidemia, unspecified: Secondary | ICD-10-CM | POA: Diagnosis not present

## 2019-08-20 DIAGNOSIS — N183 Chronic kidney disease, stage 3 unspecified: Secondary | ICD-10-CM | POA: Insufficient documentation

## 2019-08-20 DIAGNOSIS — I272 Pulmonary hypertension, unspecified: Secondary | ICD-10-CM | POA: Insufficient documentation

## 2019-08-20 DIAGNOSIS — F329 Major depressive disorder, single episode, unspecified: Secondary | ICD-10-CM | POA: Insufficient documentation

## 2019-08-20 DIAGNOSIS — Z79899 Other long term (current) drug therapy: Secondary | ICD-10-CM | POA: Diagnosis not present

## 2019-08-20 DIAGNOSIS — I5022 Chronic systolic (congestive) heart failure: Secondary | ICD-10-CM | POA: Diagnosis present

## 2019-08-20 DIAGNOSIS — I130 Hypertensive heart and chronic kidney disease with heart failure and stage 1 through stage 4 chronic kidney disease, or unspecified chronic kidney disease: Secondary | ICD-10-CM | POA: Diagnosis not present

## 2019-08-20 NOTE — Progress Notes (Signed)
Daily Session Note  Patient Details  Name: Anna Romero MRN: [id number] Date of Birth: 05-23-1950 Referring Provider:     Pulmonary Rehab from 07/21/2019 in [facility] Cardiac and Pulmonary Rehab  Referring Provider  Nehemiah Massed      Encounter Date: 08/20/2019  Check In: Session Check In - 08/20/19 1502      Check-In   Supervising physician immediately available to respond to emergencies  See telemetry face sheet for immediately available ER MD    Location  [facility]-Cardiac & Pulmonary Rehab    Staff Present  Renita Papa, RN BSN;Joseph [address] [place], [place], [place], CCRP, CCET    Virtual Visit  No    Medication changes reported      No    Fall or balance concerns reported     No    Warm-up and Cool-down  Performed on first and last piece of equipment    Resistance Training Performed  Yes    VAD Patient?  No    PAD/SET Patient?  No      Pain Assessment   Currently in Pain?  No/denies          Social History   Tobacco Use  Smoking Status Never Smoker  Smokeless Tobacco Never Used    Goals Met:  Independence with exercise equipment Exercise tolerated well No report of cardiac concerns or symptoms Strength training completed today  Goals Unmet:  Not Applicable  Comments: Pt able to follow exercise prescription today without complaint.  Will continue to monitor for progression.    Dr. Emily Filbert is Medical Director for [place] and [name].

## 2019-08-24 ENCOUNTER — Encounter: Payer: Medicare Other | Admitting: *Deleted

## 2019-08-24 ENCOUNTER — Other Ambulatory Visit: Payer: Self-pay

## 2019-08-24 DIAGNOSIS — I130 Hypertensive heart and chronic kidney disease with heart failure and stage 1 through stage 4 chronic kidney disease, or unspecified chronic kidney disease: Secondary | ICD-10-CM | POA: Diagnosis not present

## 2019-08-24 DIAGNOSIS — I5022 Chronic systolic (congestive) heart failure: Secondary | ICD-10-CM

## 2019-08-24 NOTE — Progress Notes (Signed)
Daily Session Note  Patient Details  Name: TAMBRIA PFANNENSTIEL MRN: [id number] Date of Birth: January 21, 1951 Referring Provider:     Pulmonary Rehab from 07/21/2019 in [facility] Cardiac and Pulmonary Rehab  Referring Provider  Nehemiah Massed      Encounter Date: 08/24/2019  Check In: Session Check In - 08/24/19 1511      Check-In   Supervising physician immediately available to respond to emergencies  See telemetry face sheet for immediately available ER MD    Location  [facility]-Cardiac & Pulmonary Rehab    Staff Present  Renita Papa, RN BSN;Joseph [address] [place], [place], ACSM CEP, Exercise Physiologist    Virtual Visit  No    Medication changes reported      No    Fall or balance concerns reported     No    Warm-up and Cool-down  Performed on first and last piece of equipment    Resistance Training Performed  Yes    VAD Patient?  No    PAD/SET Patient?  No      Pain Assessment   Currently in Pain?  No/denies          Social History   Tobacco Use  Smoking Status Never Smoker  Smokeless Tobacco Never Used    Goals Met:  Independence with exercise equipment Exercise tolerated well No report of cardiac concerns or symptoms Strength training completed today  Goals Unmet:  Not Applicable  Comments: Pt able to follow exercise prescription today without complaint.  Will continue to monitor for progression.    Dr. Emily Filbert is Medical Director for [place] and [name].

## 2019-08-26 ENCOUNTER — Other Ambulatory Visit: Payer: Self-pay

## 2019-08-26 ENCOUNTER — Encounter: Payer: Medicare Other | Admitting: *Deleted

## 2019-08-26 DIAGNOSIS — I5022 Chronic systolic (congestive) heart failure: Secondary | ICD-10-CM

## 2019-08-26 DIAGNOSIS — I130 Hypertensive heart and chronic kidney disease with heart failure and stage 1 through stage 4 chronic kidney disease, or unspecified chronic kidney disease: Secondary | ICD-10-CM | POA: Diagnosis not present

## 2019-08-26 NOTE — Progress Notes (Signed)
Daily Session Note  Patient Details  Name: Anna Romero MRN: [id number] Date of Birth: 01/02/1951 Referring Provider:     Pulmonary Rehab from 07/21/2019 in [facility] Cardiac and Pulmonary Rehab  Referring Provider  Anna Romero      Encounter Date: 08/26/2019  Check In: Session Check In - 08/26/19 1505      Check-In   Supervising physician immediately available to respond to emergencies  See telemetry face sheet for immediately available ER MD    Location  [facility]-Cardiac & Pulmonary Rehab    Staff Present  Anna Papa, RN BSN;Anna Romero RDN, Anna Romero, BA, ACSM CEP, Exercise Physiologist    Virtual Visit  No    Medication changes reported      No    Fall or balance concerns reported     No    Warm-up and Cool-down  Performed on first and last piece of equipment    Resistance Training Performed  Yes    VAD Patient?  No    PAD/SET Patient?  No      Pain Assessment   Currently in Pain?  No/denies          Social History   Tobacco Use  Smoking Status Never Smoker  Smokeless Tobacco Never Used    Goals Met:  Independence with exercise equipment Exercise tolerated well No report of cardiac concerns or symptoms Strength training completed today  Goals Unmet:  Not Applicable  Comments: Pt able to follow exercise prescription today without complaint.  Will continue to monitor for progression.    Dr. Emily Romero is Medical Director for [name] and [name].

## 2019-08-31 ENCOUNTER — Other Ambulatory Visit: Payer: Self-pay

## 2019-08-31 ENCOUNTER — Ambulatory Visit
Admission: RE | Admit: 2019-08-31 | Discharge: 2019-08-31 | Disposition: A | Discharge status: Home / Self Care | Payer: Medicare Other | Source: Ambulatory Visit | Attending: Pulmonary Disease | Admitting: Pulmonary Disease

## 2019-08-31 ENCOUNTER — Encounter: Payer: Medicare Other | Admitting: *Deleted

## 2019-08-31 DIAGNOSIS — R05 Cough: Secondary | ICD-10-CM

## 2019-08-31 DIAGNOSIS — I130 Hypertensive heart and chronic kidney disease with heart failure and stage 1 through stage 4 chronic kidney disease, or unspecified chronic kidney disease: Secondary | ICD-10-CM | POA: Diagnosis not present

## 2019-08-31 DIAGNOSIS — I5022 Chronic systolic (congestive) heart failure: Secondary | ICD-10-CM

## 2019-08-31 DIAGNOSIS — R059 Cough, unspecified: Secondary | ICD-10-CM

## 2019-08-31 NOTE — Progress Notes (Signed)
Daily Session Note  Patient Details  Name: Anna Romero MRN: [id number] Date of Birth: 07-Jan-1951 Referring Provider:     Pulmonary Rehab from 07/21/2019 in [facility] Cardiac and Pulmonary Rehab  Referring Provider  Nehemiah Massed      Encounter Date: 08/31/2019  Check In: Session Check In - 08/31/19 1521      Check-In   Supervising physician immediately available to respond to emergencies  See telemetry face sheet for immediately available ER MD    Location  [facility]-Cardiac & Pulmonary Rehab    Staff Present  Renita Papa, RN Moises Blood, BS, ACSM CEP, Exercise Physiologist;Joseph Darrin [place], [place], RCEP, CCRP, CCET    Virtual Visit  No    Medication changes reported      No    Fall or balance concerns reported     No    Warm-up and Cool-down  Performed on first and last piece of equipment    Resistance Training Performed  Yes    VAD Patient?  No    PAD/SET Patient?  No      Pain Assessment   Currently in Pain?  No/denies          Social History   Tobacco Use  Smoking Status Never Smoker  Smokeless Tobacco Never Used    Goals Met:  Independence with exercise equipment Exercise tolerated well No report of cardiac concerns or symptoms Strength training completed today  Goals Unmet:  Not Applicable  Comments: Pt able to follow exercise prescription today without complaint.  Will continue to monitor for progression.    Dr. Emily Filbert is Medical Director for [place] and [name].

## 2019-09-01 ENCOUNTER — Other Ambulatory Visit: Payer: Self-pay

## 2019-09-01 DIAGNOSIS — I5022 Chronic systolic (congestive) heart failure: Secondary | ICD-10-CM

## 2019-09-01 NOTE — Progress Notes (Signed)
Completed Initial RD Eval 

## 2019-09-03 ENCOUNTER — Encounter: Payer: Medicare Other | Admitting: *Deleted

## 2019-09-03 ENCOUNTER — Other Ambulatory Visit: Payer: Self-pay

## 2019-09-03 DIAGNOSIS — I5022 Chronic systolic (congestive) heart failure: Secondary | ICD-10-CM

## 2019-09-03 DIAGNOSIS — I130 Hypertensive heart and chronic kidney disease with heart failure and stage 1 through stage 4 chronic kidney disease, or unspecified chronic kidney disease: Secondary | ICD-10-CM | POA: Diagnosis not present

## 2019-09-03 NOTE — Progress Notes (Signed)
Daily Session Note  Patient Details  Name: Anna Romero MRN: [id number] Date of Birth: 03-15-1951 Referring Provider:     Pulmonary Rehab from 07/21/2019 in [facility] Cardiac and Pulmonary Rehab  Referring Provider  Anna Romero      Encounter Date: 09/03/2019  Check In: Session Check In - 09/03/19 1523      Check-In   Supervising physician immediately available to respond to emergencies  See telemetry face sheet for immediately available ER MD    Location  [facility]-Cardiac & Pulmonary Rehab    Staff Present  Renita Papa, RN BSN;Joseph Foy [place], [place], ACSM CEP, Exercise Physiologist    Virtual Visit  No    Medication changes reported      No    Fall or balance concerns reported     No    Warm-up and Cool-down  Performed on first and last piece of equipment    Resistance Training Performed  Yes    VAD Patient?  No    PAD/SET Patient?  No      Pain Assessment   Currently in Pain?  No/denies          Social History   Tobacco Use  Smoking Status Never Smoker  Smokeless Tobacco Never Used    Goals Met:  Independence with exercise equipment Exercise tolerated well No report of cardiac concerns or symptoms Strength training completed today  Goals Unmet:  Not Applicable  Comments: Pt able to follow exercise prescription today without complaint.  Will continue to monitor for progression.    Dr. Emily Filbert is Medical Director for [name] and [name].

## 2019-09-07 ENCOUNTER — Encounter: Payer: Self-pay | Admitting: *Deleted

## 2019-09-07 DIAGNOSIS — I5022 Chronic systolic (congestive) heart failure: Secondary | ICD-10-CM

## 2019-09-09 ENCOUNTER — Encounter: Payer: Self-pay | Admitting: *Deleted

## 2019-09-09 DIAGNOSIS — I5022 Chronic systolic (congestive) heart failure: Secondary | ICD-10-CM

## 2019-09-09 NOTE — Progress Notes (Signed)
Pulmonary Individual Treatment Plan  Patient Details  Name: Anna Romero MRN: [id number] Date of Birth: May 13, 1951 Referring Provider:     Pulmonary Rehab from 07/21/2019 in [facility] Cardiac and Pulmonary Rehab  Referring Provider  Nehemiah Massed      Initial Encounter Date:    Pulmonary Rehab from 07/21/2019 in [facility] Cardiac and Pulmonary Rehab  Date  07/21/19      Visit Diagnosis: Chronic systolic CHF (congestive heart failure), NYHA class 2 ([place])  Patient's Home Medications on Admission:  Current Outpatient Medications:  .  acetaminophen (TYLENOL) 500 MG tablet, Take by mouth., Disp: , Rfl:  .  albuterol (VENTOLIN HFA) 108 (90 Base) MCG/ACT inhaler, Inhale 1-2 puffs into the lungs every 6 (six) hours as needed for wheezing or shortness of breath., Disp: 18 g, Rfl: 5 .  buPROPion (WELLBUTRIN XL) 150 MG 24 hr tablet, Take 150 mg by mouth daily. , Disp: , Rfl: 3 .  carvedilol (COREG) 12.5 MG tablet, Take 12.5 mg by mouth 2 (two) times daily with a meal., Disp: , Rfl:  .  Cholecalciferol (VITAMIN D3) 1000 units CAPS, Take by mouth., Disp: , Rfl:  .  clonazePAM (KLONOPIN) 0.5 MG tablet, Take 0.5 mg by mouth 2 (two) times daily as needed for anxiety., Disp: , Rfl:  .  donepezil (ARICEPT) 10 MG tablet, Take 10 mg by mouth at bedtime., Disp: , Rfl:  .  furosemide (LASIX) 20 MG tablet, Take 20 mg by mouth., Disp: , Rfl:  .  Multiple Vitamins-Minerals (MULTIVITAMIN WITH MINERALS) tablet, Take 1 tablet by mouth daily., Disp: , Rfl:  .  omeprazole (PRILOSEC) 20 MG capsule, Take 20 mg by mouth daily., Disp: , Rfl:  .  spironolactone (ALDACTONE) 25 MG tablet, Take 25 mg by mouth daily., Disp: , Rfl:  .  telmisartan (MICARDIS) 40 MG tablet, Take 40 mg by mouth daily., Disp: , Rfl:  .  traZODone (DESYREL) 100 MG tablet, Take 1-2 tablets by mouth as needed., Disp: , Rfl:   Past Medical History: Past Medical History:  Diagnosis Date  . Anxiety   . CHF (congestive heart failure) ([place])   . Chronic kidney  disease    Renal Insufficiency Stage 3  . Degenerative disc disease, cervical   . Depression   . GERD (gastroesophageal reflux disease)   . Hyperlipidemia   . Hypertension   . Mixed restrictive and obstructive lung disease ([place])    Predominantly restriction due to chronic volume loss in the left lung.  Mild small airways disease.  . Moderate mitral insufficiency   . Monoclonal gammopathy   . Polymyositis ([place])   . Pulmonary hypertension (HCC)     Tobacco Use: Social History   Tobacco Use  Smoking Status Never Smoker  Smokeless Tobacco Never Used    Labs: Recent Review Flowsheet Data    There is no flowsheet data to display.       Pulmonary Assessment Scores:   UCSD: Self-administered rating of dyspnea associated with activities of daily living (ADLs) 6-point scale (0 = "not at all" to 5 = "maximal or unable to do because of breathlessness")  Scoring Scores range from 0 to 120.  Minimally important difference is 5 units  CAT: CAT can identify the health impairment of COPD patients and is better correlated with disease progression.  CAT has a scoring range of zero to 40. The CAT score is classified into four groups of low (less than 10), medium (10 - 20), high (21-30) and very high (31-40)  based on the impact level of disease on health status. A CAT score over 10 suggests significant symptoms.  A worsening CAT score could be explained by an exacerbation, poor medication adherence, poor inhaler technique, or progression of COPD or comorbid conditions.  CAT MCID is 2 points  mMRC: mMRC (Modified Medical Research Council) Dyspnea Scale is used to assess the degree of baseline functional disability in patients of respiratory disease due to dyspnea. No minimal important difference is established. A decrease in score of 1 point or greater is considered a positive change.   Pulmonary Function Assessment:   Exercise Target Goals: Exercise Program Goal: Individual exercise  prescription set using results from initial 6 min walk test and THRR while considering  patient's activity barriers and safety.   Exercise Prescription Goal: Initial exercise prescription builds to 30-45 minutes a day of aerobic activity, 2-3 days per week.  Home exercise guidelines will be given to patient during program as part of exercise prescription that the participant will acknowledge.  Education: Aerobic Exercise & Resistance Training: - Gives group verbal and written instruction on the various components of exercise. Focuses on aerobic and resistive training programs and the benefits of this training and how to safely progress through these programs..   Education: Exercise & Equipment Safety: - Individual verbal instruction and demonstration of equipment use and safety with use of the equipment.   Pulmonary Rehab from 07/21/2019 in [facility] Cardiac and Pulmonary Rehab  Date  07/21/19  Educator  AS  Instruction Review Code  1- Verbalizes Understanding      Education: Exercise Physiology & General Exercise Guidelines: - Group verbal and written instruction with models to review the exercise physiology of the cardiovascular system and associated critical values. Provides general exercise guidelines with specific guidelines to those with heart or lung disease.    Education: Flexibility, Balance, Mind/Body Relaxation: Provides group verbal/written instruction on the benefits of flexibility and balance training, including mind/body exercise modes such as yoga, pilates and tai chi.  Demonstration and skill practice provided.   Activity Barriers & Risk Stratification: Activity Barriers & Cardiac Risk Stratification - 07/21/19 1351      Activity Barriers & Cardiac Risk Stratification   Activity Barriers  Right Hip Replacement       6 Minute Walk: 6 Minute Walk    Row Name 07/21/19 1421         6 Minute Walk   Phase  Initial     Distance  1250 feet     Walk Time  6 minutes     #  of Rest Breaks  0     MPH  2.36     METS  2.52     RPE  13     Perceived Dyspnea   1     VO2 Peak  8.8     Symptoms  No     Resting HR  57 bpm     Resting BP  128/74     Resting Oxygen Saturation   97 %     Exercise Oxygen Saturation  during 6 min walk  89 %     Max Ex. HR  83 bpm     Max Ex. BP  124/66     2 Minute Post BP  128/64       Interval HR   1 Minute HR  64     2 Minute HR  67     3 Minute HR  68  4 Minute HR  79     5 Minute HR  83     6 Minute HR  78     2 Minute Post HR  67     Interval Heart Rate?  Yes       Interval Oxygen   Interval Oxygen?  Yes     Baseline Oxygen Saturation %  97 %     1 Minute Oxygen Saturation %  95 %     1 Minute Liters of Oxygen  0 L     2 Minute Oxygen Saturation %  93 %     2 Minute Liters of Oxygen  0 L     3 Minute Oxygen Saturation %  89 %     3 Minute Liters of Oxygen  0 L     4 Minute Oxygen Saturation %  89 %     4 Minute Liters of Oxygen  0 L     5 Minute Oxygen Saturation %  91 %     5 Minute Liters of Oxygen  0 L     6 Minute Oxygen Saturation %  91 %     6 Minute Liters of Oxygen  0 L     2 Minute Post Oxygen Saturation %  94 %     2 Minute Post Liters of Oxygen  0 L       Oxygen Initial Assessment: Oxygen Initial Assessment - 07/21/19 1453      Home Oxygen   Home Oxygen Device  None    Sleep Oxygen Prescription  CPAP    Home Exercise Oxygen Prescription  None    Home at Rest Exercise Oxygen Prescription  None    Compliance with Home Oxygen Use  Yes      Initial 6 min Walk   Oxygen Used  None      Program Oxygen Prescription   Program Oxygen Prescription  None      Intervention   Short Term Goals  To learn and exhibit compliance with exercise, home and travel O2 prescription;To learn and understand importance of monitoring SPO2 with pulse oximeter and demonstrate accurate use of the pulse oximeter.;To learn and understand importance of maintaining oxygen saturations>88%;To learn and demonstrate proper  pursed lip breathing techniques or other breathing techniques.;To learn and demonstrate proper use of respiratory medications    Long  Term Goals  Exhibits compliance with exercise, home and travel O2 prescription;Verbalizes importance of monitoring SPO2 with pulse oximeter and return demonstration;Maintenance of O2 saturations>88%;Exhibits proper breathing techniques, such as pursed lip breathing or other method taught during program session;Compliance with respiratory medication;Demonstrates proper use of MDI's       Oxygen Re-Evaluation: Oxygen Re-Evaluation    Row Name 08/03/19 1512             Program Oxygen Prescription   Program Oxygen Prescription  None         Home Oxygen   Home Oxygen Device  None       Sleep Oxygen Prescription  CPAP       Home Exercise Oxygen Prescription  None       Home at Rest Exercise Oxygen Prescription  None       Compliance with Home Oxygen Use  Yes         Goals/Expected Outcomes   Short Term Goals  To learn and exhibit compliance with exercise, home and travel O2 prescription;To learn and understand importance of monitoring SPO2 with pulse  oximeter and demonstrate accurate use of the pulse oximeter.;To learn and understand importance of maintaining oxygen saturations>88%;To learn and demonstrate proper pursed lip breathing techniques or other breathing techniques.;To learn and demonstrate proper use of respiratory medications       Long  Term Goals  Exhibits compliance with exercise, home and travel O2 prescription;Verbalizes importance of monitoring SPO2 with pulse oximeter and return demonstration;Maintenance of O2 saturations>88%;Exhibits proper breathing techniques, such as pursed lip breathing or other method taught during program session;Compliance with respiratory medication;Demonstrates proper use of MDI's       Comments  Reviewed PLB technique with pt.  Talked about how it works and it's importance in maintaining their exercise saturations.        Goals/Expected Outcomes  Short: Become more profiecient at using PLB.   Long: Become independent at using PLB.          Oxygen Discharge (Final Oxygen Re-Evaluation): Oxygen Re-Evaluation - 08/03/19 1512      Program Oxygen Prescription   Program Oxygen Prescription  None      Home Oxygen   Home Oxygen Device  None    Sleep Oxygen Prescription  CPAP    Home Exercise Oxygen Prescription  None    Home at Rest Exercise Oxygen Prescription  None    Compliance with Home Oxygen Use  Yes      Goals/Expected Outcomes   Short Term Goals  To learn and exhibit compliance with exercise, home and travel O2 prescription;To learn and understand importance of monitoring SPO2 with pulse oximeter and demonstrate accurate use of the pulse oximeter.;To learn and understand importance of maintaining oxygen saturations>88%;To learn and demonstrate proper pursed lip breathing techniques or other breathing techniques.;To learn and demonstrate proper use of respiratory medications    Long  Term Goals  Exhibits compliance with exercise, home and travel O2 prescription;Verbalizes importance of monitoring SPO2 with pulse oximeter and return demonstration;Maintenance of O2 saturations>88%;Exhibits proper breathing techniques, such as pursed lip breathing or other method taught during program session;Compliance with respiratory medication;Demonstrates proper use of MDI's    Comments  Reviewed PLB technique with pt.  Talked about how it works and it's importance in maintaining their exercise saturations.    Goals/Expected Outcomes  Short: Become more profiecient at using PLB.   Long: Become independent at using PLB.       Initial Exercise Prescription: Initial Exercise Prescription - 07/21/19 1400      Date of Initial Exercise RX and Referring Provider   Date  07/21/19    Referring Provider  Nehemiah Massed      Treadmill   MPH  2    Grade  0    Minutes  15    METs  2.5      Recumbant Bike   Level  1     RPM  60    Minutes  15    METs  2.5      NuStep   Level  2    SPM  80    Minutes  15    METs  2.5      REL-XR   Level  2    Speed  50    Minutes  15    METs  2.5      Prescription Details   Frequency (times per week)  3    Duration  Progress to 30 minutes of continuous aerobic without signs/symptoms of physical distress      Intensity   THRR 40-80% of Max  Heartrate  95-132    Ratings of Perceived Exertion  11-15    Perceived Dyspnea  0-4      Resistance Training   Training Prescription  Yes    Weight  3 lb    Reps  10-15       Perform Capillary Blood Glucose checks as needed.  Exercise Prescription Changes: Exercise Prescription Changes    Row Name 07/21/19 1400 08/13/19 1300 08/28/19 1300         Response to Exercise   Blood Pressure (Admit)  128/74  102/64  128/58     Blood Pressure (Exercise)  124/66  128/64  122/58     Blood Pressure (Exit)  128/64  115/60  --     Heart Rate (Admit)  57 bpm  60 bpm  68 bpm     Heart Rate (Exercise)  83 bpm  76 bpm  80 bpm     Heart Rate (Exit)  67 bpm  84 bpm  67 bpm     Oxygen Saturation (Admit)  97 %  98 %  97 %     Oxygen Saturation (Exercise)  89 %  92 %  93 %     Oxygen Saturation (Exit)  94 %  98 %  96 %     Rating of Perceived Exertion (Exercise)  13  --  13     Perceived Dyspnea (Exercise)  1  --  1     Symptoms  none  --  --     Duration  --  Continue with 30 min of aerobic exercise without signs/symptoms of physical distress.  Continue with 30 min of aerobic exercise without signs/symptoms of physical distress.     Intensity  --  THRR unchanged  THRR unchanged       Progression   Progression  --  Continue to progress workloads to maintain intensity without signs/symptoms of physical distress.  Continue to progress workloads to maintain intensity without signs/symptoms of physical distress.     Average METs  --  2.25  2.53       Resistance Training   Training Prescription  --  Yes  Yes     Weight  --  3 lb  3  lb     Reps  --  10-15  10-15       Interval Training   Interval Training  --  No  No       Treadmill   MPH  --  2  2.3     Grade  --  0  0     Minutes  --  15  15     METs  --  2.53  2.76       REL-XR   Level  --  1  1     Speed  --  50  50     Minutes  --  15  15     METs  --  --  2.3        Exercise Comments:   Exercise Goals and Review: Exercise Goals    Row Name 07/21/19 1451             Exercise Goals   Increase Physical Activity  Yes       Intervention  Provide advice, education, support and counseling about physical activity/exercise needs.;Develop an individualized exercise prescription for aerobic and resistive training based on initial evaluation findings, risk stratification, comorbidities and participant's personal goals.  Expected Outcomes  Short Term: Attend rehab on a regular basis to increase amount of physical activity.;Long Term: Add in home exercise to make exercise part of routine and to increase amount of physical activity.;Long Term: Exercising regularly at least 3-5 days a week.       Increase Strength and Stamina  Yes       Intervention  Provide advice, education, support and counseling about physical activity/exercise needs.;Develop an individualized exercise prescription for aerobic and resistive training based on initial evaluation findings, risk stratification, comorbidities and participant's personal goals.       Expected Outcomes  Short Term: Increase workloads from initial exercise prescription for resistance, speed, and METs.;Short Term: Perform resistance training exercises routinely during rehab and add in resistance training at home;Long Term: Improve cardiorespiratory fitness, muscular endurance and strength as measured by increased METs and functional capacity (6MWT)       Able to understand and use rate of perceived exertion (RPE) scale  Yes       Intervention  Provide education and explanation on how to use RPE scale       Expected  Outcomes  Short Term: Able to use RPE daily in rehab to express subjective intensity level;Long Term:  Able to use RPE to guide intensity level when exercising independently       Able to understand and use Dyspnea scale  Yes       Intervention  Provide education and explanation on how to use Dyspnea scale       Expected Outcomes  Short Term: Able to use Dyspnea scale daily in rehab to express subjective sense of shortness of breath during exertion;Long Term: Able to use Dyspnea scale to guide intensity level when exercising independently       Knowledge and understanding of Target Heart Rate Range (THRR)  Yes       Intervention  Provide education and explanation of THRR including how the numbers were predicted and where they are located for reference       Expected Outcomes  Short Term: Able to state/look up THRR;Short Term: Able to use daily as guideline for intensity in rehab;Long Term: Able to use THRR to govern intensity when exercising independently       Able to check pulse independently  Yes       Intervention  Provide education and demonstration on how to check pulse in carotid and radial arteries.;Review the importance of being able to check your own pulse for safety during independent exercise       Expected Outcomes  Short Term: Able to explain why pulse checking is important during independent exercise;Long Term: Able to check pulse independently and accurately       Understanding of Exercise Prescription  Yes       Intervention  Provide education, explanation, and written materials on patient's individual exercise prescription       Expected Outcomes  Short Term: Able to explain program exercise prescription;Long Term: Able to explain home exercise prescription to exercise independently          Exercise Goals Re-Evaluation : Exercise Goals Re-Evaluation    Row Name 07/29/19 1312 08/03/19 1511 08/13/19 1305 08/28/19 1329       Exercise Goal Re-Evaluation   Exercise Goals Review   --  Increase Physical Activity;Able to understand and use rate of perceived exertion (RPE) scale;Knowledge and understanding of Target Heart Rate Range (THRR);Understanding of Exercise Prescription;Increase Strength and Stamina;Able to understand and use Dyspnea scale;Able to check  pulse independently  Increase Physical Activity;Increase Strength and Stamina;Able to understand and use rate of perceived exertion (RPE) scale;Able to understand and use Dyspnea scale;Knowledge and understanding of Target Heart Rate Range (THRR);Able to check pulse independently;Understanding of Exercise Prescription  Increase Physical Activity;Increase Strength and Stamina;Able to understand and use rate of perceived exertion (RPE) scale;Able to understand and use Dyspnea scale;Knowledge and understanding of Target Heart Rate Range (THRR);Able to check pulse independently;Understanding of Exercise Prescription    Comments  Haily is scheduled to start exercise next week as she has only attended orientation thus far.  Reviewed RPE scale, THR and program prescription with pt today.  Pt voiced understanding and was given a copy of goals to take home.  Opal has attended 3 sessions since orientation.  Consistent attendance will help her achieve better results.  Staff will monitor progress.  Jametta has increased speed on TM and works at [employer].  Staff will monitor progress.    Expected Outcomes  --  Short: Use RPE daily to regulate intensity. Long: Follow program prescription in THR.  Short: attend consistently Long: improve overall stamina  Short : attend consistently Long: increase MET level       Discharge Exercise Prescription (Final Exercise Prescription Changes): Exercise Prescription Changes - 08/28/19 1300      Response to Exercise   Blood Pressure (Admit)  128/58    Blood Pressure (Exercise)  122/58    Heart Rate (Admit)  68 bpm    Heart Rate (Exercise)  80 bpm    Heart Rate (Exit)  67 bpm    Oxygen Saturation  (Admit)  97 %    Oxygen Saturation (Exercise)  93 %    Oxygen Saturation (Exit)  96 %    Rating of Perceived Exertion (Exercise)  13    Perceived Dyspnea (Exercise)  1    Duration  Continue with 30 min of aerobic exercise without signs/symptoms of physical distress.    Intensity  THRR unchanged      Progression   Progression  Continue to progress workloads to maintain intensity without signs/symptoms of physical distress.    Average METs  2.53      Resistance Training   Training Prescription  Yes    Weight  3 lb    Reps  10-15      Interval Training   Interval Training  No      Treadmill   MPH  2.3    Grade  0    Minutes  15    METs  2.76      REL-XR   Level  1    Speed  50    Minutes  15    METs  2.3       Nutrition:  Target Goals: Understanding of nutrition guidelines, daily intake of sodium <1535m, cholesterol <2074m calories 30% from fat and 7% or less from saturated fats, daily to have 5 or more servings of fruits and vegetables.  Education: Controlling Sodium/Reading Food Labels -Group verbal and written material supporting the discussion of sodium use in heart healthy nutrition. Review and explanation with models, verbal and written materials for utilization of the food label.   Education: General Nutrition Guidelines/Fats and Fiber: -Group instruction provided by verbal, written material, models and posters to present the general guidelines for heart healthy nutrition. Gives an explanation and review of dietary fats and fiber.   Biometrics: Pre Biometrics - 07/21/19 1452      Pre Biometrics   Height  5' 5.5" (1.664 m)    Weight  182 lb 9.6 oz (82.8 kg)    BMI (Calculated)  29.91    Single Leg Stand  8.99 seconds        Nutrition Therapy Plan and Nutrition Goals: Nutrition Therapy & Goals - 09/01/19 1434      Nutrition Therapy   Diet  HH, Low Na    Protein (specify units)  65-70g    Fiber  25 grams    Whole Grain Foods  3 servings    Saturated  Fats  12 max. grams    Fruits and Vegetables  3 servings/day    Sodium  1.5 grams      Personal Nutrition Goals   Nutrition Goal  ST: mindful eating, increasing stress toolbox for coping LT: Increase fitness, decrease SOB (breathing is ok now, thinks due to less weight).    Comments  Pt reports eating too much and emotional eating. Discussed HH eating and mindful eating. Pt reports not having a working stove. Pt to write down what she has at her disposal and practice mindful eating and working on a stress toolbox. Discussed therapy suggested talking with her doctor regarding setting something up.      Intervention Plan   Intervention  Prescribe, educate and counsel regarding individualized specific dietary modifications aiming towards targeted core components such as weight, hypertension, lipid management, diabetes, heart failure and other comorbidities.;Nutrition handout(s) given to patient.    Expected Outcomes  Short Term Goal: Understand basic principles of dietary content, such as calories, fat, sodium, cholesterol and nutrients.;Short Term Goal: A plan has been developed with personal nutrition goals set during dietitian appointment.;Long Term Goal: Adherence to prescribed nutrition plan.       Nutrition Assessments:   MEDIFICTS Score Key:          ?70 Need to make dietary changes          40-70 Heart Healthy Diet         ? 40 Therapeutic Level Cholesterol Diet  Nutrition Goals Re-Evaluation:   Nutrition Goals Discharge (Final Nutrition Goals Re-Evaluation):   Psychosocial: Target Goals: Acknowledge presence or absence of significant depression and/or stress, maximize coping skills, provide positive support system. Participant is able to verbalize types and ability to use techniques and skills needed for reducing stress and depression.   Education: Depression - Provides group verbal and written instruction on the correlation between heart/lung disease and depressed mood,  treatment options, and the stigmas associated with seeking treatment.   Education: Sleep Hygiene -Provides group verbal and written instruction about how sleep can affect your health.  Define sleep hygiene, discuss sleep cycles and impact of sleep habits. Review good sleep hygiene tips.    Education: Stress and Anxiety: - Provides group verbal and written instruction about the health risks of elevated stress and causes of high stress.  Discuss the correlation between heart/lung disease and anxiety and treatment options. Review healthy ways to manage with stress and anxiety.   Initial Review & Psychosocial Screening: Initial Psych Review & Screening - 07/21/19 1350      Initial Review   Current issues with  Current Depression;History of Depression;Current Anxiety/Panic;Current Sleep Concerns;Current Stress Concerns    Source of Stress Concerns  Chronic Illness;Family;Transportation    Comments  has insomnia, going through separation from spouse, needs gas card      [place]?  No    Strains  Intra-family strains    Comments  has one daughter who works 2 jobs      Barriers   Psychosocial barriers to participate in program  There are no identifiable barriers or psychosocial needs.      Screening Interventions   Interventions  Encouraged to exercise    Expected Outcomes  Short Term goal: Utilizing psychosocial counselor, staff and physician to assist with identification of specific Stressors or current issues interfering with healing process. Setting desired goal for each stressor or current issue identified.;Long Term Goal: Stressors or current issues are controlled or eliminated.;Short Term goal: Identification and review with participant of any Quality of Life or Depression concerns found by scoring the questionnaire.;Long Term goal: The participant improves quality of Life and PHQ9 Scores as seen by post scores and/or verbalization of changes       Quality  of Life Scores: Quality of Life - 07/21/19 1447      Quality of Life   Select  Quality of Life      Quality of Life Scores   Health/Function Pre  17.43 %    Socioeconomic Pre  16.25 %    Psych/Spiritual Pre  19.71 %    Family Pre  22.17 %    GLOBAL Pre  18.2 %      Scores of 19 and below usually indicate a poorer quality of life in these areas.  A difference of  2-3 points is a clinically meaningful difference.  A difference of 2-3 points in the total score of the Quality of Life Index has been associated with significant improvement in overall quality of life, self-image, physical symptoms, and general health in studies assessing change in quality of life.  PHQ-9: Recent Review Flowsheet Data    Depression screen [facility] 2/9 07/21/2019   Decreased Interest 1   Down, Depressed, Hopeless 1   PHQ - 2 Score 2   Altered sleeping 1   Tired, decreased energy 1   Change in appetite 2   Feeling bad or failure about yourself  2   Trouble concentrating 1   Moving slowly or fidgety/restless 0   Suicidal thoughts 0   PHQ-9 Score 9   Difficult doing work/chores Somewhat difficult     Interpretation of Total Score  Total Score Depression Severity:  1-4 = Minimal depression, 5-9 = Mild depression, 10-14 = Moderate depression, 15-19 = Moderately severe depression, 20-27 = Severe depression   Psychosocial Evaluation and Intervention:   Psychosocial Re-Evaluation: Psychosocial Re-Evaluation    [place] Name 09/03/19 1515             Psychosocial Re-Evaluation   Current issues with  Current Sleep Concerns;Current Stress Concerns       Comments  Marlana doesnt sleep well at night - and gets very tired during the day.  She is having a sleep study done.  She also lives in [place] and it is a long drive.       Expected Outcomes  Short : staff will get her a gas card Long: develop better sleep patterns       Interventions  Encouraged to attend Pulmonary Rehabilitation for the exercise           Psychosocial Discharge (Final Psychosocial Re-Evaluation): Psychosocial Re-Evaluation - 09/03/19 1515      Psychosocial Re-Evaluation   Current issues with  Current Sleep Concerns;Current Stress Concerns    Comments  Ellisha doesnt sleep well at night - and gets very tired during the day.  She is having a sleep study done.  She also lives in [place] and it is a long drive.    Expected Outcomes  Short : staff will get her a gas card Long: develop better sleep patterns    Interventions  Encouraged to attend Pulmonary Rehabilitation for the exercise       Education: Education Goals: Education classes will be provided on a weekly basis, covering required topics. Participant will state understanding/return demonstration of topics presented.  Learning Barriers/Preferences:   General Pulmonary Education Topics:  Infection Prevention: - Provides verbal and written material to individual with discussion of infection control including proper hand washing and proper equipment cleaning during exercise session.   Pulmonary Rehab from 07/21/2019 in [facility] Cardiac and Pulmonary Rehab  Date  07/21/19  Educator  AS  Instruction Review Code  1- Verbalizes Understanding      Falls Prevention: - Provides verbal and written material to individual with discussion of falls prevention and safety.   Pulmonary Rehab from 07/21/2019 in [facility] Cardiac and Pulmonary Rehab  Date  07/21/19  Educator  AS  Instruction Review Code  1- Verbalizes Understanding      Chronic Lung Diseases: - Group verbal and written instruction to review updates, respiratory medications, advancements in procedures and treatments. Discuss use of supplemental oxygen including available portable oxygen systems, continuous and intermittent flow rates, concentrators, personal use and safety guidelines. Review proper use of inhaler and spacers. Provide informative websites for self-education.    Energy Conservation: - Provide  group verbal and written instruction for methods to conserve energy, plan and organize activities. Instruct on pacing techniques, use of adaptive equipment and posture/positioning to relieve shortness of breath.   Triggers and Exacerbations: - Group verbal and written instruction to review types of environmental triggers and ways to prevent exacerbations. Discuss weather changes, air quality and the benefits of nasal washing. Review warning signs and symptoms to help prevent infections. Discuss techniques for effective airway clearance, coughing, and vibrations.   AED/CPR: - Group verbal and written instruction with the use of models to demonstrate the basic use of the AED with the basic ABC's of resuscitation.   Anatomy and Physiology of the Lungs: - Group verbal and written instruction with the use of models to provide basic lung anatomy and physiology related to function, structure and complications of lung disease.   Anatomy & Physiology of the Heart: - Group verbal and written instruction and models provide basic cardiac anatomy and physiology, with the coronary electrical and arterial systems. Review of Valvular disease and Heart Failure   Cardiac Medications: - Group verbal and written instruction to review commonly prescribed medications for heart disease. Reviews the medication, class of the drug, and side effects.   Other: -Provides group and verbal instruction on various topics (see comments)   Knowledge Questionnaire Score: Knowledge Questionnaire Score - 07/21/19 1441      Knowledge Questionnaire Score   Pre Score  21/26 (heart)        Core Components/Risk Factors/Patient Goals at Admission: Personal Goals and Risk Factors at Admission - 07/21/19 1449      Core Components/Risk Factors/Patient Goals on Admission    Weight Management  Weight Loss;Yes    Intervention  Weight Management: Develop a combined nutrition and exercise program designed to reach desired  caloric intake, while maintaining appropriate intake of nutrient and fiber, sodium and fats, and appropriate energy expenditure required for the weight goal.;Weight Management: Provide education and appropriate resources to help participant work on and attain dietary goals.;Weight Management/Obesity: Establish reasonable  short term and long term weight goals.    Admit Weight  182 lb 9.6 oz (82.8 kg)    Goal Weight: Short Term  172 lb (78 kg)    Goal Weight: Long Term  162 lb (73.5 kg)    Expected Outcomes  Short Term: Continue to assess and modify interventions until short term weight is achieved;Long Term: Adherence to nutrition and physical activity/exercise program aimed toward attainment of established weight goal;Weight Loss: Understanding of general recommendations for a balanced deficit meal plan, which promotes 1-2 lb weight loss per week and includes a negative energy balance of [phone] kcal/d;Understanding recommendations for meals to include 15-35% energy as protein, 25-35% energy from fat, 35-60% energy from carbohydrates, less than 263m of dietary cholesterol, 20-35 gm of total fiber daily;Understanding of distribution of calorie intake throughout the day with the consumption of 4-5 meals/snacks    Improve shortness of breath with ADL's  Yes    Intervention  Provide education, individualized exercise plan and daily activity instruction to help decrease symptoms of SOB with activities of daily living.    Expected Outcomes  Short Term: Improve cardiorespiratory fitness to achieve a reduction of symptoms when performing ADLs;Long Term: Be able to perform more ADLs without symptoms or delay the onset of symptoms    Heart Failure  Yes    Intervention  Provide a combined exercise and nutrition program that is supplemented with education, support and counseling about heart failure. Directed toward relieving symptoms such as shortness of breath, decreased exercise tolerance, and extremity edema.     Expected Outcomes  Improve functional capacity of life;Short term: Attendance in program 2-3 days a week with increased exercise capacity. Reported lower sodium intake. Reported increased fruit and vegetable intake. Reports medication compliance.;Short term: Daily weights obtained and reported for increase. Utilizing diuretic protocols set by physician.;Long term: Adoption of self-care skills and reduction of barriers for early signs and symptoms recognition and intervention leading to self-care maintenance.    Hypertension  Yes    Intervention  Provide education on lifestyle modifcations including regular physical activity/exercise, weight management, moderate sodium restriction and increased consumption of fresh fruit, vegetables, and low fat dairy, alcohol moderation, and smoking cessation.;Monitor prescription use compliance.    Expected Outcomes  Short Term: Continued assessment and intervention until BP is < 140/92mHG in hypertensive participants. < 130/8077mG in hypertensive participants with diabetes, heart failure or chronic kidney disease.;Long Term: Maintenance of blood pressure at goal levels.    Stress  Yes    Intervention  Offer individual and/or small group education and counseling on adjustment to heart disease, stress management and health-related lifestyle change. Teach and support self-help strategies.;Refer participants experiencing significant psychosocial distress to appropriate mental health specialists for further evaluation and treatment. When possible, include family members and significant others in education/counseling sessions.       Education:Diabetes - Individual verbal and written instruction to review signs/symptoms of diabetes, desired ranges of glucose level fasting, after meals and with exercise. Acknowledge that pre and post exercise glucose checks will be done for 3 sessions at entry of program.   Education: Know Your Numbers and Risk Factors: -Group verbal and  written instruction about important numbers in your health.  Discussion of what are risk factors and how they play a role in the disease process.  Review of Cholesterol, Blood Pressure, Diabetes, and BMI and the role they play in your overall health.   Core Components/Risk Factors/Patient Goals Review:  Goals and Risk Factor  Review    Row Name 09/03/19 1519             Core Components/Risk Factors/Patient Goals Review   Personal Goals Review  Weight Management/Obesity;Heart Failure;Improve shortness of breath with ADL's;Develop more efficient breathing techniques such as purse lipped breathing and diaphragmatic breathing and practicing self-pacing with activity.       Review  Lois can tell she can get around better since starting to exercise.  She is concerned beacuse she doesnt sleep well and gets tired during the day - sometimes too sleepy to drive.  She has spoekn with the HF clinic about monitoring weight.       Expected Outcomes  Short:  weight consistently to monitor HF symptoms Long:  manage risk factors          Core Components/Risk Factors/Patient Goals at Discharge (Final Review):  Goals and Risk Factor Review - 09/03/19 1519      Core Components/Risk Factors/Patient Goals Review   Personal Goals Review  Weight Management/Obesity;Heart Failure;Improve shortness of breath with ADL's;Develop more efficient breathing techniques such as purse lipped breathing and diaphragmatic breathing and practicing self-pacing with activity.    Review  Oriyah can tell she can get around better since starting to exercise.  She is concerned beacuse she doesnt sleep well and gets tired during the day - sometimes too sleepy to drive.  She has spoekn with the HF clinic about monitoring weight.    Expected Outcomes  Short:  weight consistently to monitor HF symptoms Long:  manage risk factors       ITP Comments: ITP Comments    Row Name 08/03/19 1510 08/12/19 1010 09/01/19 1431 09/07/19 1510  09/09/19 1034   ITP Comments  First full day of exercise!  Patient was oriented to gym and equipment including functions, settings, policies, and procedures.  Patient's individual exercise prescription and treatment plan were reviewed.  All starting workloads were established based on the results of the 6 minute walk test done at initial orientation visit.  The plan for exercise progression was also introduced and progression will be customized based on patient's performance and goals.  30 day chart review completed. ITP sent to Dr Zachery Dakins Medical Director, for review,changes as needed and signature.  Completed Initial RD Eval  pt called out, she threw out her back and has a MD appt on Wed.  30 day chart review completed. ITP sent to Dr Zachery Dakins Medical Director, for review,changes as needed and signature. Continue with ITP if no changes requested      Comments:

## 2019-09-10 ENCOUNTER — Encounter: Payer: Self-pay | Admitting: Pulmonary Disease

## 2019-09-10 ENCOUNTER — Ambulatory Visit (INDEPENDENT_AMBULATORY_CARE_PROVIDER_SITE_OTHER): Payer: Medicare Other | Admitting: Pulmonary Disease

## 2019-09-10 ENCOUNTER — Other Ambulatory Visit: Payer: Self-pay

## 2019-09-10 VITALS — BP 138/70 | HR 56 | Temp 97.1°F | Ht 66.0 in | Wt 188.4 lb

## 2019-09-10 DIAGNOSIS — J439 Emphysema, unspecified: Secondary | ICD-10-CM | POA: Diagnosis not present

## 2019-09-10 DIAGNOSIS — I2721 Secondary pulmonary arterial hypertension: Secondary | ICD-10-CM | POA: Diagnosis not present

## 2019-09-10 DIAGNOSIS — R05 Cough: Secondary | ICD-10-CM | POA: Diagnosis not present

## 2019-09-10 DIAGNOSIS — M3320 Polymyositis, organ involvement unspecified: Secondary | ICD-10-CM

## 2019-09-10 DIAGNOSIS — R059 Cough, unspecified: Secondary | ICD-10-CM

## 2019-09-10 DIAGNOSIS — J984 Other disorders of lung: Secondary | ICD-10-CM

## 2019-09-10 DIAGNOSIS — J4520 Mild intermittent asthma, uncomplicated: Secondary | ICD-10-CM | POA: Diagnosis not present

## 2019-09-10 NOTE — Patient Instructions (Signed)
We will see him in follow-up in 6 months time call sooner should any new difficulties arise

## 2019-09-10 NOTE — Progress Notes (Signed)
Subjective:    Patient ID: Anna Romero, female    DOB: 04-07-1951, 69 y.o.   MRN: [id number]  Requesting MD/Service: Raechel Ache Date of initial consultation: 11/19/17, by Dr. Alva Garnet Reason for consultation: H/O asthma, ? H/O OSA, PAH  PT PROFILE: 69 y.o. female never smoker with documented h/o OSA (though PSG in 2016 revealed no OSA), documented h/o asthma and PAH by echocardiogram. 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 Previously followed for pulmonary issues by Dr Raul Del  DATA: PFTs 10/06/14 Jefm Bryant): FVC 2.64  (110% predicted),  FEV1 1.71 L  (88% predicted), FEV1/FVC 64%, TLC (77% pred), DLCO (72% pred), DLCO/VA (102% pred) PSG 03/02/15: no significant apnea. PLM index 9.1, PLM arousal index 1.7 Echo 05/24/15: LVEF 40%, global HK, moderate MR, RVSP 45 mmHg CT chest 12/02/15: Left lower lobe scarring with associated pleural thickening and trace pleural effusion, chronic. No evidence of acute cardiopulmonary disease Echo 07/09/17: LVEF 40%, global HK, moderate MR, RVSP 43 mmHg Overnight oximetry 11/2017: No significant nocturnal desaturations PFTs 01/14/2018: FVC: 2.07 > 2.12 L (66 > 68 %pred), FEV1: 1.40 > 1.49 L (56 > 60 %pred), FEV1/FVC: 68%, TLC: 3.54 L (68 %pred), DLCO 41 %pred, DLCO/VA 102% predicted.  No significant response to bronchodilator except for 34% improvement in FEF 25-75%   INTERVAL: Last seen  12/16/2018 by Dr. Alva Garnet.  Virtual visit with Wyn Quaker, NP 08/03/2019 new to me today.  No interval pulmonary issues since last seen.  HPI 69 year old lifelong never smoker who previously followed with Dr. Merton Border.  She has mixed obstructive and restrictive physiology on PFTs.  She also has issues with asthma.  No other salient issue is mild to moderate pulmonary artery hypertension by echocardiogram noted to be group 2 (due to left heart disease).  Coughing bronchial casts on occasion, no hemoptysis.  She notes that inhalers  improve her coughing.  She has not any chest pain, fevers, chills or sweats.  No orthopnea or paroxysmal nocturnal dyspnea, no lower extremity edema.  Review of Systems A 10 point review of systems was performed and it is as noted above otherwise negative.    Objective:   Physical Exam BP 138/70 (BP Location: Right Arm, Patient Position: Sitting, Cuff Size: Normal)   Pulse (!) 56   Temp (!) 97.1 F (36.2 C) (Temporal)   Ht 5\' 6"  (1.676 m)   Wt 188 lb 6.4 oz (85.5 kg)   SpO2 98%   BMI 30.41 kg/m  GENERAL: Awake, alert, fully ambulatory.  No respiratory distress. HEAD: Normocephalic, atraumatic.  EYES: Pupils equal, round, reactive to light.  No scleral icterus.  MOUTH: Nose/mouth/throat not examined due to masking requirements for COVID 19. NECK: Supple. No thyromegaly. No nodules. No JVD.  PULMONARY: Coarse breath sounds, slightly diminished on the left base.  No other adventitious sounds. CARDIOVASCULAR: S1 and S2. Regular rate and rhythm.  Grade 2/6 systolic ejection murmur at the axilla consistent with mitral regurgitation. GASTROINTESTINAL: Nondistended MUSCULOSKELETAL: No joint deformity, no clubbing, no edema.  NEUROLOGIC: No overt focal deficits noted.  No gait disturbance noted on ambulation. SKIN: Intact,warm,dry.  On limited exam no rashes. PSYCH: Flat affect, normal behavior.    Assessment & Plan:     ICD-10-CM   1. Mild intermittent asthma without complication  [id number]    I recommend that she resume using Symbicort REGULARLY Cough productive of bronchial casts is usually seen with asthma   2. Cough  R05    Likely  related to poorly compensated asthma Resume Symbicort twice a day  3. 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 Mild-mod PAH by echocardiogram - group 2 (due to L heart disease)  I27.21    This issue adds complexity to her  management Pulmonary hypertension can contribute to shortness of breath  5. 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  We will see her in follow-up in 6 months time she is to contact us prior to that time should any new difficulties arise.  Renold Don, MD [place] [facility]   *This note was dictated using voice recognition software/Dragon.  Despite best efforts to proofread, errors can occur which can change the meaning.  Any change was purely unintentional.

## 2019-09-14 ENCOUNTER — Encounter: Payer: Medicare Other | Admitting: *Deleted

## 2019-09-14 ENCOUNTER — Other Ambulatory Visit: Payer: Self-pay

## 2019-09-14 DIAGNOSIS — I5022 Chronic systolic (congestive) heart failure: Secondary | ICD-10-CM

## 2019-09-14 DIAGNOSIS — I130 Hypertensive heart and chronic kidney disease with heart failure and stage 1 through stage 4 chronic kidney disease, or unspecified chronic kidney disease: Secondary | ICD-10-CM | POA: Diagnosis not present

## 2019-09-14 NOTE — Progress Notes (Signed)
Daily Session Note  Patient Details  Name: Anna Romero MRN: [id number] Date of Birth: Jan 20, 1951 Referring Provider:     Pulmonary Rehab from 07/21/2019 in [facility] Cardiac and Pulmonary Rehab  Referring Provider  Nehemiah Massed      Encounter Date: 09/14/2019  Check In: Session Check In - 09/14/19 1519      Check-In   Supervising physician immediately available to respond to emergencies  See telemetry face sheet for immediately available ER MD    Location  [facility]-Cardiac & Pulmonary Rehab    Staff Present  Renita Papa, RN Moises Blood, BS, ACSM CEP, Exercise Physiologist;Joseph Tessie Fass RCP,RRT,BSRT    Virtual Visit  No    Medication changes reported      No    Fall or balance concerns reported     No    Warm-up and Cool-down  Performed on first and last piece of equipment    Resistance Training Performed  Yes    VAD Patient?  No    PAD/SET Patient?  No      Pain Assessment   Currently in Pain?  No/denies          Social History   Tobacco Use  Smoking Status Never Smoker  Smokeless Tobacco Never Used    Goals Met:  Independence with exercise equipment Exercise tolerated well No report of cardiac concerns or symptoms Strength training completed today  Goals Unmet:  Not Applicable  Comments: Pt able to follow exercise prescription today without complaint.  Will continue to monitor for progression.    Dr. Emily Filbert is Medical Director for [place] and [name].

## 2019-09-21 ENCOUNTER — Encounter: Payer: Medicare Other | Attending: Internal Medicine | Admitting: *Deleted

## 2019-09-21 ENCOUNTER — Other Ambulatory Visit: Payer: Self-pay

## 2019-09-21 DIAGNOSIS — E785 Hyperlipidemia, unspecified: Secondary | ICD-10-CM | POA: Insufficient documentation

## 2019-09-21 DIAGNOSIS — N183 Chronic kidney disease, stage 3 unspecified: Secondary | ICD-10-CM | POA: Diagnosis not present

## 2019-09-21 DIAGNOSIS — I272 Pulmonary hypertension, unspecified: Secondary | ICD-10-CM | POA: Diagnosis not present

## 2019-09-21 DIAGNOSIS — Z79899 Other long term (current) drug therapy: Secondary | ICD-10-CM | POA: Insufficient documentation

## 2019-09-21 DIAGNOSIS — F419 Anxiety disorder, unspecified: Secondary | ICD-10-CM | POA: Insufficient documentation

## 2019-09-21 DIAGNOSIS — F329 Major depressive disorder, single episode, unspecified: Secondary | ICD-10-CM | POA: Diagnosis not present

## 2019-09-21 DIAGNOSIS — I130 Hypertensive heart and chronic kidney disease with heart failure and stage 1 through stage 4 chronic kidney disease, or unspecified chronic kidney disease: Secondary | ICD-10-CM | POA: Diagnosis not present

## 2019-09-21 DIAGNOSIS — D472 Monoclonal gammopathy: Secondary | ICD-10-CM | POA: Diagnosis not present

## 2019-09-21 DIAGNOSIS — I5022 Chronic systolic (congestive) heart failure: Secondary | ICD-10-CM | POA: Diagnosis present

## 2019-09-21 NOTE — Progress Notes (Signed)
Daily Session Note  Patient Details  Name: Anna Romero MRN: [id number] Date of Birth: 05-22-1950 Referring Provider:     Pulmonary Rehab from 07/21/2019 in [facility] Cardiac and Pulmonary Rehab  Referring Provider  Nehemiah Massed      Encounter Date: 09/21/2019  Check In: Session Check In - 09/21/19 1516      Check-In   Supervising physician immediately available to respond to emergencies  See telemetry face sheet for immediately available ER MD    Location  [facility]-Cardiac & Pulmonary Rehab    Staff Present  Renita Papa, RN Moises Blood, BS, ACSM CEP, Exercise Physiologist;Joseph Darrin Nipper, [place], RCEP, CCRP, CCET    Virtual Visit  No    Medication changes reported      No    Fall or balance concerns reported     No    Warm-up and Cool-down  Performed on first and last piece of equipment    Resistance Training Performed  Yes    VAD Patient?  No    PAD/SET Patient?  No      Pain Assessment   Currently in Pain?  No/denies          Social History   Tobacco Use  Smoking Status Never Smoker  Smokeless Tobacco Never Used    Goals Met:  Independence with exercise equipment Exercise tolerated well No report of cardiac concerns or symptoms Strength training completed today  Goals Unmet:  Not Applicable  Comments: Pt able to follow exercise prescription today without complaint.  Will continue to monitor for progression.    Dr. Emily Filbert is Medical Director for [place] and [name].

## 2019-09-23 ENCOUNTER — Other Ambulatory Visit: Payer: Self-pay

## 2019-09-23 ENCOUNTER — Encounter: Payer: Medicare Other | Admitting: *Deleted

## 2019-09-23 DIAGNOSIS — I130 Hypertensive heart and chronic kidney disease with heart failure and stage 1 through stage 4 chronic kidney disease, or unspecified chronic kidney disease: Secondary | ICD-10-CM | POA: Diagnosis not present

## 2019-09-23 DIAGNOSIS — I5022 Chronic systolic (congestive) heart failure: Secondary | ICD-10-CM

## 2019-09-23 NOTE — Progress Notes (Signed)
Daily Session Note  Patient Details  Name: Anna Romero MRN: [id number] Date of Birth: 12-Apr-1951 Referring Provider:     Pulmonary Rehab from 07/21/2019 in [facility] Cardiac and Pulmonary Rehab  Referring Provider  Nehemiah Massed      Encounter Date: 09/23/2019  Check In: Session Check In - 09/23/19 1515      Check-In   Supervising physician immediately available to respond to emergencies  See telemetry face sheet for immediately available ER MD    Location  [facility]-Cardiac & Pulmonary Rehab    Staff Present  Renita Papa, RN BSN;Melissa Caiola RDN, Rowe Pavy, BA, ACSM CEP, Exercise Physiologist    Virtual Visit  No    Medication changes reported      No    Fall or balance concerns reported     No    Warm-up and Cool-down  Performed on first and last piece of equipment    Resistance Training Performed  Yes    VAD Patient?  No    PAD/SET Patient?  No      Pain Assessment   Currently in Pain?  No/denies          Social History   Tobacco Use  Smoking Status Never Smoker  Smokeless Tobacco Never Used    Goals Met:  Independence with exercise equipment Exercise tolerated well No report of cardiac concerns or symptoms Strength training completed today  Goals Unmet:  Not Applicable  Comments: Pt able to follow exercise prescription today without complaint.  Will continue to monitor for progression.    Dr. Emily Filbert is Medical Director for [facility] and [name].

## 2019-09-24 ENCOUNTER — Other Ambulatory Visit: Payer: Self-pay | Admitting: Obstetrics and Gynecology

## 2019-09-24 NOTE — H&P (Signed)
Ms. Cumba is a 69 y.o. female here for Pre-op Exam  Referring provider: Genice Rouge*  History of Present Illness: Patient is an established patient who returns for management of her PMB. .  Bleeding started: Pt called 09/01/2019 with c/o with first time PMB. She was having an actual period with good flow for a few days, no cramping. Initial bleeding lasted for 2.5 days, then stopped, then restarted again the following week lasting also only a few days.  Contributing factors: None   Nature of the bleeding (pattern, quantity, duration, postcoital):  Full period flow initially, lightened up Associated sx (pain, fever, changes in bowel or bladder): None Family hx of breast, colon or endometrial cancer:  Pt adopted and family history is unknown Pap smear hx: last in 10/2018 was normal  TVUS : Ut wnl Ut=8.29 x 4.17 x 5.57 cm Endometrium=25.79 mm Neither ov seen  Pertinent hx: -Hx of Congestive Heart Failure -Hx of vaginal itching, dyspareunia and chronic pelvic pain, saw TJS in 2019, Flagyl & Estradiol cream w/TJS -Clobetasolfor severe vulvar itching, + improvement  -09/2017 TVUS for pelvic pain had no abnormal adnexal masses, 1.6 cm fibroid, Endo stripe = complex & thickened, 80mm -09/2017 EMBx: fragmented atrophic endometrium in copious mucus. No hyperplasia or carcinoma -1 vaginal delivery, breech  Past Medical History:  has a past medical history of Asthma without status asthmaticus, unspecified (03/29/2014), Benign essential hypertension (10/01/2014), Cardiomyopathy (CMS-HCC) (03/29/2014), Cataract cortical, senile, Degenerative disc disease, cervical (03/29/2014), Depression (03/29/2014), History of adenomatous polyp of colon, History of gastritis, Hyperlipidemia, mixed (03/29/2014), Hypomagnesemia (09/08/2014), Monoclonal gammopathy (03/29/2014), Osteoarthritis ([place]) (04/05/2014), Polymyositis (CMS-HCC), Psoriasis, Raynaud's phenomenon, Reflux (03/29/2014), Sleep apnea  (03/29/2014), and Tubular adenoma of colon, unspecified (08/27/2016).  Past Surgical History:  has a past surgical history that includes Replacement total hip w/  resurfacing implants (Right); Thymectomy (in childhood); Breast excisional biopsy [id number]); egd (11/24/1999); colonoscopy (12/08/2012); Appendectomy (07/1993); Cataract extraction (Right, 07/2007); and Colonoscopy (07/22/2018, 08/27/2016). Family History: She was adopted. Family history is unknown by patient. Social History:  reports that she has never smoked. She has never used smokeless tobacco. She reports current alcohol use. She reports that she does not use drugs. OB/GYN History:          OB History    Gravida  3   Para  1   Term  1   Preterm      AB  2   Living  1     SAB      TAB  2   Ectopic      Molar      Multiple      Live Births  1        Allergies: is allergic to hydrochlorothiazide. Medications: Current Outpatient Medications:  .  acetaminophen (TYLENOL) 500 MG tablet, Take 1,000 mg by mouth every 8 (eight) hours as needed for Pain, Disp: , Rfl:  .  albuterol 90 mcg/actuation inhaler, Inhale 2 inhalations into the lungs every 4 (four) hours as needed   , Disp: , Rfl:  .  buPROPion (WELLBUTRIN SR) 200 MG SR tablet, TAKE 1 TABLET BY MOUTH EVERY DAY IN THE MORNING, Disp: , Rfl:  .  carvediloL (COREG) 12.5 MG tablet, TAKE 1 TABLET (12.5 MG TOTAL) BY MOUTH 2 (TWO) TIMES DAILY FOR BLOOD PRESSURE, Disp: 60 tablet, Rfl: 0 .  cholecalciferol (VITAMIN D3) 1,000 unit capsule, Take 1,000 Units by mouth once daily., Disp: , Rfl:  .  clonazePAM (KLONOPIN) 0.5 MG tablet, TAKE 1/2  TO 1 TABLET BY MOUTH TWICE A DAY AS NEEDED FOR ANXIETY, Disp: , Rfl: 0 .  donepeziL (ARICEPT) 10 MG tablet, TAKE 1 TABLET BY MOUTH EVERY DAY, Disp: 90 tablet, Rfl: 3 .  doxepin (SINEQUAN) 25 MG capsule, Take 25 mg by mouth nightly as needed   , Disp: , Rfl:  .  FUROsemide (LASIX) 20 MG tablet, Take 1 tablet (20 mg total) by mouth once  daily (Patient taking differently: Take 20 mg by mouth once daily as needed   ), Disp: 30 tablet, Rfl: 11 .  memantine (NAMENDA) 5 MG tablet, Take 1 tablet (5 mg total) by mouth 2 (two) times daily, Disp: 60 tablet, Rfl: 11 .  multivitamin tablet, Take 1 tablet by mouth once daily., Disp: , Rfl:  .  spironolactone (ALDACTONE) 25 MG tablet, Take 1 tablet (25 mg total) by mouth once daily For blood pressure and fluid, Disp: 90 tablet, Rfl: 3 .  telmisartan (MICARDIS) 40 MG tablet, Take 1 tablet (40 mg total) by mouth once daily For blood pressure and heart, Disp: 90 tablet, Rfl: 3 .  tiZANidine (ZANAFLEX) 4 MG tablet, Take 1 tablet (4 mg total) by mouth 3 (three) times daily as needed (muscle spasm and back pain), Disp: 40 tablet, Rfl: 1 .  ascorbic acid, vitamin C, (VITAMIN C) 500 MG tablet, Take 500 mg by mouth once daily (Patient not taking: Reported on 09/09/2019  ), Disp: , Rfl:    Exam:   BP 126/78   Ht 167.6 cm (5\' 6" )   Wt 87.1 kg (192 lb)   BMI 30.99 kg/m   Constitutional:  General appearance: Well nourished, well developed female in no acute distress.  Neuro/psych:  Normal mood and affect. No gross motor deficits. Cardio: irregu rhythm, reg rate Pulm: CTAB Neck:  Supple, normal appearance.  Respiratory:  Normal respiratory effort, no use of accessory muscles Skin:  No visible rashes or external lesions  Impression:   The encounter diagnosis was PMB (postmenopausal bleeding).  Plan:   1. Thickened Endometrial Stripe - in presence with PMB -  Preoperative visit: D&C hysteroscopy. Consents signed today. Risks of surgery were discussed with the patient including but not limited to: bleeding which may require transfusion; infection which may require antibiotics; injury to uterus or surrounding organs; intrauterine scarring which may impair future fertility; need for additional procedures including laparotomy or laparoscopy; and other postoperative/anesthesia complications.  Written informed consent was obtained.  This is a scheduled same-day surgery. She will have a postop visit in 2 weeks to review operative findings and pathology.  Hx of lower back pain and spasm with degen disc disease. POSITION while awake.  Also has hx of kidney failure  2. Congestive Heart Failure -s/p cardiology consult clearance with Dr. Raliegh Ip, is in heart rehab  Diagnoses and all orders for this visit:  PMB (postmenopausal bleeding)

## 2019-09-28 ENCOUNTER — Encounter: Payer: Medicare Other | Admitting: *Deleted

## 2019-09-28 ENCOUNTER — Other Ambulatory Visit: Payer: Self-pay

## 2019-09-28 DIAGNOSIS — I130 Hypertensive heart and chronic kidney disease with heart failure and stage 1 through stage 4 chronic kidney disease, or unspecified chronic kidney disease: Secondary | ICD-10-CM | POA: Diagnosis not present

## 2019-09-28 DIAGNOSIS — I5022 Chronic systolic (congestive) heart failure: Secondary | ICD-10-CM

## 2019-09-28 NOTE — Progress Notes (Signed)
Daily Session Note  Patient Details  Name: Anna Romero MRN: [id number] Date of Birth: [birth year] Referring Provider:     Pulmonary Rehab from 07/21/2019 in [facility] and Pulmonary Rehab  Referring Provider  Nehemiah Massed      Encounter Date: 09/28/2019  Check In: Session Check In - 09/28/19 [place]      Check-In   Supervising physician immediately available to respond to emergencies  See telemetry face sheet for immediately available ER MD    Location  [facility]-Cardiac & Pulmonary Rehab    Staff Present  Renita Papa, RN Moises Blood, BS, ACSM CEP, Exercise Physiologist;Joseph Darrin Nipper, [place], RCEP, CCRP, CCET    Virtual Visit  No    Medication changes reported      No    Fall or balance concerns reported     No    Warm-up and Cool-down  Performed on first and last piece of equipment    Resistance Training Performed  Yes    VAD Patient?  No    PAD/SET Patient?  No      Pain Assessment   Currently in Pain?  No/denies          Social History   Tobacco Use  Smoking Status Never Smoker  Smokeless Tobacco Never Used    Goals Met:  Independence with exercise equipment Exercise tolerated well No report of cardiac concerns or symptoms Strength training completed today  Goals Unmet:  Not Applicable  Comments: Pt able to follow exercise prescription today without complaint.  Will continue to monitor for progression.    Dr. Emily Filbert is Medical Director for [place] and [name].

## 2019-09-30 ENCOUNTER — Encounter: Payer: Medicare Other | Admitting: *Deleted

## 2019-09-30 ENCOUNTER — Other Ambulatory Visit: Payer: Self-pay

## 2019-09-30 DIAGNOSIS — I5022 Chronic systolic (congestive) heart failure: Secondary | ICD-10-CM

## 2019-09-30 DIAGNOSIS — I130 Hypertensive heart and chronic kidney disease with heart failure and stage 1 through stage 4 chronic kidney disease, or unspecified chronic kidney disease: Secondary | ICD-10-CM | POA: Diagnosis not present

## 2019-09-30 NOTE — Progress Notes (Signed)
Daily Session Note  Patient Details  Name: Anna Romero MRN: [id number] Date of Birth: 11/15/1950 Referring Provider:     Pulmonary Rehab from 07/21/2019 in [facility] Cardiac and Pulmonary Rehab  Referring Provider  Kowalski      Encounter Date: 09/30/2019  Check In: Session Check In - 09/30/19 1512      Check-In   Supervising physician immediately available to respond to emergencies  See telemetry face sheet for immediately available ER MD    Location  [facility]    Staff Present  Meredith Craven, RN BSN;Jessica Hawkins, MA, RCEP, CCRP, CCET;Amanda Sommer, BA, ACSM CEP, Exercise Physiologist;Melissa Caiola RDN, LDN    Virtual Visit  No    Medication changes reported      No    Fall or balance concerns reported     No    Warm-up and Cool-down  Performed on first and last piece of equipment    Resistance Training Performed  Yes    VAD Patient?  No    PAD/SET Patient?  No      Pain Assessment   Currently in Pain?  No/denies          Social History   Tobacco Use  Smoking Status Never Smoker  Smokeless Tobacco Never Used    Goals Met:  Independence with exercise equipment Exercise tolerated well No report of cardiac concerns or symptoms Strength training completed today  Goals Unmet:  Not Applicable  Comments: Pt able to follow exercise prescription today without complaint.  Will continue to monitor for progression.    Dr. Mark Miller is Medical Director for [facility] and [name]. 

## 2019-10-01 ENCOUNTER — Encounter: Payer: Medicare Other | Admitting: *Deleted

## 2019-10-01 ENCOUNTER — Other Ambulatory Visit: Payer: Self-pay

## 2019-10-01 DIAGNOSIS — I130 Hypertensive heart and chronic kidney disease with heart failure and stage 1 through stage 4 chronic kidney disease, or unspecified chronic kidney disease: Secondary | ICD-10-CM | POA: Diagnosis not present

## 2019-10-01 DIAGNOSIS — I5022 Chronic systolic (congestive) heart failure: Secondary | ICD-10-CM

## 2019-10-01 NOTE — Progress Notes (Signed)
Daily Session Note  Patient Details  Name: PASSION LAVIN MRN: [id number] Date of Birth: Nov 07, 1950 Referring Provider:     Pulmonary Rehab from 07/21/2019 in [facility] Cardiac and Pulmonary Rehab  Referring Provider  Nehemiah Massed      Encounter Date: 10/01/2019  Check In: Session Check In - 10/01/19 1521      Check-In   Supervising physician immediately available to respond to emergencies  See telemetry face sheet for immediately available ER MD    Location  [facility]    Staff Present  Renita Papa, RN BSN;Joseph [address] [place], [place], [place], CCRP, CCET    Virtual Visit  No    Medication changes reported      No    Fall or balance concerns reported     No    Warm-up and Cool-down  Performed on first and last piece of equipment    Resistance Training Performed  Yes    VAD Patient?  No    PAD/SET Patient?  No      Pain Assessment   Currently in Pain?  No/denies          Social History   Tobacco Use  Smoking Status Never Smoker  Smokeless Tobacco Never Used    Goals Met:  Independence with exercise equipment Exercise tolerated well No report of cardiac concerns or symptoms Strength training completed today  Goals Unmet:  Not Applicable  Comments: Pt able to follow exercise prescription today without complaint.  Will continue to monitor for progression.    Dr. Emily Filbert is Medical Director for [place] and [name].

## 2019-10-05 ENCOUNTER — Encounter: Payer: Medicare Other | Admitting: *Deleted

## 2019-10-05 ENCOUNTER — Other Ambulatory Visit: Payer: Self-pay

## 2019-10-05 ENCOUNTER — Encounter: Admission: RE | Admit: 2019-10-05 | Payer: Medicare Other | Source: Ambulatory Visit

## 2019-10-05 DIAGNOSIS — I5022 Chronic systolic (congestive) heart failure: Secondary | ICD-10-CM

## 2019-10-05 DIAGNOSIS — I130 Hypertensive heart and chronic kidney disease with heart failure and stage 1 through stage 4 chronic kidney disease, or unspecified chronic kidney disease: Secondary | ICD-10-CM | POA: Diagnosis not present

## 2019-10-05 NOTE — Progress Notes (Signed)
Daily Session Note  Patient Details  Name: Anna Romero MRN: [id number] Date of Birth: August 02, 1950 Referring Provider:     Pulmonary Rehab from 07/21/2019 in [facility] and Pulmonary Rehab  Referring Provider  Nehemiah Massed      Encounter Date: 10/05/2019  Check In: Session Check In - 10/05/19 1514      Check-In   Supervising physician immediately available to respond to emergencies  See telemetry face sheet for immediately available ER MD    Location  [facility]    Staff Present  Renita Papa, RN BSN;Joseph [address] [place], [place], ACSM CEP, Exercise Physiologist    Virtual Visit  No    Medication changes reported      No    Fall or balance concerns reported     No    Warm-up and Cool-down  Performed on first and last piece of equipment    Resistance Training Performed  Yes    VAD Patient?  No    PAD/SET Patient?  No      Pain Assessment   Currently in Pain?  No/denies          Social History   Tobacco Use  Smoking Status Never Smoker  Smokeless Tobacco Never Used    Goals Met:  Independence with exercise equipment Exercise tolerated well No report of cardiac concerns or symptoms Strength training completed today  Goals Unmet:  Not Applicable  Comments: Pt able to follow exercise prescription today without complaint.  Will continue to monitor for progression.    Dr. Emily Filbert is Medical Director for [place] and [name].

## 2019-10-07 ENCOUNTER — Encounter: Payer: Self-pay | Admitting: *Deleted

## 2019-10-07 ENCOUNTER — Encounter
Admission: RE | Admit: 2019-10-07 | Discharge: 2019-10-07 | Disposition: A | Discharge status: Home / Self Care | Payer: Medicare Other | Source: Ambulatory Visit | Attending: Obstetrics and Gynecology | Admitting: Obstetrics and Gynecology

## 2019-10-07 ENCOUNTER — Other Ambulatory Visit: Payer: Self-pay

## 2019-10-07 DIAGNOSIS — Z20822 Contact with and (suspected) exposure to covid-19: Secondary | ICD-10-CM | POA: Diagnosis not present

## 2019-10-07 DIAGNOSIS — I5022 Chronic systolic (congestive) heart failure: Secondary | ICD-10-CM

## 2019-10-07 DIAGNOSIS — Z01812 Encounter for preprocedural laboratory examination: Secondary | ICD-10-CM | POA: Diagnosis present

## 2019-10-07 NOTE — Patient Instructions (Signed)
Your procedure is scheduled on: 10/12/19 Report to [place]. To find out your arrival time please call [phone] between 1PM - 3PM on 10/09/22.  Remember: Instructions that are not followed completely may result in serious medical risk, up to and including death, or upon the discretion of your surgeon and anesthesiologist your surgery may need to be rescheduled.     _X__ 1. Do not eat food after midnight the night before your procedure.                 No gum chewing or hard candies. You may drink clear liquids up to 2 hours                 before you are scheduled to arrive for your surgery- DO not drink clear                 liquids within 2 hours of the start of your surgery.                 Clear Liquids include:  water, apple juice without pulp, clear carbohydrate                 drink such as Clearfast or Gatorade, Black Coffee or Tea (Do not add                 anything to coffee or tea). Diabetics water only  __X__2.  On the morning of surgery brush your teeth with toothpaste and water, you                 may rinse your mouth with mouthwash if you wish.  Do not swallow any              toothpaste of mouthwash.     _X__ 3.  No Alcohol for 24 hours before or after surgery.   _X__ 4.  Do Not Smoke or use e-cigarettes For 24 Hours Prior to Your Surgery.                 Do not use any chewable tobacco products for at least 6 hours prior to                 surgery.  ____  5.  Bring all medications with you on the day of surgery if instructed.   __X__  6.  Notify your doctor if there is any change in your medical condition      (cold, fever, infections).     Do not wear jewelry, make-up, hairpins, clips or nail polish. Do not wear lotions, powders, or perfumes.  Do not shave 48 hours prior to surgery. Men may shave face and neck. Do not bring valuables to the hospital.    [facility] is not responsible for any belongings or  valuables.  Contacts, dentures/partials or body piercings may not be worn into surgery. Bring a case for your contacts, glasses or hearing aids, a denture cup will be supplied. Leave your suitcase in the car. After surgery it may be brought to your room. For patients admitted to the hospital, discharge time is determined by your treatment team.   Patients discharged the day of surgery will not be allowed to drive home.   Please read over the following fact sheets that you were given:   MRSA Information  __X__ Take these medicines the morning of surgery with A SIP OF WATER:  1. buPROPion (WELLBUTRIN SR) 200 MG 12 hr tablet  2. carvedilol (COREG) 12.5 MG tablet  3. memantine (NAMENDA) 5 MG tablet  4. omeprazole (PRILOSEC) 20 MG capsule  5. clonazePAM (KLONOPIN) 0.5 MG tablet IF NEEDED  6.  ____ Fleet Enema (as directed)   ____ Use CHG Soap/SAGE wipes as directed  __X__ Use inhalers on the day of surgery  ____ Stop metformin/Janumet/Farxiga 2 days prior to surgery    ____ Take 1/2 of usual insulin dose the night before surgery. No insulin the morning          of surgery.   ____ Stop Blood Thinners Coumadin/Plavix/Xarelto/Pleta/Pradaxa/Eliquis/Effient/Aspirin  on   Or contact your Surgeon, Cardiologist or Medical Doctor regarding  ability to stop your blood thinners  __X__ Stop Anti-inflammatories 7 days before surgery such as Advil, Ibuprofen, Motrin,  BC or Goodies Powder, Naprosyn, Naproxen, Aleve, Aspirin    __X__ Stop all herbal supplements, fish oil or vitamin E until after surgery.    ____ Bring C-Pap to the hospital.    READ THE INCENTIVE SPIROMETER INSTRUCTIONS.  THE ENSURE PRE SURGERY DRINK SHOULD BE FINISHED 2 HOURS PRIOR TO ARRIVING THE DAY OF SURGERY

## 2019-10-07 NOTE — Progress Notes (Signed)
Pulmonary Individual Treatment Plan  Patient Details  Name: Anna Romero MRN: [id number] Date of Birth: 06-24-1950 Referring Provider:     Pulmonary Rehab from 07/21/2019 in [facility] Cardiac and Pulmonary Rehab  Referring Provider  Nehemiah Massed      Initial Encounter Date:    Pulmonary Rehab from 07/21/2019 in [facility] Cardiac and Pulmonary Rehab  Date  07/21/19      Visit Diagnosis: Chronic systolic CHF (congestive heart failure), NYHA class 2 ([place])  Patient's Home Medications on Admission:  Current Outpatient Medications:  .  acetaminophen (TYLENOL) 500 MG tablet, Take 1,000 mg by mouth every 8 (eight) hours as needed for moderate pain. , Disp: , Rfl:  .  albuterol (VENTOLIN HFA) 108 (90 Base) MCG/ACT inhaler, Inhale 1-2 puffs into the lungs every 6 (six) hours as needed for wheezing or shortness of breath., Disp: 18 g, Rfl: 5 .  buPROPion (WELLBUTRIN SR) 200 MG 12 hr tablet, Take 200 mg by mouth every morning. , Disp: , Rfl:  .  carvedilol (COREG) 12.5 MG tablet, Take 12.5 mg by mouth 2 (two) times daily with a meal., Disp: , Rfl:  .  clonazePAM (KLONOPIN) 0.5 MG tablet, Take 0.5 mg by mouth 2 (two) times daily as needed for anxiety., Disp: , Rfl:  .  donepezil (ARICEPT) 10 MG tablet, Take 10 mg by mouth at bedtime., Disp: , Rfl:  .  doxepin (SINEQUAN) 25 MG capsule, Take 25-50 mg by mouth at bedtime as needed (sleep). , Disp: , Rfl:  .  furosemide (LASIX) 20 MG tablet, Take 20 mg by mouth daily as needed for fluid. , Disp: , Rfl:  .  memantine (NAMENDA) 5 MG tablet, Take 5 mg by mouth 2 (two) times daily., Disp: , Rfl:  .  Multiple Vitamins-Minerals (MULTIVITAMIN WITH MINERALS) tablet, Take 1 tablet by mouth daily., Disp: , Rfl:  .  omeprazole (PRILOSEC) 20 MG capsule, Take 20 mg by mouth daily., Disp: , Rfl:  .  spironolactone (ALDACTONE) 25 MG tablet, Take 25 mg by mouth daily., Disp: , Rfl:  .  telmisartan (MICARDIS) 40 MG tablet, Take 40 mg by mouth daily., Disp: , Rfl:  .  traZODone  (DESYREL) 100 MG tablet, Take 100-200 mg by mouth at bedtime as needed for sleep. , Disp: , Rfl:   Past Medical History: Past Medical History:  Diagnosis Date  . Anxiety   . CHF (congestive heart failure) ([place])   . Chronic kidney disease    Renal Insufficiency Stage 3  . Degenerative disc disease, cervical   . Depression   . GERD (gastroesophageal reflux disease)   . Hyperlipidemia   . Hypertension   . Mixed restrictive and obstructive lung disease ([place])    Predominantly restriction due to chronic volume loss in the left lung.  Mild small airways disease.  . Moderate mitral insufficiency   . Monoclonal gammopathy   . Polymyositis ([place])   . Pulmonary hypertension (HCC)     Tobacco Use: Social History   Tobacco Use  Smoking Status Never Smoker  Smokeless Tobacco Never Used    Labs: Recent Review Flowsheet Data    There is no flowsheet data to display.       Pulmonary Assessment Scores:   UCSD: Self-administered rating of dyspnea associated with activities of daily living (ADLs) 6-point scale (0 = "not at all" to 5 = "maximal or unable to do because of breathlessness")  Scoring Scores range from 0 to 120.  Minimally important difference is 5 units  CAT: CAT can identify the health impairment of COPD patients and is better correlated with disease progression.  CAT has a scoring range of zero to 40. The CAT score is classified into four groups of low (less than 10), medium (10 - 20), high (21-30) and very high (31-40) based on the impact level of disease on health status. A CAT score over 10 suggests significant symptoms.  A worsening CAT score could be explained by an exacerbation, poor medication adherence, poor inhaler technique, or progression of COPD or comorbid conditions.  CAT MCID is 2 points  mMRC: mMRC (Modified Medical Research Council) Dyspnea Scale is used to assess the degree of baseline functional disability in patients of respiratory disease due to  dyspnea. No minimal important difference is established. A decrease in score of 1 point or greater is considered a positive change.   Pulmonary Function Assessment:   Exercise Target Goals: Exercise Program Goal: Individual exercise prescription set using results from initial 6 min walk test and THRR while considering  patient's activity barriers and safety.   Exercise Prescription Goal: Initial exercise prescription builds to 30-45 minutes a day of aerobic activity, 2-3 days per week.  Home exercise guidelines will be given to patient during program as part of exercise prescription that the participant will acknowledge.  Education: Aerobic Exercise & Resistance Training: - Gives group verbal and written instruction on the various components of exercise. Focuses on aerobic and resistive training programs and the benefits of this training and how to safely progress through these programs..   Education: Exercise & Equipment Safety: - Individual verbal instruction and demonstration of equipment use and safety with use of the equipment.   Pulmonary Rehab from 07/21/2019 in [facility] Cardiac and Pulmonary Rehab  Date  07/21/19  Educator  AS  Instruction Review Code  1- Verbalizes Understanding      Education: Exercise Physiology & General Exercise Guidelines: - Group verbal and written instruction with models to review the exercise physiology of the cardiovascular system and associated critical values. Provides general exercise guidelines with specific guidelines to those with heart or lung disease.    Education: Flexibility, Balance, Mind/Body Relaxation: Provides group verbal/written instruction on the benefits of flexibility and balance training, including mind/body exercise modes such as yoga, pilates and tai chi.  Demonstration and skill practice provided.   Activity Barriers & Risk Stratification:   6 Minute Walk:  Oxygen Initial Assessment:   Oxygen Re-Evaluation: Oxygen  Re-Evaluation    Row Name 09/23/19 1534 09/30/19 1534           Program Oxygen Prescription   Program Oxygen Prescription  None  None        Home Oxygen   Home Oxygen Device  None  None      Sleep Oxygen Prescription  None Dr said she didnt have sleep apnea anymore  None      Home Exercise Oxygen Prescription  None  None      Home at Rest Exercise Oxygen Prescription  None  None      Compliance with Home Oxygen Use  -  Yes        Goals/Expected Outcomes   Short Term Goals  To learn and exhibit compliance with exercise, home and travel O2 prescription;To learn and understand importance of monitoring SPO2 with pulse oximeter and demonstrate accurate use of the pulse oximeter.;To learn and understand importance of maintaining oxygen saturations>88%;To learn and demonstrate proper pursed lip breathing techniques or other breathing techniques.;To learn and  demonstrate proper use of respiratory medications  -      Long  Term Goals  -  Exhibits compliance with exercise, home and travel O2 prescription;Verbalizes importance of monitoring SPO2 with pulse oximeter and return demonstration;Maintenance of O2 saturations>88%;Exhibits proper breathing techniques, such as pursed lip breathing or other method taught during program session;Compliance with respiratory medication;Demonstrates proper use of MDI's      Comments  Reviewed PLB technique with pt.  Talked about how it works and it's importance in maintaining their exercise saturations.  -      Goals/Expected Outcomes  Short: Become more profiecient at using PLB.   Long: Become independent at using PLB.  -         Oxygen Discharge (Final Oxygen Re-Evaluation): Oxygen Re-Evaluation - 09/30/19 1534      Program Oxygen Prescription   Program Oxygen Prescription  None      Home Oxygen   Home Oxygen Device  None    Sleep Oxygen Prescription  None    Home Exercise Oxygen Prescription  None    Home at Rest Exercise Oxygen Prescription  None     Compliance with Home Oxygen Use  Yes      Goals/Expected Outcomes   Long  Term Goals  Exhibits compliance with exercise, home and travel O2 prescription;Verbalizes importance of monitoring SPO2 with pulse oximeter and return demonstration;Maintenance of O2 saturations>88%;Exhibits proper breathing techniques, such as pursed lip breathing or other method taught during program session;Compliance with respiratory medication;Demonstrates proper use of MDI's       Initial Exercise Prescription:   Perform Capillary Blood Glucose checks as needed.  Exercise Prescription Changes: Exercise Prescription Changes    Row Name 08/13/19 1300 08/28/19 1300 09/09/19 1200 09/22/19 1500       Response to Exercise   Blood Pressure (Admit)  102/64  128/58  110/64  140/80    Blood Pressure (Exercise)  128/64  122/58  142/60  152/82    Blood Pressure (Exit)  115/60  -  114/58  118/60    Heart Rate (Admit)  60 bpm  68 bpm  60 bpm  64 bpm    Heart Rate (Exercise)  76 bpm  80 bpm  83 bpm  76 bpm    Heart Rate (Exit)  84 bpm  67 bpm  67 bpm  67 bpm    Oxygen Saturation (Admit)  98 %  97 %  95 %  97 %    Oxygen Saturation (Exercise)  92 %  93 %  94 %  96 %    Oxygen Saturation (Exit)  98 %  96 %  97 %  95 %    Rating of Perceived Exertion (Exercise)  -  _0 Perceived Dyspnea (Exercise)  -  1  0  0    Symptoms  -  -  -  none    Duration  Continue with 30 min of aerobic exercise without signs/symptoms of physical distress.  Continue with 30 min of aerobic exercise without signs/symptoms of physical distress.  Continue with 30 min of aerobic exercise without signs/symptoms of physical distress.  Continue with 30 min of aerobic exercise without signs/symptoms of physical distress.    Intensity  THRR unchanged  THRR unchanged  THRR unchanged  THRR unchanged      Progression   Progression  Continue to progress workloads to maintain intensity without signs/symptoms of physical distress.  Continue to  progress workloads to  maintain intensity without signs/symptoms of physical distress.  Continue to progress workloads to maintain intensity without signs/symptoms of physical distress.  Continue to progress workloads to maintain intensity without signs/symptoms of physical distress.    Average METs  2.25  2.53  2.88  2.35      Resistance Training   Training Prescription  Yes  Yes  Yes  Yes    Weight  3 lb  3 lb   3 lb   3 lb    Reps  10-15  10-15  10-15  10-15      Interval Training   Interval Training  No  No  No  No      Treadmill   MPH  2  2.3  2.3  2.3    Grade  0  0  0  0    Minutes  _0 METs  2.53  2.76  2.76  2.76      NuStep   Level  -  -  -  2    Minutes  -  -  -  15    METs  -  -  -  1.3      REL-XR   Level  _1 Speed  50  50  50  -    Minutes  _2 METs  -  2._3 Exercise Comments:   Exercise Goals and Review:   Exercise Goals Re-Evaluation : Exercise Goals Re-Evaluation    Row Name 08/13/19 1305 08/28/19 1329 09/09/19 1204 09/22/19 1530 09/23/19 1536     Exercise Goal Re-Evaluation   Exercise Goals Review  Increase Physical Activity;Increase Strength and Stamina;Able to understand and use rate of perceived exertion (RPE) scale;Able to understand and use Dyspnea scale;Knowledge and understanding of Target Heart Rate Range (THRR);Able to check pulse independently;Understanding of Exercise Prescription  Increase Physical Activity;Increase Strength and Stamina;Able to understand and use rate of perceived exertion (RPE) scale;Able to understand and use Dyspnea scale;Knowledge and understanding of Target Heart Rate Range (THRR);Able to check pulse independently;Understanding of Exercise Prescription  Increase Physical Activity;Increase Strength and Stamina;Able to understand and use rate of perceived exertion (RPE) scale;Able to understand and use Dyspnea scale;Knowledge and understanding of Target Heart Rate Range  (THRR);Able to check pulse independently;Understanding of Exercise Prescription  Increase Physical Activity;Increase Strength and Stamina;Understanding of Exercise Prescription  Increase Physical Activity;Increase Strength and Stamina;Understanding of Exercise Prescription   Comments  Jeanean has attended 3 sessions since orientation.  Consistent attendance will help her achieve better results.  Staff will monitor progress.  Ayushi has increased speed on TM and works at [place] [employer].  Staff will monitor progress.  Kenyada has a hard time attending consistently because she lives a longer drive away.  Staff will review home exercise.  Rozelia continues to struggle with consistent attendance.  She is stress eating over her health but not doing home exericse.  We will continue to encourage improve attendance and monitor her progress.  -   Expected Outcomes  Short: attend consistently Long: improve overall stamina  Short : attend consistently Long: increase MET level  Short add in home exercise whe she cant attend Long: improve stamina  Short: Improved attendance  Long: Continue to improve stamina  -      Discharge Exercise Prescription (Final Exercise  Prescription Changes): Exercise Prescription Changes - 09/22/19 1500      Response to Exercise   Blood Pressure (Admit)  140/80    Blood Pressure (Exercise)  152/82    Blood Pressure (Exit)  118/60    Heart Rate (Admit)  64 bpm    Heart Rate (Exercise)  76 bpm    Heart Rate (Exit)  67 bpm    Oxygen Saturation (Admit)  97 %    Oxygen Saturation (Exercise)  96 %    Oxygen Saturation (Exit)  95 %    Rating of Perceived Exertion (Exercise)  10    Perceived Dyspnea (Exercise)  0    Symptoms  none    Duration  Continue with 30 min of aerobic exercise without signs/symptoms of physical distress.    Intensity  THRR unchanged      Progression   Progression  Continue to progress workloads to maintain intensity without signs/symptoms of physical distress.     Average METs  2.35      Resistance Training   Training Prescription  Yes    Weight   3 lb    Reps  10-15      Interval Training   Interval Training  No      Treadmill   MPH  2.3    Grade  0    Minutes  15    METs  2.76      NuStep   Level  2    Minutes  15    METs  1.3      REL-XR   Level  1    Minutes  15    METs  3       Nutrition:  Target Goals: Understanding of nutrition guidelines, daily intake of sodium <1532m, cholesterol <2045m calories 30% from fat and 7% or less from saturated fats, daily to have 5 or more servings of fruits and vegetables.  Education: Controlling Sodium/Reading Food Labels -Group verbal and written material supporting the discussion of sodium use in heart healthy nutrition. Review and explanation with models, verbal and written materials for utilization of the food label.   Education: General Nutrition Guidelines/Fats and Fiber: -Group instruction provided by verbal, written material, models and posters to present the general guidelines for heart healthy nutrition. Gives an explanation and review of dietary fats and fiber.   Biometrics:    Nutrition Therapy Plan and Nutrition Goals: Nutrition Therapy & Goals - 09/01/19 1434      Nutrition Therapy   Diet  HH, Low Na    Protein (specify units)  65-70g    Fiber  25 grams    Whole Grain Foods  3 servings    Saturated Fats  12 max. grams    Fruits and Vegetables  3 servings/day    Sodium  1.5 grams      Personal Nutrition Goals   Nutrition Goal  ST: mindful eating, increasing stress toolbox for coping LT: Increase fitness, decrease SOB (breathing is ok now, thinks due to less weight).    Comments  Pt reports eating too much and emotional eating. Discussed HH eating and mindful eating. Pt reports not having a working stove. Pt to write down what she has at her disposal and practice mindful eating and working on a stress toolbox. Discussed therapy suggested talking with her doctor  regarding setting something up.      Intervention Plan   Intervention  Prescribe, educate and counsel regarding individualized specific dietary modifications aiming towards targeted  core components such as weight, hypertension, lipid management, diabetes, heart failure and other comorbidities.;Nutrition handout(s) given to patient.    Expected Outcomes  Short Term Goal: Understand basic principles of dietary content, such as calories, fat, sodium, cholesterol and nutrients.;Short Term Goal: A plan has been developed with personal nutrition goals set during dietitian appointment.;Long Term Goal: Adherence to prescribed nutrition plan.       Nutrition Assessments:   MEDIFICTS Score Key:          ?70 Need to make dietary changes          40-70 Heart Healthy Diet         ? 40 Therapeutic Level Cholesterol Diet  Nutrition Goals Re-Evaluation: Nutrition Goals Re-Evaluation    [place] Name 09/21/19 1551             Goals   Nutrition Goal  ST: mindful eating, increasing stress toolbox for coping LT: Increase fitness, decrease SOB (breathing is ok now, thinks due to less weight).       Comment  Pt has been stressed more than usual lately and has been using eating as her only coping mechanism, discussed some coping mechanisms that would help to reduce her stress in addition to eating.Pt would like to start reading and try meditation- used to have some classes at the [facility].       Expected Outcome  ST: mindful eating, increasing stress toolbox for coping LT: Increase fitness, decrease SOB (breathing is ok now, thinks due to less weight).          Nutrition Goals Discharge (Final Nutrition Goals Re-Evaluation): Nutrition Goals Re-Evaluation - 09/21/19 1551      Goals   Nutrition Goal  ST: mindful eating, increasing stress toolbox for coping LT: Increase fitness, decrease SOB (breathing is ok now, thinks due to less weight).    Comment  Pt has been stressed more than usual lately and has been using  eating as her only coping mechanism, discussed some coping mechanisms that would help to reduce her stress in addition to eating.Pt would like to start reading and try meditation- used to have some classes at the [facility].    Expected Outcome  ST: mindful eating, increasing stress toolbox for coping LT: Increase fitness, decrease SOB (breathing is ok now, thinks due to less weight).       Psychosocial: Target Goals: Acknowledge presence or absence of significant depression and/or stress, maximize coping skills, provide positive support system. Participant is able to verbalize types and ability to use techniques and skills needed for reducing stress and depression.   Education: Depression - Provides group verbal and written instruction on the correlation between heart/lung disease and depressed mood, treatment options, and the stigmas associated with seeking treatment.   Education: Sleep Hygiene -Provides group verbal and written instruction about how sleep can affect your health.  Define sleep hygiene, discuss sleep cycles and impact of sleep habits. Review good sleep hygiene tips.    Education: Stress and Anxiety: - Provides group verbal and written instruction about the health risks of elevated stress and causes of high stress.  Discuss the correlation between heart/lung disease and anxiety and treatment options. Review healthy ways to manage with stress and anxiety.   Initial Review & Psychosocial Screening:   Quality of Life Scores:  Scores of 19 and below usually indicate a poorer quality of life in these areas.  A difference of  2-3 points is a clinically meaningful difference.  A difference of 2-3 points  in the total score of the Quality of Life Index has been associated with significant improvement in overall quality of life, self-image, physical symptoms, and general health in studies assessing change in quality of life.  PHQ-9: Recent Review Flowsheet Data    Depression screen [facility]  2/9 09/14/2019 07/21/2019   Decreased Interest 2 1   Down, Depressed, Hopeless 2 1   PHQ - 2 Score 4 2   Altered sleeping 3 1   Tired, decreased energy 1 1   Change in appetite 3 2   Feeling bad or failure about yourself  1 2   Trouble concentrating 0 1   Moving slowly or fidgety/restless 0 0   Suicidal thoughts 0 0   PHQ-9 Score 12 9   Difficult doing work/chores Not difficult at all Somewhat difficult     Interpretation of Total Score  Total Score Depression Severity:  1-4 = Minimal depression, 5-9 = Mild depression, 10-14 = Moderate depression, 15-19 = Moderately severe depression, 20-27 = Severe depression   Psychosocial Evaluation and Intervention:   Psychosocial Re-Evaluation: Psychosocial Re-Evaluation    [place] Name 09/03/19 1515 09/21/19 1553           Psychosocial Re-Evaluation   Current issues with  Current Sleep Concerns;Current Stress Concerns  Current Sleep Concerns;Current Stress Concerns;Current Depression;Current Psychotropic Meds      Comments  Mohini doesnt sleep well at night - and gets very tired during the day.  She is having a sleep study done.  She also lives in [place] and it is a long drive.  Quinnie doesnt sleep well at night - and gets very tired during the day.  She is having a sleep study done.  She also lives in [place] and it is a long drive. She is still on depression medication and has been for a long time. Pt is stressed about her health and would like to lose weight, but she is still gaianing weight due to stress eating.      Expected Outcomes  Short : staff will get her a gas card Long: develop better sleep patterns  Short : try some coping mechanisms we discussed Long: develop better sleep patterns      Interventions  Encouraged to attend Pulmonary Rehabilitation for the exercise  Encouraged to attend Pulmonary Rehabilitation for the exercise      Continue Psychosocial Services   -  Follow up required by staff        Initial Review    Source of Stress Concerns  -  Chronic Illness;Family;Transportation         Psychosocial Discharge (Final Psychosocial Re-Evaluation): Psychosocial Re-Evaluation - 09/21/19 1553      Psychosocial Re-Evaluation   Current issues with  Current Sleep Concerns;Current Stress Concerns;Current Depression;Current Psychotropic Meds    Comments  Niyanna doesnt sleep well at night - and gets very tired during the day.  She is having a sleep study done.  She also lives in [place] and it is a long drive. She is still on depression medication and has been for a long time. Pt is stressed about her health and would like to lose weight, but she is still gaianing weight due to stress eating.    Expected Outcomes  Short : try some coping mechanisms we discussed Long: develop better sleep patterns    Interventions  Encouraged to attend Pulmonary Rehabilitation for the exercise    Continue Psychosocial Services   Follow up required by staff  Initial Review   Source of Stress Concerns  Chronic Illness;Family;Transportation       Education: Education Goals: Education classes will be provided on a weekly basis, covering required topics. Participant will state understanding/return demonstration of topics presented.  Learning Barriers/Preferences:   General Pulmonary Education Topics:  Infection Prevention: - Provides verbal and written material to individual with discussion of infection control including proper hand washing and proper equipment cleaning during exercise session.   Pulmonary Rehab from 07/21/2019 in [facility] Cardiac and Pulmonary Rehab  Date  07/21/19  Educator  AS  Instruction Review Code  1- Verbalizes Understanding      Falls Prevention: - Provides verbal and written material to individual with discussion of falls prevention and safety.   Pulmonary Rehab from 07/21/2019 in [facility] Cardiac and Pulmonary Rehab  Date  07/21/19  Educator  AS  Instruction Review Code  1- Verbalizes  Understanding      Chronic Lung Diseases: - Group verbal and written instruction to review updates, respiratory medications, advancements in procedures and treatments. Discuss use of supplemental oxygen including available portable oxygen systems, continuous and intermittent flow rates, concentrators, personal use and safety guidelines. Review proper use of inhaler and spacers. Provide informative websites for self-education.    Energy Conservation: - Provide group verbal and written instruction for methods to conserve energy, plan and organize activities. Instruct on pacing techniques, use of adaptive equipment and posture/positioning to relieve shortness of breath.   Triggers and Exacerbations: - Group verbal and written instruction to review types of environmental triggers and ways to prevent exacerbations. Discuss weather changes, air quality and the benefits of nasal washing. Review warning signs and symptoms to help prevent infections. Discuss techniques for effective airway clearance, coughing, and vibrations.   AED/CPR: - Group verbal and written instruction with the use of models to demonstrate the basic use of the AED with the basic ABC's of resuscitation.   Anatomy and Physiology of the Lungs: - Group verbal and written instruction with the use of models to provide basic lung anatomy and physiology related to function, structure and complications of lung disease.   Anatomy & Physiology of the Heart: - Group verbal and written instruction and models provide basic cardiac anatomy and physiology, with the coronary electrical and arterial systems. Review of Valvular disease and Heart Failure   Cardiac Medications: - Group verbal and written instruction to review commonly prescribed medications for heart disease. Reviews the medication, class of the drug, and side effects.   Other: -Provides group and verbal instruction on various topics (see comments)   Knowledge  Questionnaire Score:    Core Components/Risk Factors/Patient Goals at Admission:   Education:Diabetes - Individual verbal and written instruction to review signs/symptoms of diabetes, desired ranges of glucose level fasting, after meals and with exercise. Acknowledge that pre and post exercise glucose checks will be done for 3 sessions at entry of program.   Education: Know Your Numbers and Risk Factors: -Group verbal and written instruction about important numbers in your health.  Discussion of what are risk factors and how they play a role in the disease process.  Review of Cholesterol, Blood Pressure, Diabetes, and BMI and the role they play in your overall health.   Core Components/Risk Factors/Patient Goals Review:  Goals and Risk Factor Review    Row Name 09/03/19 1519 09/21/19 1557           Core Components/Risk Factors/Patient Goals Review   Personal Goals Review  Weight Management/Obesity;Heart Failure;Improve shortness of  breath with ADL's;Develop more efficient breathing techniques such as purse lipped breathing and diaphragmatic breathing and practicing self-pacing with activity.  Weight Management/Obesity;Heart Failure;Improve shortness of breath with ADL's;Develop more efficient breathing techniques such as purse lipped breathing and diaphragmatic breathing and practicing self-pacing with activity.;Stress      Review  Henri can tell she can get around better since starting to exercise.  She is concerned beacuse she doesnt sleep well and gets tired during the day - sometimes too sleepy to drive.  She has spoekn with the HF clinic about monitoring weight.  Pt has been stressed more than usual lately and has been using eating as her only coping mechanism, discussed some coping mechanisms that would help to reduce her stress in addition to eating.Pt would like to start reading and try meditation- used to have some classes at the [facility]      Expected Outcomes  Short:  weight  consistently to monitor HF symptoms Long:  manage risk factors  Short:  weight consistently to monitor HF symptoms, use coping mechanisms besides eating Long:  manage risk factors         Core Components/Risk Factors/Patient Goals at Discharge (Final Review):  Goals and Risk Factor Review - 09/21/19 1557      Core Components/Risk Factors/Patient Goals Review   Personal Goals Review  Weight Management/Obesity;Heart Failure;Improve shortness of breath with ADL's;Develop more efficient breathing techniques such as purse lipped breathing and diaphragmatic breathing and practicing self-pacing with activity.;Stress    Review  Pt has been stressed more than usual lately and has been using eating as her only coping mechanism, discussed some coping mechanisms that would help to reduce her stress in addition to eating.Pt would like to start reading and try meditation- used to have some classes at the [facility].    Expected Outcomes  Short:  weight consistently to monitor HF symptoms, use coping mechanisms besides eating Long:  manage risk factors       ITP Comments: ITP Comments    Row Name 08/12/19 1010 09/01/19 1431 09/07/19 1510 09/09/19 1034 10/07/19 0640   ITP Comments  30 day chart review completed. ITP sent to Dr Zachery Dakins Medical Director, for review,changes as needed and signature.  Completed Initial RD Eval  pt called out, she threw out her back and has a MD appt on Wed.  30 day chart review completed. ITP sent to Dr Zachery Dakins Medical Director, for review,changes as needed and signature. Continue with ITP if no changes requested  30 Day review completed. ITP review done, changes made as directed,and approval shown by signature of  Scientist, research (life sciences).      Comments:

## 2019-10-08 ENCOUNTER — Encounter
Admission: RE | Admit: 2019-10-08 | Discharge: 2019-10-08 | Disposition: A | Discharge status: Home / Self Care | Payer: Medicare Other | Source: Ambulatory Visit | Attending: Obstetrics and Gynecology | Admitting: Obstetrics and Gynecology

## 2019-10-08 ENCOUNTER — Encounter: Payer: Medicare Other | Admitting: *Deleted

## 2019-10-08 DIAGNOSIS — I130 Hypertensive heart and chronic kidney disease with heart failure and stage 1 through stage 4 chronic kidney disease, or unspecified chronic kidney disease: Secondary | ICD-10-CM | POA: Diagnosis not present

## 2019-10-08 DIAGNOSIS — I5022 Chronic systolic (congestive) heart failure: Secondary | ICD-10-CM

## 2019-10-08 DIAGNOSIS — Z01812 Encounter for preprocedural laboratory examination: Secondary | ICD-10-CM | POA: Diagnosis not present

## 2019-10-08 LAB — CBC
HCT: 40 % (ref 36.0–46.0)
Hemoglobin: 13.3 g/dL (ref 12.0–15.0)
MCH: 32.5 pg (ref 26.0–34.0)
MCHC: 33.3 g/dL (ref 30.0–36.0)
MCV: 97.8 fL (ref 80.0–100.0)
Platelets: 184 10*3/uL (ref 150–400)
RBC: 4.09 MIL/uL (ref 3.87–5.11)
RDW: 12.1 % (ref 11.5–15.5)
WBC: 7.4 10*3/uL (ref 4.0–10.5)
nRBC: 0 % (ref 0.0–0.2)

## 2019-10-08 LAB — BASIC METABOLIC PANEL
Anion gap: 10 (ref 5–15)
BUN: 23 mg/dL (ref 8–23)
CO2: 25 mmol/L (ref 22–32)
Calcium: 9.4 mg/dL (ref 8.9–10.3)
Chloride: 104 mmol/L (ref 98–111)
Creatinine, Ser: 1.04 mg/dL — ABNORMAL HIGH (ref 0.44–1.00)
GFR calc Af Amer: >60 mL/min (ref >60)
GFR calc non Af Amer: 55 mL/min — ABNORMAL LOW (ref >60)
Glucose, Bld: 100 mg/dL — ABNORMAL HIGH (ref 70–99)
Potassium: 4.1 mmol/L (ref 3.5–5.1)
Sodium: 139 mmol/L (ref 135–145)

## 2019-10-08 LAB — TYPE AND SCREEN
ABO/RH(D): O NEG
Antibody Screen: NEGATIVE

## 2019-10-08 NOTE — Progress Notes (Signed)
Daily Session Note  Patient Details  Name: Anna Romero MRN: [id number] Date of Birth: 01-09-1951 Referring Provider:     Pulmonary Rehab from 07/21/2019 in [facility] Cardiac and Pulmonary Rehab  Referring Provider  Nehemiah Massed      Encounter Date: 10/08/2019  Check In: Session Check In - 10/08/19 1504      Check-In   Supervising physician immediately available to respond to emergencies  See telemetry face sheet for immediately available ER MD    Location  [facility]-Cardiac & Pulmonary Rehab    Staff Present  Renita Papa, RN BSN;Joseph [address] [place], [place], [place], CCRP, CCET    Virtual Visit  No    Medication changes reported      No    Fall or balance concerns reported     No    Warm-up and Cool-down  Performed on first and last piece of equipment    Resistance Training Performed  Yes    VAD Patient?  No    PAD/SET Patient?  No      Pain Assessment   Currently in Pain?  No/denies          Social History   Tobacco Use  Smoking Status Never Smoker  Smokeless Tobacco Never Used    Goals Met:  Independence with exercise equipment Exercise tolerated well No report of cardiac concerns or symptoms Strength training completed today  Goals Unmet:  Not Applicable  Comments: Pt able to follow exercise prescription today without complaint.  Will continue to monitor for progression.    Dr. Emily Filbert is Medical Director for [facility] and [name].

## 2019-10-09 LAB — SARS CORONAVIRUS 2 (TAT 6-24 HRS): SARS Coronavirus 2: NEGATIVE

## 2019-10-12 ENCOUNTER — Other Ambulatory Visit: Payer: Self-pay

## 2019-10-12 ENCOUNTER — Ambulatory Visit: Payer: Medicare Other | Admitting: Anesthesiology

## 2019-10-12 ENCOUNTER — Ambulatory Visit
Admission: RE | Admit: 2019-10-12 | Discharge: 2019-10-12 | Disposition: A | Discharge status: Home / Self Care | Payer: Medicare Other | Source: Ambulatory Visit | Attending: Obstetrics and Gynecology | Admitting: Obstetrics and Gynecology

## 2019-10-12 ENCOUNTER — Encounter: Payer: Self-pay | Admitting: Obstetrics and Gynecology

## 2019-10-12 ENCOUNTER — Encounter
Admission: RE | Disposition: A | Discharge status: Home / Self Care | Payer: Self-pay | Source: Ambulatory Visit | Attending: Obstetrics and Gynecology

## 2019-10-12 DIAGNOSIS — I272 Pulmonary hypertension, unspecified: Secondary | ICD-10-CM | POA: Insufficient documentation

## 2019-10-12 DIAGNOSIS — G4730 Sleep apnea, unspecified: Secondary | ICD-10-CM | POA: Diagnosis not present

## 2019-10-12 DIAGNOSIS — N840 Polyp of corpus uteri: Secondary | ICD-10-CM | POA: Insufficient documentation

## 2019-10-12 DIAGNOSIS — Z79899 Other long term (current) drug therapy: Secondary | ICD-10-CM | POA: Insufficient documentation

## 2019-10-12 DIAGNOSIS — Z96649 Presence of unspecified artificial hip joint: Secondary | ICD-10-CM | POA: Insufficient documentation

## 2019-10-12 DIAGNOSIS — N950 Postmenopausal bleeding: Secondary | ICD-10-CM | POA: Diagnosis present

## 2019-10-12 DIAGNOSIS — J45909 Unspecified asthma, uncomplicated: Secondary | ICD-10-CM | POA: Insufficient documentation

## 2019-10-12 DIAGNOSIS — N189 Chronic kidney disease, unspecified: Secondary | ICD-10-CM | POA: Diagnosis not present

## 2019-10-12 DIAGNOSIS — Z888 Allergy status to other drugs, medicaments and biological substances status: Secondary | ICD-10-CM | POA: Insufficient documentation

## 2019-10-12 DIAGNOSIS — J449 Chronic obstructive pulmonary disease, unspecified: Secondary | ICD-10-CM | POA: Insufficient documentation

## 2019-10-12 DIAGNOSIS — F329 Major depressive disorder, single episode, unspecified: Secondary | ICD-10-CM | POA: Diagnosis not present

## 2019-10-12 DIAGNOSIS — I509 Heart failure, unspecified: Secondary | ICD-10-CM | POA: Diagnosis not present

## 2019-10-12 DIAGNOSIS — C541 Malignant neoplasm of endometrium: Secondary | ICD-10-CM | POA: Diagnosis not present

## 2019-10-12 DIAGNOSIS — M1990 Unspecified osteoarthritis, unspecified site: Secondary | ICD-10-CM | POA: Insufficient documentation

## 2019-10-12 DIAGNOSIS — I130 Hypertensive heart and chronic kidney disease with heart failure and stage 1 through stage 4 chronic kidney disease, or unspecified chronic kidney disease: Secondary | ICD-10-CM | POA: Insufficient documentation

## 2019-10-12 DIAGNOSIS — F419 Anxiety disorder, unspecified: Secondary | ICD-10-CM | POA: Diagnosis not present

## 2019-10-12 HISTORY — PX: HYSTEROSCOPY WITH D & C: SHX1775

## 2019-10-12 LAB — ABO/RH: ABO/RH(D): O NEG

## 2019-10-12 SURGERY — DILATATION AND CURETTAGE /HYSTEROSCOPY
Anesthesia: General

## 2019-10-12 MED ORDER — PHENYLEPHRINE HCL (PRESSORS) 10 MG/ML IV SOLN
INTRAVENOUS | Status: DC | PRN
  Administered 2019-10-12: 100 ug via INTRAVENOUS

## 2019-10-12 MED ORDER — MIDAZOLAM HCL 2 MG/2ML IJ SOLN
INTRAMUSCULAR | Status: DC | PRN
  Administered 2019-10-12: 2 mg via INTRAVENOUS

## 2019-10-12 MED ORDER — FENTANYL CITRATE (PF) 100 MCG/2ML IJ SOLN
25.0000 ug | INTRAMUSCULAR | Status: DC | PRN
  Administered 2019-10-12 (×3): 25 ug via INTRAVENOUS

## 2019-10-12 MED ORDER — FENTANYL CITRATE (PF) 100 MCG/2ML IJ SOLN
INTRAMUSCULAR | Status: DC | PRN
  Administered 2019-10-12: 25 ug via INTRAVENOUS
  Administered 2019-10-12: 75 ug via INTRAVENOUS

## 2019-10-12 MED ORDER — DEXAMETHASONE SODIUM PHOSPHATE 10 MG/ML IJ SOLN
INTRAMUSCULAR | Status: AC
  Filled 2019-10-12: qty 1

## 2019-10-12 MED ORDER — ONDANSETRON HCL 4 MG/2ML IJ SOLN
INTRAMUSCULAR | Status: DC | PRN
  Administered 2019-10-12: 4 mg via INTRAVENOUS

## 2019-10-12 MED ORDER — FENTANYL CITRATE (PF) 100 MCG/2ML IJ SOLN
INTRAMUSCULAR | Status: AC
  Filled 2019-10-12: qty 2

## 2019-10-12 MED ORDER — DEXAMETHASONE SODIUM PHOSPHATE 10 MG/ML IJ SOLN
INTRAMUSCULAR | Status: DC | PRN
  Administered 2019-10-12: 10 mg via INTRAVENOUS

## 2019-10-12 MED ORDER — OXYCODONE HCL 5 MG/5ML PO SOLN: 5.0000 mg | Freq: Once | ORAL | Status: DC | PRN

## 2019-10-12 MED ORDER — LIDOCAINE HCL (CARDIAC) PF 100 MG/5ML IV SOSY
PREFILLED_SYRINGE | INTRAVENOUS | Status: DC | PRN
  Administered 2019-10-12: 80 mg via INTRAVENOUS

## 2019-10-12 MED ORDER — OXYCODONE HCL 5 MG PO TABS: 5.0000 mg | ORAL_TABLET | Freq: Once | ORAL | Status: DC | PRN

## 2019-10-12 MED ORDER — ONDANSETRON HCL 4 MG/2ML IJ SOLN
INTRAMUSCULAR | Status: AC
  Filled 2019-10-12: qty 2

## 2019-10-12 MED ORDER — SILVER NITRATE-POT NITRATE 75-25 % EX MISC
CUTANEOUS | Status: AC
  Filled 2019-10-12: qty 10

## 2019-10-12 MED ORDER — LIDOCAINE HCL (PF) 2 % IJ SOLN
INTRAMUSCULAR | Status: AC
  Filled 2019-10-12: qty 5

## 2019-10-12 MED ORDER — PROPOFOL 10 MG/ML IV BOLUS
INTRAVENOUS | Status: DC | PRN
  Administered 2019-10-12: 40 mg via INTRAVENOUS
  Administered 2019-10-12: 150 mg via INTRAVENOUS

## 2019-10-12 MED ORDER — EPHEDRINE 5 MG/ML INJ
INTRAVENOUS | Status: AC
  Filled 2019-10-12: qty 10

## 2019-10-12 MED ORDER — PROPOFOL 10 MG/ML IV BOLUS
INTRAVENOUS | Status: AC
  Filled 2019-10-12: qty 20

## 2019-10-12 MED ORDER — FENTANYL CITRATE (PF) 100 MCG/2ML IJ SOLN
INTRAMUSCULAR | Status: AC
  Administered 2019-10-12: 25 ug via INTRAVENOUS
  Filled 2019-10-12: qty 2

## 2019-10-12 MED ORDER — MIDAZOLAM HCL 2 MG/2ML IJ SOLN
INTRAMUSCULAR | Status: AC
  Filled 2019-10-12: qty 2

## 2019-10-12 MED ORDER — LACTATED RINGERS IV SOLN: INTRAVENOUS | Status: DC

## 2019-10-12 MED ORDER — EPHEDRINE SULFATE 50 MG/ML IJ SOLN
INTRAMUSCULAR | Status: DC | PRN
  Administered 2019-10-12 (×4): 15 mg via INTRAVENOUS

## 2019-10-12 MED ORDER — ONDANSETRON HCL 4 MG/2ML IJ SOLN: 4.0000 mg | Freq: Once | INTRAMUSCULAR | Status: DC | PRN

## 2019-10-12 MED ORDER — ROCURONIUM BROMIDE 10 MG/ML (PF) SYRINGE
PREFILLED_SYRINGE | INTRAVENOUS | Status: AC
  Filled 2019-10-12: qty 10

## 2019-10-12 SURGICAL SUPPLY — 22 items
BAG INFUSER PRESSURE 100CC (MISCELLANEOUS) ×3 IMPLANT
CANISTER SUCT 3000ML PPV (MISCELLANEOUS) ×3 IMPLANT
CATH ROBINSON RED A/P 16FR (CATHETERS) ×3 IMPLANT
COVER WAND RF STERILE (DRAPES) ×3 IMPLANT
DEVICE MYOSURE REACH (MISCELLANEOUS) ×2 IMPLANT
ELECT REM PT RETURN 9FT ADLT (ELECTROSURGICAL) ×3
ELECTRODE REM PT RTRN 9FT ADLT (ELECTROSURGICAL) ×1 IMPLANT
GLOVE BIO SURGEON STRL SZ7 (GLOVE) ×3 IMPLANT
GLOVE INDICATOR 7.5 STRL GRN (GLOVE) ×3 IMPLANT
GOWN SRG 2XL LVL 4 RGLN SLV (GOWNS) IMPLANT
GOWN STRL NON-REIN 2XL LVL4 (GOWNS) ×2
GOWN STRL REUS W/ TWL LRG LVL3 (GOWN DISPOSABLE) ×2 IMPLANT
GOWN STRL REUS W/TWL LRG LVL3 (GOWN DISPOSABLE) ×14
KIT PROCEDURE FLUENT (KITS) ×3 IMPLANT
KIT TURNOVER CYSTO (KITS) ×3 IMPLANT
PACK DNC HYST (MISCELLANEOUS) ×3 IMPLANT
PAD OB MATERNITY 4.3X12.25 (PERSONAL CARE ITEMS) ×3 IMPLANT
PAD PREP 24X41 OB/GYN DISP (PERSONAL CARE ITEMS) ×3 IMPLANT
SOL .9 NS 3000ML IRR  AL (IV SOLUTION) ×2
SOL .9 NS 3000ML IRR UROMATIC (IV SOLUTION) ×1 IMPLANT
TUBING CONNECTING 10 (TUBING) ×2 IMPLANT
TUBING CONNECTING 10' (TUBING) ×1

## 2019-10-12 NOTE — Transfer of Care (Signed)
Immediate Anesthesia Transfer of Care Note  Patient: Anna Romero  Procedure(s) Performed: DILATATION AND CURETTAGE /HYSTEROSCOPY, POLYPECTOMY OR MYOMECTOMY (N/A )  Patient Location: PACU  Anesthesia Type:General  Level of Consciousness: awake, alert  and oriented  Airway & Oxygen Therapy: Patient Spontanous Breathing and Patient connected to face mask oxygen  Post-op Assessment: Report given to RN and Post -op Vital signs reviewed and stable  Post vital signs: Reviewed and stable  Last Vitals:  Vitals Value Taken Time  BP 129/64 10/12/19 1154  Temp    Pulse 50 10/12/19 1157  Resp 9 10/12/19 1157  SpO2 100 % 10/12/19 1157  Vitals shown include unvalidated device data.  Last Pain:  Vitals:   10/12/19 0908  TempSrc: Tympanic  PainSc: 0-No pain         Complications: No apparent anesthesia complications

## 2019-10-12 NOTE — Anesthesia Procedure Notes (Signed)
Procedure Name: LMA Insertion Date/Time: 10/12/2019 10:52 AM Performed by: Doreen Salvage, CRNA Pre-anesthesia Checklist: Patient identified, Patient being monitored, Timeout performed, Emergency Drugs available and Suction available Patient Re-evaluated:Patient Re-evaluated prior to induction Oxygen Delivery Method: Circle system utilized Preoxygenation: Pre-oxygenation with 100% oxygen Induction Type: IV induction Ventilation: Mask ventilation without difficulty LMA: LMA inserted LMA Size: 3.0 Tube type: Oral Number of attempts: 1 Placement Confirmation: positive ETCO2 and breath sounds checked- equal and bilateral Tube secured with: Tape Dental Injury: Teeth and Oropharynx as per pre-operative assessment

## 2019-10-12 NOTE — Anesthesia Preprocedure Evaluation (Addendum)
Anesthesia Evaluation  Patient identified by MRN, date of birth, ID band Patient awake    Reviewed: Allergy & Precautions, H&P , NPO status , Patient's Chart, lab work & pertinent test results  History of Anesthesia Complications Negative for: history of anesthetic complications  Airway Mallampati: II  TM Distance: >3 FB     Dental  (+) Teeth Intact   Pulmonary asthma , COPD,  Pulmonary HTN No home oxygen   breath sounds clear to auscultation       Cardiovascular Exercise Tolerance: Poor hypertension, (-) angina+CHF  (-) Past MI and (-) Cardiac Stents (-) dysrhythmias  Rhythm:regular Rate:Normal  Negative stress echo Feb 2021 Echo:  MILD LV SYSTOLIC DYSFUNCTION WITH MILD LVH NORMAL RIGHT VENTRICULAR SYSTOLIC FUNCTION MILD VALVULAR REGURGITATION  NO VALVULAR STENOSIS MILD MR, TR ,PR EF40-45%   Neuro/Psych PSYCHIATRIC DISORDERS Anxiety Depression negative neurological ROS     GI/Hepatic Neg liver ROS, GERD  ,  Endo/Other  negative endocrine ROS  Renal/GU Renal disease ([facility])     Musculoskeletal   Abdominal   Peds  Hematology negative hematology ROS (+)   Anesthesia Other Findings Past Medical History: No date: Anxiety No date: CHF (congestive heart failure) (HCC) No date: Chronic kidney disease     Comment:  Renal Insufficiency Stage 3 No date: Degenerative disc disease, cervical No date: Depression No date: GERD (gastroesophageal reflux disease) No date: Hyperlipidemia No date: Hypertension No date: Mixed restrictive and obstructive lung disease (HCC)     Comment:  Predominantly restriction due to chronic volume loss in               the left lung.  Mild small airways disease. No date: Moderate mitral insufficiency No date: Monoclonal gammopathy No date: Polymyositis (HCC) No date: Pulmonary hypertension ([place])  Past Surgical History: No date: APPENDECTOMY 1990: BREAST EXCISIONAL BIOPSY; Left  Comment:  negative No date: CATARACT EXTRACTION 08/27/2016: COLONOSCOPY; N/A     Comment:  Procedure: COLONOSCOPY;  Surgeon: Lollie Sails, MD;              Location[facility] END[facility]COPY;  Service: Endoscopy;                Laterality: N/A; 07/22/2018: COLONOSCOPY WITH PROPOFOL; N/A     Comment:  Procedure: COLONOSCOPY WITH PROPOFOL;  Surgeon:               Lollie Sails, MD;  Location: [facility] ENDOSCOPY;                Service: Endoscopy;  Laterality: N/A; No date: JOINT REPLACEMENT No date: REPLACEMENT TOTAL HIP W/  RESURFACING IMPLANTS; Right No date: THYMECTOMY     Reproductive/Obstetrics negative OB ROS                          Anesthesia Physical Anesthesia Plan  ASA: III  Anesthesia Plan: General LMA   Post-op Pain Management:    Induction:   PONV Risk Score and Plan: Ondansetron, Dexamethasone and Treatment may vary due to age or medical condition  Airway Management Planned:   Additional Equipment:   Intra-op Plan:   Post-operative Plan:   Informed Consent: I have reviewed the patients History and Physical, chart, labs and discussed the procedure including the risks, benefits and alternatives for the proposed anesthesia with the patient or authorized representative who has indicated his/her understanding and acceptance.     Dental Advisory Given  Plan Discussed with: Anesthesiologist, CRNA and Surgeon  Anesthesia Plan Comments:        Anesthesia Quick Evaluation

## 2019-10-12 NOTE — Op Note (Signed)
Operative Report Hysteroscopy with Dilation and Curettage   Indications: postmenopausal bleeding   Pre-operative Diagnosis: endometrial polyp   Post-operative Diagnosis: same.  Procedure: 1. Exam under anesthesia 2. Fractional D&C 3. Hysteroscopy 4. Myosure polypectomy  Surgeon: Benjaman Kindler, MD  Assistant(s):  None  Anesthesia: General LMA anesthesia  Anesthesiologist: Tera Mater, MD Anesthesiologist: Tera Mater, MD CRNA: Doreen Salvage, CRNA; Rosebud Poles C, CRNA  Estimated Blood Loss:  Minimal         Intraoperative medications: none         Total IV Fluids: 848ml  Urine Output: 76ml  Total Fluid Deficit:  400 mL          Specimens: Endocervical curettings, endometrial curettings         Complications:  None; patient tolerated the procedure well.         Disposition: PACU - hemodynamically stable.         Condition: stable  Findings: Uterus measuring 7 cm by sound; normal cervix, vagina, perineum. Atrophic endometrium with 3 large masses. 2 appeared to be polyps and one was irregular and calcified. This one did not get removed entirely by the [facility] but a gritty texture was noted after curreting.   Indication for procedure/Consents: 69 y.o. F here for scheduled surgery for the aforementioned diagnoses.  Risks of surgery were discussed with the patient including but not limited to: bleeding which may require transfusion; infection which may require antibiotics; injury to uterus or surrounding organs; intrauterine scarring which may impair future fertility; need for additional procedures including laparotomy or laparoscopy; and other postoperative/anesthesia complications. Written informed consent was obtained.    Procedure Details:  D&C/ Myosure  The patient was taken to the operating room where anesthesia was administered and was found to be adequate. After a formal and adequate timeout was performed, she was placed in the dorsal lithotomy  position and examined with the above findings. She was then prepped and draped in the sterile manner. Her bladder was catheterized for an estimated amount of clear, yellow urine. A weighed speculum was then placed in the patient's vagina and a single tooth tenaculum was applied to the anterior lip of the cervix.  Her cervix was serially dilated to 15 [place] using Hanks dilators. An ECC was performed. The hysteroscope was introduced under direct observation  Using lactated ringers as a distention medium to reveal the above findings. The uterine cavity was carefully examined, the right ostia was recognized but the left was hidden behind the polyp, and atrophic endometrium with the masses above were noted.   This was resected using the Myosure device.  After further careful visualization of the uterine cavity which was still difficult to see due to the bleeding, the hysteroscope was removed under direct visualization.  A sharp curettage was then performed until there was a gritty texture in all four quadrants. The tenaculum was removed from the anterior lip of the cervix and the vaginal speculum was removed after applying silver nitrate for good hemostasis.   The patient tolerated the procedure well and was taken to the recovery area awake and in stable condition. She received iv acetaminophen and Toradol prior to leaving the OR.  The patient will be discharged to home as per PACU criteria. Routine postoperative instructions given. She was prescribed Ibuprofen and Colace. She will follow up in the clinic in two weeks for postoperative evaluation.

## 2019-10-12 NOTE — Interval H&P Note (Signed)
History and Physical Interval Note:  10/12/2019 10:34 AM  Anna Romero  has presented today for surgery, with the diagnosis of post menopausal bleeding.  The various methods of treatment have been discussed with the patient and family. After consideration of risks, benefits and other options for treatment, the patient has consented to  Procedure(s): DILATATION AND CURETTAGE /HYSTEROSCOPY, POSSIBLE POLYPECTOMY OR MYOMECTOMY (N/A) as a surgical intervention.  The patient's history has been reviewed, patient examined, no change in status, stable for surgery.  I have reviewed the patient's chart and labs.  Questions were answered to the patient's satisfaction.     Benjaman Kindler

## 2019-10-12 NOTE — Discharge Instructions (Signed)
AMBULATORY SURGERY  DISCHARGE INSTRUCTIONS   1) The drugs that you were given will stay in your system until tomorrow so for the next 24 hours you should not:  A) Drive an automobile B) Make any legal decisions C) Drink any alcoholic beverage   2) You may resume regular meals tomorrow.  Today it is better to start with liquids and gradually work up to solid foods.  You may eat anything you prefer, but it is better to start with liquids, then soup and crackers, and gradually work up to solid foods.   3) Please notify your doctor immediately if you have any unusual bleeding, trouble breathing, redness and pain at the surgery site, drainage, fever, or pain not relieved by medication. 4)   5) Your post-operative visit with Dr.                                     is: Date:                        Time:    Please call to schedule your post-operative visit.  6) Additional Instructions:     Discharge instructions after a hysteroscopy with dilation and curettage  Signs and Symptoms to Report  Call our office at [phone] if you have any of the following:   . Fever over 100.4 degrees or higher . Severe stomach pain not relieved with pain medications . Bright red bleeding that's heavier than a period that does not slow with rest after the first 24 hours . To go the bathroom a lot (frequency), you can't hold your urine (urgency), or it hurts when you empty your bladder (urinate) . Chest pain . Shortness of breath . Pain in the calves of your legs . Severe nausea and vomiting not relieved with anti-nausea medications . Any concerns  What You Can Expect after Surgery . You may see some pink tinged, bloody fluid. This is normal. You may also have cramping for several days.   Activities after Your Discharge Follow these guidelines to help speed your recovery at home: . Don't drive if you are in pain or taking narcotic pain medicine. You may drive when you can safely slam on the  brakes, turn the wheel forcefully, and rotate your torso comfortably. This is typically 4-7 days. Practice in a parking lot or side street prior to attempting to drive regularly.  . Ask others to help with household chores for 4 weeks. . Don't do strenuous activities, exercises, or sports like vacuuming, tennis, squash, etc. until your doctor says it is safe to do so. . Walk as you feel able. Rest often since it may take a week or two for your energy level to return to normal.  . You may climb stairs . Avoid constipation:   -Eat fruits, vegetables, and whole grains. Eat small meals as your appetite will take time to return to normal.   -Drink 6 to 8 glasses of water each day unless your doctor has told you to limit your fluids.   -Use a laxative or stool softener as needed if constipation becomes a problem. You may take Miralax, metamucil, Citrucil, Colace, Senekot, FiberCon, etc. If this does not relieve the constipation, try two tablespoons of Milk Of Magnesia every 8 hours until your bowels move.  . You may shower.  . Do not get in a hot tub,  swimming pool, etc. until your doctor agrees. . Do not douche, use tampons, or have sex until your doctor says it is okay, usually about 2 weeks. . Take your pain medicine when you need it. The medicine may not work as well if the pain is bad.  Take the medicines you were taking before surgery. Other medications you might need are pain medications (ibuprofen), medications for constipation (Colace) and nausea medications (Zofran).

## 2019-10-13 ENCOUNTER — Other Ambulatory Visit: Payer: Self-pay | Admitting: Anatomic Pathology & Clinical Pathology

## 2019-10-13 NOTE — Anesthesia Postprocedure Evaluation (Signed)
Anesthesia Post Note  Patient: Anna Romero  Procedure(s) Performed: DILATATION AND CURETTAGE /HYSTEROSCOPY, POLYPECTOMY OR MYOMECTOMY (N/A )  Patient location during evaluation: PACU Anesthesia Type: General Level of consciousness: awake and alert Pain management: pain level controlled Vital Signs Assessment: post-procedure vital signs reviewed and stable Respiratory status: spontaneous breathing, nonlabored ventilation and respiratory function stable Cardiovascular status: blood pressure returned to baseline and stable Postop Assessment: no apparent nausea or vomiting Anesthetic complications: no     Last Vitals:  Vitals:   10/12/19 1259 10/12/19 1319  BP: (!) 144/66 128/77  Pulse: (!) 52 (!) 56  Resp: 16 16  Temp: (!) 35.8 C   SpO2: 100% 100%    Last Pain:  Vitals:   10/13/19 0807  TempSrc:   PainSc: 0-No pain                 Tera Mater

## 2019-10-14 ENCOUNTER — Encounter: Payer: Medicare Other | Admitting: *Deleted

## 2019-10-14 ENCOUNTER — Other Ambulatory Visit: Payer: Self-pay

## 2019-10-14 DIAGNOSIS — I5022 Chronic systolic (congestive) heart failure: Secondary | ICD-10-CM

## 2019-10-14 DIAGNOSIS — I130 Hypertensive heart and chronic kidney disease with heart failure and stage 1 through stage 4 chronic kidney disease, or unspecified chronic kidney disease: Secondary | ICD-10-CM | POA: Diagnosis not present

## 2019-10-14 NOTE — Progress Notes (Signed)
Daily Session Note  Patient Details  Name: Anna Romero MRN: [id number] Date of Birth: May 06, 1951 Referring Provider:     Pulmonary Rehab from 07/21/2019 in [facility] Cardiac and Pulmonary Rehab  Referring Provider  Nehemiah Massed      Encounter Date: 10/14/2019  Check In: Session Check In - 10/14/19 1508      Check-In   Supervising physician immediately available to respond to emergencies  See telemetry face sheet for immediately available ER MD    Location  [facility]-Cardiac & Pulmonary Rehab    Staff Present  Renita Papa, RN Vickki Hearing, BA, ACSM CEP, Exercise Physiologist;Melissa Caiola RDN, LDN    Virtual Visit  No    Medication changes reported      No    Fall or balance concerns reported     No    Warm-up and Cool-down  Performed on first and last piece of equipment    Resistance Training Performed  Yes    VAD Patient?  No    PAD/SET Patient?  No      Pain Assessment   Currently in Pain?  No/denies          Social History   Tobacco Use  Smoking Status Never Smoker  Smokeless Tobacco Never Used    Goals Met:  Independence with exercise equipment Exercise tolerated well No report of cardiac concerns or symptoms Strength training completed today  Goals Unmet:  Not Applicable  Comments: Pt able to follow exercise prescription today without complaint.  Will continue to monitor for progression.    Dr. Emily Filbert is Medical Director for [facility] [facility] and [name].

## 2019-10-20 LAB — SURGICAL PATHOLOGY

## 2019-10-22 ENCOUNTER — Telehealth: Payer: Self-pay

## 2019-10-22 NOTE — Telephone Encounter (Signed)
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 New patient scheduler has not been successful in contacting Ms. Anna Romero. No answer/no voicemail.

## 2019-10-26 ENCOUNTER — Telehealth: Payer: Self-pay

## 2019-10-26 ENCOUNTER — Encounter: Payer: Medicare Other | Attending: Internal Medicine

## 2019-10-26 DIAGNOSIS — N183 Chronic kidney disease, stage 3 unspecified: Secondary | ICD-10-CM | POA: Insufficient documentation

## 2019-10-26 DIAGNOSIS — I130 Hypertensive heart and chronic kidney disease with heart failure and stage 1 through stage 4 chronic kidney disease, or unspecified chronic kidney disease: Secondary | ICD-10-CM | POA: Insufficient documentation

## 2019-10-26 DIAGNOSIS — I272 Pulmonary hypertension, unspecified: Secondary | ICD-10-CM | POA: Insufficient documentation

## 2019-10-26 DIAGNOSIS — F329 Major depressive disorder, single episode, unspecified: Secondary | ICD-10-CM | POA: Insufficient documentation

## 2019-10-26 DIAGNOSIS — Z79899 Other long term (current) drug therapy: Secondary | ICD-10-CM | POA: Insufficient documentation

## 2019-10-26 DIAGNOSIS — F419 Anxiety disorder, unspecified: Secondary | ICD-10-CM | POA: Insufficient documentation

## 2019-10-26 DIAGNOSIS — D472 Monoclonal gammopathy: Secondary | ICD-10-CM | POA: Insufficient documentation

## 2019-10-26 DIAGNOSIS — E785 Hyperlipidemia, unspecified: Secondary | ICD-10-CM | POA: Insufficient documentation

## 2019-10-26 DIAGNOSIS — I5022 Chronic systolic (congestive) heart failure: Secondary | ICD-10-CM | POA: Insufficient documentation

## 2019-10-26 NOTE — Telephone Encounter (Signed)
Received call from Ms. Frede stating she had a missed call from this number. Made her aware that we have had referral and have not been able to contact her for appointment. We got her scheduled for 6/9 at 1400 with Dr. Fransisca Connors. Directions to cancer center given. Letter pulled from outgoing mail.

## 2019-10-26 NOTE — Telephone Encounter (Signed)
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 No answer/no voicemail. I have notified [place] that we have not been able to contact. Letter will be sent to home address to call cancer center for an appointment.

## 2019-10-28 ENCOUNTER — Inpatient Hospital Stay: Payer: Medicare Other | Attending: Obstetrics and Gynecology | Admitting: Obstetrics and Gynecology

## 2019-10-28 ENCOUNTER — Encounter (INDEPENDENT_AMBULATORY_CARE_PROVIDER_SITE_OTHER): Payer: Self-pay

## 2019-10-28 ENCOUNTER — Encounter: Payer: Self-pay | Admitting: Obstetrics and Gynecology

## 2019-10-28 ENCOUNTER — Other Ambulatory Visit: Payer: Self-pay

## 2019-10-28 DIAGNOSIS — E782 Mixed hyperlipidemia: Secondary | ICD-10-CM | POA: Diagnosis not present

## 2019-10-28 DIAGNOSIS — R001 Bradycardia, unspecified: Secondary | ICD-10-CM | POA: Diagnosis not present

## 2019-10-28 DIAGNOSIS — N183 Chronic kidney disease, stage 3 unspecified: Secondary | ICD-10-CM | POA: Diagnosis not present

## 2019-10-28 DIAGNOSIS — C541 Malignant neoplasm of endometrium: Secondary | ICD-10-CM | POA: Diagnosis not present

## 2019-10-28 DIAGNOSIS — I340 Nonrheumatic mitral (valve) insufficiency: Secondary | ICD-10-CM | POA: Insufficient documentation

## 2019-10-28 DIAGNOSIS — I130 Hypertensive heart and chronic kidney disease with heart failure and stage 1 through stage 4 chronic kidney disease, or unspecified chronic kidney disease: Secondary | ICD-10-CM | POA: Insufficient documentation

## 2019-10-28 DIAGNOSIS — K219 Gastro-esophageal reflux disease without esophagitis: Secondary | ICD-10-CM | POA: Insufficient documentation

## 2019-10-28 DIAGNOSIS — I272 Pulmonary hypertension, unspecified: Secondary | ICD-10-CM | POA: Diagnosis not present

## 2019-10-28 DIAGNOSIS — Z79899 Other long term (current) drug therapy: Secondary | ICD-10-CM | POA: Insufficient documentation

## 2019-10-28 DIAGNOSIS — F419 Anxiety disorder, unspecified: Secondary | ICD-10-CM | POA: Insufficient documentation

## 2019-10-28 DIAGNOSIS — M1990 Unspecified osteoarthritis, unspecified site: Secondary | ICD-10-CM | POA: Diagnosis not present

## 2019-10-28 DIAGNOSIS — E559 Vitamin D deficiency, unspecified: Secondary | ICD-10-CM | POA: Diagnosis not present

## 2019-10-28 DIAGNOSIS — J449 Chronic obstructive pulmonary disease, unspecified: Secondary | ICD-10-CM | POA: Insufficient documentation

## 2019-10-28 DIAGNOSIS — F329 Major depressive disorder, single episode, unspecified: Secondary | ICD-10-CM | POA: Diagnosis not present

## 2019-10-28 DIAGNOSIS — I5022 Chronic systolic (congestive) heart failure: Secondary | ICD-10-CM | POA: Diagnosis not present

## 2019-10-28 NOTE — Progress Notes (Signed)
Gynecologic Oncology Consult Visit   Referring Provider: Dr. Leafy Ro  Chief Concern: endometrial cancer  Subjective:  Anna Romero is a 69 y.o. female who is seen in consultation from Dr. Leafy Ro for endometrial cancer.  Bleeding started and Pt called 09/01/2019 with c/o with first time PMB. She was having an actual period with good flow for a few days, no cramping. Initial bleeding lasted for 2.5 days, then stopped, then restarted again the following week lasting also only a few days. Saw Dr Leafy Ro in [place] who recommended D&C.   D&C/Hysteroscopy 10/12/19 Uterus measuring 7 cm by sound; normal cervix, vagina, perineum. Atrophic endometrium with 3 large masses. 2 appeared to be polyps and one was irregular and calcified. This one did not get removed entirely by the [facility] but a gritty texture was noted after curreting.   DIAGNOSIS:  A. 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  - PORTIONS OF BENIGN ECTOCERVICAL TISSUE.  - NO DEFINITIVE MYOMETRIAL TISSUE PRESENT TO EVALUATE FOR INVASION.   B. ENDOCERVIX; CURETTAGE:  - SUPERFICIAL FRAGMENTS OF BENIGN ENDOCERVICAL AND ECTOCERVICAL TISSUE.  - NEGATIVE FOR DYSPLASIA AND MALIGNANCY.   C. ENDOMETRIAL POLYP; CURETTAGE:  - DISRUPTED POLYPOID FRAGMENTS OF ENDOMETRIOID ENDOMETRIAL  ADENOCARCINOMA, LOW GRADE (WHO).  - NO DEFINITIVE MYOMETRIAL TISSUE PRESENT FOR EVALUATION OF INVASION.   Comment:  The tumor predominantly displays a glandular pattern, with minimal solid  growth. The findings are intermediate between FIGO grade 1 and grade 2,  with a FIGO grade 1 tumor favored in this fragmented specimen. Overall  WHO classification would recommend defining grade 1 and 2 tumors as "low  grade".      ADDENDUM:  Immunohistochemistry (IHC) Testing for DNA Mismatch Repair (MMR)  Proteins:  Results:  MLH1: Intact nuclear expression  MSH2: Intact nuclear expression   MSH6: Intact nuclear expression  PMS2: Intact nuclear expression  IHC Interpretation: No loss of nuclear expression of MMR proteins: Low  probability of MSI-H.   No further bleeding after hysteroscopy and D&C.  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 pulmonary hypertension multifactorial in nature with groupings including secondary causes of chf and valve disease.  She has gained 30 pounds in the past year from eating too much, not due to fluid retention.  She is in cardiac/pulmonary rehab. Last saw her cardiologist Dr Nehemiah Massed in 3/21 and assessed to be stable.    Problem List: Patient Active Problem List   Diagnosis Date Noted  . Cough 08/03/2019  . Mild intermittent asthma 12/16/2018  . 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 Depression 07/15/2018  . Polymyositis ([place]) 05/30/2017  . Chronic kidney insufficiency, stage 3 (moderate) 03/26/2017  . Breast calcification, right 01/30/2017  . Polyarthralgia 10/12/2016  . Myalgia 10/12/2016  . Right elbow pain 10/12/2016  . Bradycardia 04/04/2016  . Chronic insomnia 03/31/2016  . Mild episode of recurrent major depressive disorder ([place]) 03/31/2016  . Cognitive impairment 03/26/2016  . Gastroesophageal reflux 09/23/2015  . 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 Moderate mitral insufficiency 05/24/2015  . Chronic systolic CHF (congestive heart failure), NYHA class 2 ([place]) 04/18/2015  . High risk medication use 03/21/2015  . Benign essential hypertension 10/01/2014  . Hypomagnesemia 09/08/2014  . Vitamin D deficiency, unspecified 09/08/2014  .  Osteoarthritis 04/05/2014  . Degenerative disc disease, cervical 03/29/2014  . Hyperlipidemia, mixed 03/29/2014  . Monoclonal  gammopathy 03/29/2014  . Pulmonary hypertension ([place]) 10/21/2013    Past Medical History: Past Medical History:  Diagnosis Date  . Anxiety   . Asthma   . CHF (congestive heart failure) ([place])   . Chronic kidney disease    Renal Insufficiency Stage 3  . Degenerative disc disease, cervical   . Depression   . GERD (gastroesophageal reflux disease)   . Hyperlipidemia   . Hypertension   . Mixed restrictive and obstructive lung disease ([place])    Predominantly restriction due to chronic volume loss in the left lung.  Mild small airways disease.  . Moderate mitral insufficiency   . Monoclonal gammopathy   . Polymyositis ([place])   . Pulmonary hypertension ([place])     Past Surgical History: Past Surgical History:  Procedure Laterality Date  . APPENDECTOMY    . BREAST EXCISIONAL BIOPSY Left 1990   negative  . CATARACT EXTRACTION    . COLONOSCOPY N/A 08/27/2016   Procedure: COLONOSCOPY;  Surgeon: Lollie Sails, MD;  Location: [facility] ENDOSCOPY;  Service: Endoscopy;  Laterality: N/A;  . COLONOSCOPY WITH PROPOFOL N/A 07/22/2018   Procedure: COLONOSCOPY WITH PROPOFOL;  Surgeon: Lollie Sails, MD;  Location: [facility] ENDOSCOPY;  Service: Endoscopy;  Laterality: N/A;  . HYSTEROSCOPY WITH D & C N/A 10/12/2019   Procedure: DILATATION AND CURETTAGE /HYSTEROSCOPY, POLYPECTOMY OR MYOMECTOMY;  Surgeon: Benjaman Kindler, MD;  Location: [facility] ORS;  Service: Gynecology;  Laterality: N/A;  . JOINT REPLACEMENT    . REPLACEMENT TOTAL HIP W/  RESURFACING IMPLANTS Right   . THYMECTOMY     Family History: Family History  Adopted: Yes  Problem Relation Age of Onset  . Breast cancer Neg Hx     Social History: Social History   Socioeconomic History  . Marital status: Married    Spouse name: matthew  . Number of children: 1  . Years of education: Not on file  . Highest education level: Associate degree: occupational, Hotel manager, or vocational program  Occupational History    Comment: disabled  Tobacco  Use  . Smoking status: Never Smoker  . Smokeless tobacco: Never Used  Substance and Sexual Activity  . Alcohol use: No  . Drug use: No  . Sexual activity: Yes    Birth control/protection: None  Other Topics Concern  . Not on file  Social History Narrative  . Not on file   Social Determinants of Health   Financial Resource Strain:   . Difficulty of Paying Living Expenses:   Food Insecurity:   . Worried About Charity fundraiser in the Last Year:   . Arboriculturist in the Last Year:   Transportation Needs:   . Film/video editor (Medical):   Marland Kitchen Lack of Transportation (Non-Medical):   Physical Activity:   . Days of Exercise per Week:   . Minutes of Exercise per Session:   Stress:   . Feeling of Stress :   Social Connections:   . Frequency of Communication with Friends and Family:   . Frequency of Social Gatherings with Friends and Family:   . Attends Religious Services:   . Active Member of Clubs or Organizations:   . Attends Archivist Meetings:   Marland Kitchen Marital Status:   Intimate Partner Violence:   . Fear of Current or Ex-Partner:   . Emotionally Abused:   Marland Kitchen Physically Abused:   . Sexually Abused:  Allergies: Allergies  Allergen Reactions  . Hctz [Hydrochlorothiazide] Other (See Comments)    Kidney Disorder    Current Medications: Current Outpatient Medications  Medication Sig Dispense Refill  . acetaminophen (TYLENOL) 500 MG tablet Take 1,000 mg by mouth every 8 (eight) hours as needed for moderate pain.     Marland Kitchen buPROPion (WELLBUTRIN SR) 200 MG 12 hr tablet Take 200 mg by mouth every morning.     . carvedilol (COREG) 12.5 MG tablet Take by mouth.    . clonazePAM (KLONOPIN) 0.5 MG tablet Take 0.5 mg by mouth 2 (two) times daily as needed for anxiety.    . donepezil (ARICEPT) 10 MG tablet Take 10 mg by mouth at bedtime.    Marland Kitchen doxepin (SINEQUAN) 25 MG capsule Take 25-50 mg by mouth at bedtime as needed (sleep).     . memantine (NAMENDA) 5 MG tablet  Take 5 mg by mouth 2 (two) times daily.    . Multiple Vitamins-Minerals (MULTIVITAMIN WITH MINERALS) tablet Take 1 tablet by mouth daily.    Marland Kitchen spironolactone (ALDACTONE) 25 MG tablet Take 25 mg by mouth daily.    Marland Kitchen telmisartan (MICARDIS) 40 MG tablet Take 40 mg by mouth daily.    Marland Kitchen albuterol (VENTOLIN HFA) 108 (90 Base) MCG/ACT inhaler Inhale 1-2 puffs into the lungs every 6 (six) hours as needed for wheezing or shortness of breath. (Patient not taking: Reported on 10/28/2019) 18 g 5  . carvedilol (COREG) 12.5 MG tablet Take 12.5 mg by mouth 2 (two) times daily with a meal.    . furosemide (LASIX) 20 MG tablet Take 20 mg by mouth daily as needed for fluid.     Marland Kitchen omeprazole (PRILOSEC) 20 MG capsule Take 20 mg by mouth daily.    . traZODone (DESYREL) 100 MG tablet Take 100-200 mg by mouth at bedtime as needed for sleep.      No current facility-administered medications for this visit.    Review of Systems General: negative for, fevers, chills, fatigue, changes in sleep, changes in weight or appetite Skin: negative for changes in color, texture, moles or lesions Eyes: negative for, changes in vision, pain, diplopia HEENT: negative for, change in hearing, pain, discharge, tinnitus, vertigo, voice changes, sore throat, neck masses Breasts: negative for breast lumps Pulmonary: negative for, productive cough Cardiac: negative for, palpitations, syncope, pain, discomfort, pressure Gastrointestinal: negative for, dysphagia, nausea, vomiting, jaundice, pain, constipation, diarrhea, hematemesis, hematochezia Genitourinary/Sexual: negative for, dysuria, discharge, hesitancy, nocturia, retention, stones, infections, STD's, incontinence Ob/Gyn: negative for, irregular bleeding, pain Musculoskeletal: negative for, pain, stiffness, swelling, range of motion limitation Hematology: negative for, easy bruising, bleeding Neurologic/Psych: negative for, headaches, seizures, paralysis, weakness, tremor, change in  gait, change in sensation, mood swings, depression, anxiety, change in memory  Objective:  Physical Examination:  BP 121/74 (BP Location: Left Arm, Patient Position: Sitting)   Pulse (!) 56   Temp (!) 95.6 F (35.3 C) (Tympanic)   Resp 16   Wt 188 lb 9.6 oz (85.5 kg)   SpO2 100%   BMI 29.98 kg/m    ECOG Performance Status: 2 - Symptomatic, <50% confined to bed  General appearance: chronically ill appearing and needed help going up one step onto exam table.  HEENT:PERRLA, neck supple with midline trachea and thyroid without masses Lymph node survey: non-palpable, axillary, inguinal, supraclavicular Cardiovascular: regular rate and rhythm, possible gallop heard Respiratory: normal air entry, lungs clear to auscultation and no rales, rhonchi or wheezing Breast exam: not examined. Abdomen: soft, non-tender, without  masses or organomegaly, no hernias and well healed incision Back: inspection of back is normal Extremities: extremities normal, atraumatic, no cyanosis or edema superficial varices in lower legs Skin exam - normal coloration and turgor, no rashes, no suspicious skin lesions noted. Neurological exam reveals alert, oriented, normal speech, no focal findings or movement disorder noted.  Pelvic: exam chaperoned by nurse, EGBUS within normal limits;  Vulva: normal appearing vulva with no masses, tenderness or lesions; Vagina: normal vagina; Adnexa: normal adnexa in size, nontender and no masses; Uterus: uterus is normal size, shape, consistency and nontender; Cervix: anteverted; Rectal: normal rectal, no masses.  Lab Results  Component Value Date   WBC 7.4 10/08/2019   HGB 13.3 10/08/2019   HCT 40.0 10/08/2019   MCV 97.8 10/08/2019   PLT 184 10/08/2019     Chemistry      Component Value Date/Time   NA 139 10/08/2019 1341   NA 140 01/26/2014 0558   K 4.1 10/08/2019 1341   K 3.8 01/26/2014 0558   CL 104 10/08/2019 1341   CL 107 01/26/2014 0558   CO2 25 10/08/2019 1341    CO2 27 01/26/2014 0558   BUN 23 10/08/2019 1341   BUN 22 (H) 01/26/2014 0558   CREATININE 1.04 (H) 10/08/2019 1341   CREATININE 1.11 01/26/2014 0558      Component Value Date/Time   CALCIUM 9.4 10/08/2019 1341   CALCIUM 9.1 01/26/2014 0558   ALKPHOS 122 03/11/2012 1023   AST 35 03/11/2012 1023   ALT 37 03/11/2012 1023   BILITOT 0.4 03/11/2012 1023      Assessment:  Anna Romero is a 69 y.o. female diagnosed with grade 1-2 endometrial cancer in polyps s/p D&C with Myosure resection.  Rest of endometrium atrophic. Poor performance status and in cardiac/pulmonary rehab.  Immunohistochemistry (IHC) Testing for DNA Mismatch Repair (MMR) shows retained expression of all four proteins.  Medical co-morbidities complicating care:  CHF with significant deconditioning.   Moderate mitral insufficiency  Chronic kidney disease  Renal Insufficiency Stage 3 Depression  Hypertension  Mixed restrictive and obstructive lung disease ([place]): Predominantly restriction due to chronic volume loss in the left lung.  Mild small airways disease. Pulmonary hypertension multifactorial in nature with groupings including secondary causes of chf and valve disease.  Plan:   Problem List Items Addressed This Visit      Genitourinary   Endometrial cancer [facility])     We discussed options for management, which usually involves hysterectomy.  However, in view of CHF with reduced EF and other medical issues and possible complete removal of disease in polyps with Myosure would suggest Mirena IUD rather than primary surgical management.  Will order MRI of abdomen and pelvis to rule out significant myometrial invasion or metastatic disease.  If MRI reassuring Dr Leafy Ro can place Mirena IUD in the office and we can see her back in 2 months for follow up endometrial biopsy.    We will also ask her cardiologist Dr Nehemiah Massed about how risky a hysterectomy would be in view of her CHF.  I would likely recommend a  non-surgical option to start in view of high efficacy of Mirena IUD in low grade disease with about 75% regression rate.  Hysterectomy could be considered in follow up biopsies showed persistent disease.   The patient's diagnosis, an outline of the further diagnostic and laboratory studies which will be required, the recommendation, and alternatives were discussed.  All questions were answered to the patient's satisfaction.  A total  of 60 minutes were spent with the patient/family today; 60 % was spent in education, counseling and coordination of care for endometrial cancer.    Mellody Drown, MD   CC:  Ezequiel Kayser, MD [facility] [place],  [place] [address] [phone]

## 2019-10-28 NOTE — Progress Notes (Signed)
Patient here for gyn oconology visit c/o of back pain and vaginal bleeding lasted 1-2 days.

## 2019-10-28 NOTE — Progress Notes (Signed)
[place]  [phone] [phone]  Patient Care Team: Ezequiel Kayser, MD as PCP - General (Internal Medicine) Clent Jacks, RN as Oncology Nurse Navigator   Name of the patient: Anna Romero  [id number]  Apr 17, 1951   Date of visit: 10/28/2019  Diagnosis: 1. Endometrial cancer [facility])   Chief Complaint: Post menopausal bleeding with endometrial polyps. Underwent Hysteroscopy with Dilation and Curettage (Completed by Dr. Leafy Ro, 10/12/2019).   Current Treatment: No current treatments, New patient.    SUBJECTIVE  ECOG: 1 - Symptomatic but completely ambulatory   HPI:  [name] is a 69 year old G3P1 female who was referred by Dr. Leafy Ro. She originally presented to Dr. Migdalia Dk office with complaints consistent with PMB. She undewent surgery (Hysteroscopy with Dilation and Curettage), with Dr. Leafy Ro. Surgical path was concerning for     She underwent a TSH on 10/12/19 and findings included: FINDINGS Uterus measuring 7 cm by sound; normal cervix, vagina, perineum. Atrophic endometrium with 3 large masses. 2 appeared to be polyps and one was irregular and calcified. This one did not get removed entirely by the [facility] but a gritty texture was noted after curreting.   Surgical Path completed 10/12/19 showed:  DIAGNOSIS:  A. 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  - PORTIONS OF BENIGN ECTOCERVICAL TISSUE.  - NO DEFINITIVE MYOMETRIAL TISSUE PRESENT TO EVALUATE FOR INVASION.   B. ENDOCERVIX; CURETTAGE:  - SUPERFICIAL FRAGMENTS OF BENIGN ENDOCERVICAL AND ECTOCERVICAL TISSUE.  - NEGATIVE FOR DYSPLASIA AND MALIGNANCY.   C. ENDOMETRIAL POLYP; CURETTAGE:  - DISRUPTED POLYPOID FRAGMENTS OF ENDOMETRIOID ENDOMETRIAL  ADENOCARCINOMA, LOW GRADE (WHO).  - NO DEFINITIVE MYOMETRIAL TISSUE PRESENT FOR EVALUATION OF INVASION.   She was  referred to [facility] for further workup, evaluation, and management.    Review of Systems  Constitutional: Positive for weight loss. Negative for chills and fever.  Respiratory: Negative for cough and hemoptysis.   Cardiovascular: Negative for chest pain and palpitations.  Gastrointestinal: Negative for abdominal pain, constipation, diarrhea, nausea and vomiting.  Genitourinary:       PMB (before HIST); None currently   Musculoskeletal: Positive for back pain, joint pain and myalgias.  Neurological: Negative for dizziness and headaches.  Endo/Heme/Allergies: Does not bruise/bleed easily.    Past Medical History:  Diagnosis Date  . Anxiety   . Asthma   . CHF (congestive heart failure) ([place])   . Chronic kidney disease    Renal Insufficiency Stage 3  . Degenerative disc disease, cervical   . Depression   . GERD (gastroesophageal reflux disease)   . Hyperlipidemia   . Hypertension   . Mixed restrictive and obstructive lung disease ([place])    Predominantly restriction due to chronic volume loss in the left lung.  Mild small airways disease.  . Moderate mitral insufficiency   . Monoclonal gammopathy   . Polymyositis ([place])   . Pulmonary hypertension ([place])     Past Surgical History:  Procedure Laterality Date  . APPENDECTOMY    . BREAST EXCISIONAL BIOPSY Left 1990   negative  . CATARACT EXTRACTION    . COLONOSCOPY N/A 08/27/2016   Procedure: COLONOSCOPY;  Surgeon: Lollie Sails, MD;  Location: [facility] ENDOSCOPY;  Service: Endoscopy;  Laterality: N/A;  . COLONOSCOPY WITH PROPOFOL N/A 07/22/2018   Procedure: COLONOSCOPY WITH PROPOFOL;  Surgeon: Lollie Sails, MD;  Location: [facility] ENDOSCOPY;  Service: Endoscopy;  Laterality: N/A;  . HYSTEROSCOPY  WITH D & C N/A 10/12/2019   Procedure: DILATATION AND CURETTAGE /HYSTEROSCOPY, POLYPECTOMY OR MYOMECTOMY;  Surgeon: Benjaman Kindler, MD;  Location: [facility] ORS;  Service: Gynecology;  Laterality: N/A;  . JOINT REPLACEMENT    . REPLACEMENT  TOTAL HIP W/  RESURFACING IMPLANTS Right   . THYMECTOMY      Family History  Adopted: Yes  Problem Relation Age of Onset  . Breast cancer Neg Hx     Social History   Socioeconomic History  . Marital status: Married    Spouse name: matthew  . Number of children: 1  . Years of education: Not on file  . Highest education level: Associate degree: occupational, Hotel manager, or vocational program  Occupational History    Comment: disabled  Tobacco Use  . Smoking status: Never Smoker  . Smokeless tobacco: Never Used  Substance and Sexual Activity  . Alcohol use: No  . Drug use: No  . Sexual activity: Yes    Birth control/protection: None  Other Topics Concern  . Not on file  Social History Narrative  . Not on file   Social Determinants of Health   Financial Resource Strain:   . Difficulty of Paying Living Expenses:   Food Insecurity:   . Worried About Charity fundraiser in the Last Year:   . Arboriculturist in the Last Year:   Transportation Needs:   . Film/video editor (Medical):   Marland Kitchen Lack of Transportation (Non-Medical):   Physical Activity:   . Days of Exercise per Week:   . Minutes of Exercise per Session:   Stress:   . Feeling of Stress :   Social Connections:   . Frequency of Communication with Friends and Family:   . Frequency of Social Gatherings with Friends and Family:   . Attends Religious Services:   . Active Member of Clubs or Organizations:   . Attends Archivist Meetings:   Marland Kitchen Marital Status:     Allergies  Allergen Reactions  . Hctz [Hydrochlorothiazide] Other (See Comments)    Kidney Disorder    Current Outpatient Medications  Medication Instructions  . acetaminophen (TYLENOL) 1,000 mg, Oral, Every 8 hours PRN  . albuterol (VENTOLIN HFA) 108 (90 Base) MCG/ACT inhaler 1-2 puffs, Inhalation, Every 6 hours PRN  . buPROPion (WELLBUTRIN SR) 200 mg, Oral, BH-each morning  . carvedilol (COREG) 12.5 MG tablet Oral  . carvedilol  (COREG) 12.5 mg, Oral, 2 times daily with meals  . clonazePAM (KLONOPIN) 0.5 mg, Oral, 2 times daily PRN  . donepezil (ARICEPT) 10 mg, Oral, Daily at bedtime  . doxepin (SINEQUAN) 25-50 mg, Oral, At bedtime PRN  . furosemide (LASIX) 20 mg, Oral, Daily PRN  . memantine (NAMENDA) 5 mg, Oral, 2 times daily  . Multiple Vitamins-Minerals (MULTIVITAMIN WITH MINERALS) tablet 1 tablet, Oral, Daily  . omeprazole (PRILOSEC) 20 mg, Oral, Daily  . spironolactone (ALDACTONE) 25 mg, Oral, Daily  . telmisartan (MICARDIS) 40 mg, Oral, Daily  . traZODone (DESYREL) 100-200 mg, Oral, At bedtime PRN    OBJECTIVE  BP 121/74 (BP Location: Left Arm, Patient Position: Sitting)   Pulse (!) 56   Temp (!) 95.6 F (35.3 C) (Tympanic)   Resp 16   Wt 85.5 kg   SpO2 100%   BMI 29.98 kg/m   Physical Exam Constitutional:      Appearance: She is obese.  HENT:     Head: Normocephalic.  Eyes:     Pupils: Pupils are equal, round, and  reactive to light.  Cardiovascular:     Rate and Rhythm: Normal rate.     Heart sounds: Gallop present.   Pulmonary:     Effort: Pulmonary effort is normal.  Abdominal:     General: Bowel sounds are normal.     Palpations: Abdomen is soft.  Genitourinary:    General: Normal vulva.     Rectum: Normal.  Musculoskeletal:        General: Normal range of motion.     Cervical back: Normal range of motion.  Skin:    General: Skin is warm and dry.  Neurological:     Mental Status: She is alert.  Psychiatric:        Mood and Affect: Mood normal.        Behavior: Behavior normal.        Thought Content: Thought content normal.        Judgment: Judgment normal.     ASSESSMENT & PLAN:  Endometrial Cancer      . . . Thank you for allowing me to participate in the care of this very pleasant patient!  [place], Student FNP

## 2019-10-29 ENCOUNTER — Telehealth: Payer: Self-pay

## 2019-10-29 NOTE — Telephone Encounter (Signed)
Called and notified Ms. Danne Baxter with MRI appointment details and instructions. Read back performed.

## 2019-11-04 ENCOUNTER — Encounter: Payer: Self-pay | Admitting: *Deleted

## 2019-11-04 ENCOUNTER — Other Ambulatory Visit: Payer: Self-pay

## 2019-11-04 ENCOUNTER — Encounter: Payer: Medicare Other | Admitting: *Deleted

## 2019-11-04 DIAGNOSIS — F419 Anxiety disorder, unspecified: Secondary | ICD-10-CM | POA: Diagnosis not present

## 2019-11-04 DIAGNOSIS — I130 Hypertensive heart and chronic kidney disease with heart failure and stage 1 through stage 4 chronic kidney disease, or unspecified chronic kidney disease: Secondary | ICD-10-CM | POA: Diagnosis not present

## 2019-11-04 DIAGNOSIS — N183 Chronic kidney disease, stage 3 unspecified: Secondary | ICD-10-CM | POA: Diagnosis not present

## 2019-11-04 DIAGNOSIS — D472 Monoclonal gammopathy: Secondary | ICD-10-CM | POA: Diagnosis not present

## 2019-11-04 DIAGNOSIS — I5022 Chronic systolic (congestive) heart failure: Secondary | ICD-10-CM

## 2019-11-04 DIAGNOSIS — I272 Pulmonary hypertension, unspecified: Secondary | ICD-10-CM | POA: Diagnosis not present

## 2019-11-04 DIAGNOSIS — E785 Hyperlipidemia, unspecified: Secondary | ICD-10-CM | POA: Diagnosis not present

## 2019-11-04 DIAGNOSIS — Z79899 Other long term (current) drug therapy: Secondary | ICD-10-CM | POA: Diagnosis not present

## 2019-11-04 DIAGNOSIS — F329 Major depressive disorder, single episode, unspecified: Secondary | ICD-10-CM | POA: Diagnosis not present

## 2019-11-04 NOTE — Progress Notes (Signed)
Pulmonary Individual Treatment Plan  Patient Details  Name: Anna Romero MRN: [id number] Date of Birth: Sep 23, 1950 Referring Provider:     Pulmonary Rehab from 07/21/2019 in [facility] Re[facility]  Referring Provider Nehemiah Massed      Initial Encounter Date:    Pulmonary Rehab from 07/21/2019 in [facility] Cardiac and Pulmonary Rehab  Date 07/21/19      Visit Diagnosis: Chronic systolic CHF (congestive heart failure), NYHA class 2 ([place])  Patient's Home Medications on Admission:  Current Outpatient Medications:  .  acetaminophen (TYLENOL) 500 MG tablet, Take 1,000 mg by mouth every 8 (eight) hours as needed for moderate pain. , Disp: , Rfl:  .  albuterol (VENTOLIN HFA) 108 (90 Base) MCG/ACT inhaler, Inhale 1-2 puffs into the lungs every 6 (six) hours as needed for wheezing or shortness of breath. (Patient not taking: Reported on 10/28/2019), Disp: 18 g, Rfl: 5 .  buPROPion (WELLBUTRIN SR) 200 MG 12 hr tablet, Take 200 mg by mouth every morning. , Disp: , Rfl:  .  carvedilol (COREG) 12.5 MG tablet, Take 12.5 mg by mouth 2 (two) times daily with a meal., Disp: , Rfl:  .  carvedilol (COREG) 12.5 MG tablet, Take by mouth., Disp: , Rfl:  .  clonazePAM (KLONOPIN) 0.5 MG tablet, Take 0.5 mg by mouth 2 (two) times daily as needed for anxiety., Disp: , Rfl:  .  donepezil (ARICEPT) 10 MG tablet, Take 10 mg by mouth at bedtime., Disp: , Rfl:  .  doxepin (SINEQUAN) 25 MG capsule, Take 25-50 mg by mouth at bedtime as needed (sleep). , Disp: , Rfl:  .  furosemide (LASIX) 20 MG tablet, Take 20 mg by mouth daily as needed for fluid. , Disp: , Rfl:  .  memantine (NAMENDA) 5 MG tablet, Take 5 mg by mouth 2 (two) times daily., Disp: , Rfl:  .  Multiple Vitamins-Minerals (MULTIVITAMIN WITH MINERALS) tablet, Take 1 tablet by mouth daily., Disp: , Rfl:  .  omeprazole (PRILOSEC) 20 MG capsule, Take 20 mg by mouth daily., Disp: , Rfl:  .  spironolactone (ALDACTONE) 25 MG tablet, Take 25 mg by mouth daily.,  Disp: , Rfl:  .  telmisartan (MICARDIS) 40 MG tablet, Take 40 mg by mouth daily., Disp: , Rfl:  .  traZODone (DESYREL) 100 MG tablet, Take 100-200 mg by mouth at bedtime as needed for sleep. , Disp: , Rfl:   Past Medical History: Past Medical History:  Diagnosis Date  . Anxiety   . Asthma   . CHF (congestive heart failure) ([place])   . Chronic kidney disease    Renal Insufficiency Stage 3  . Degenerative disc disease, cervical   . Depression   . GERD (gastroesophageal reflux disease)   . Hyperlipidemia   . Hypertension   . Mixed restrictive and obstructive lung disease ([place])    Predominantly restriction due to chronic volume loss in the left lung.  Mild small airways disease.  . Moderate mitral insufficiency   . Monoclonal gammopathy   . Polymyositis ([place])   . Pulmonary hypertension (HCC)     Tobacco Use: Social History   Tobacco Use  Smoking Status Never Smoker  Smokeless Tobacco Never Used    Labs: Recent Review Flowsheet Data   There is no flowsheet data to display.      Pulmonary Assessment Scores:   UCSD: Self-administered rating of dyspnea associated with activities of daily living (ADLs) 6-point scale (0 = "not at all" to 5 = "maximal or unable  to do because of breathlessness")  Scoring Scores range from 0 to 120.  Minimally important difference is 5 units  CAT: CAT can identify the health impairment of COPD patients and is better correlated with disease progression.  CAT has a scoring range of zero to 40. The CAT score is classified into four groups of low (less than 10), medium (10 - 20), high (21-30) and very high (31-40) based on the impact level of disease on health status. A CAT score over 10 suggests significant symptoms.  A worsening CAT score could be explained by an exacerbation, poor medication adherence, poor inhaler technique, or progression of COPD or comorbid conditions.  CAT MCID is 2 points  mMRC: mMRC (Modified Medical Research Council)  Dyspnea Scale is used to assess the degree of baseline functional disability in patients of respiratory disease due to dyspnea. No minimal important difference is established. A decrease in score of 1 point or greater is considered a positive change.   Pulmonary Function Assessment:   Exercise Target Goals: Exercise Program Goal: Individual exercise prescription set using results from initial 6 min walk test and THRR while considering  patient's activity barriers and safety.   Exercise Prescription Goal: Initial exercise prescription builds to 30-45 minutes a day of aerobic activity, 2-3 days per week.  Home exercise guidelines will be given to patient during program as part of exercise prescription that the participant will acknowledge.  Education: Aerobic Exercise & Resistance Training: - Gives group verbal and written instruction on the various components of exercise. Focuses on aerobic and resistive training programs and the benefits of this training and how to safely progress through these programs..   Education: Exercise & Equipment Safety: - Individual verbal instruction and demonstration of equipment use and safety with use of the equipment.   Pulmonary Rehab from 07/21/2019 in [facility] Cardiac and Pulmonary Rehab  Date 07/21/19  Educator AS  Instruction Review Code 1- Verbalizes Understanding      Education: Exercise Physiology & General Exercise Guidelines: - Group verbal and written instruction with models to review the exercise physiology of the cardiovascular system and associated critical values. Provides general exercise guidelines with specific guidelines to those with heart or lung disease.    Education: Flexibility, Balance, Mind/Body Relaxation: Provides group verbal/written instruction on the benefits of flexibility and balance training, including mind/body exercise modes such as yoga, pilates and tai chi.  Demonstration and skill practice provided.   Activity Barriers  & Risk Stratification:   6 Minute Walk:  Oxygen Initial Assessment:   Oxygen Re-Evaluation:  Oxygen Re-Evaluation    Row Name 09/23/19 1534 09/30/19 1534           Program Oxygen Prescription   Program Oxygen Prescription None None        Home Oxygen   Home Oxygen Device None None      Sleep Oxygen Prescription None  Dr said she didnt have sleep apnea anymore None      Home Exercise Oxygen Prescription None None      Home at Rest Exercise Oxygen Prescription None None      Compliance with Home Oxygen Use -- Yes        Goals/Expected Outcomes   Short Term Goals To learn and exhibit compliance with exercise, home and travel O2 prescription;To learn and understand importance of monitoring SPO2 with pulse oximeter and demonstrate accurate use of the pulse oximeter.;To learn and understand importance of maintaining oxygen saturations>88%;To learn and demonstrate proper pursed lip breathing  techniques or other breathing techniques.;To learn and demonstrate proper use of respiratory medications --      Long  Term Goals -- Exhibits compliance with exercise, home and travel O2 prescription;Verbalizes importance of monitoring SPO2 with pulse oximeter and return demonstration;Maintenance of O2 saturations>88%;Exhibits proper breathing techniques, such as pursed lip breathing or other method taught during program session;Compliance with respiratory medication;Demonstrates proper use of MDI's      Comments Reviewed PLB technique with pt.  Talked about how it works and it's importance in maintaining their exercise saturations. --      Goals/Expected Outcomes Short: Become more profiecient at using PLB.   Long: Become independent at using PLB. --             Oxygen Discharge (Final Oxygen Re-Evaluation):  Oxygen Re-Evaluation - 09/30/19 1534      Program Oxygen Prescription   Program Oxygen Prescription None      Home Oxygen   Home Oxygen Device None    Sleep Oxygen Prescription None     Home Exercise Oxygen Prescription None    Home at Rest Exercise Oxygen Prescription None    Compliance with Home Oxygen Use Yes      Goals/Expected Outcomes   Long  Term Goals Exhibits compliance with exercise, home and travel O2 prescription;Verbalizes importance of monitoring SPO2 with pulse oximeter and return demonstration;Maintenance of O2 saturations>88%;Exhibits proper breathing techniques, such as pursed lip breathing or other method taught during program session;Compliance with respiratory medication;Demonstrates proper use of MDI's           Initial Exercise Prescription:   Perform Capillary Blood Glucose checks as needed.  Exercise Prescription Changes:  Exercise Prescription Changes    Row Name 09/22/19 1500 10/07/19 0900 10/20/19 1600         Response to Exercise   Blood Pressure (Admit) 140/80 120/64 108/60     Blood Pressure (Exercise) 152/82 142/82 130/74     Blood Pressure (Exit) 118/60 118/62 114/68     Heart Rate (Admit) 64 bpm 71 bpm 58 bpm     Heart Rate (Exercise) 76 bpm 76 bpm 82 bpm     Heart Rate (Exit) 67 bpm 66 bpm 55 bpm     Oxygen Saturation (Admit) 97 % 98 % 96 %     Oxygen Saturation (Exercise) 96 % 95 % 93 %     Oxygen Saturation (Exit) 95 % 98 % 97 %     Rating of Perceived Exertion (Exercise) '10 14 14     ' Perceived Dyspnea (Exercise) 0 0 0     Symptoms none none none     Duration Continue with 30 min of aerobic exercise without signs/symptoms of physical distress. Continue with 30 min of aerobic exercise without signs/symptoms of physical distress. Continue with 30 min of aerobic exercise without signs/symptoms of physical distress.     Intensity THRR unchanged THRR unchanged THRR unchanged       Progression   Progression Continue to progress workloads to maintain intensity without signs/symptoms of physical distress. Continue to progress workloads to maintain intensity without signs/symptoms of physical distress. Continue to progress  workloads to maintain intensity without signs/symptoms of physical distress.     Average METs 2.35 2.8 2.11       Resistance Training   Training Prescription Yes Yes Yes     Weight  3 lb 3 lb 3 lb     Reps 10-15 10-15 10-15       Interval Training  Interval Training No No No       Treadmill   MPH 2.3 2.5 2.5     Grade 0 0 0     Minutes '15 15 15     ' METs 2.76 2.9 2.91       NuStep   Level 2 1 --     SPM -- 80 --     Minutes 15 15 --     METs 1.3 1.7 --       REL-XR   Level 1 -- 4     Minutes 15 -- 15     METs 3 -- 1.3            Exercise Comments:   Exercise Goals and Review:   Exercise Goals Re-Evaluation :  Exercise Goals Re-Evaluation    Row Name 09/22/19 1530 09/23/19 1536 10/07/19 1002 10/20/19 1613       Exercise Goal Re-Evaluation   Exercise Goals Review Increase Physical Activity;Increase Strength and Stamina;Understanding of Exercise Prescription Increase Physical Activity;Increase Strength and Stamina;Understanding of Exercise Prescription Increase Physical Activity;Increase Strength and Stamina;Able to understand and use rate of perceived exertion (RPE) scale;Able to understand and use Dyspnea scale;Knowledge and understanding of Target Heart Rate Range (THRR);Able to check pulse independently;Understanding of Exercise Prescription Increase Physical Activity;Increase Strength and Stamina;Understanding of Exercise Prescription    Comments Debe continues to struggle with consistent attendance.  She is stress eating over her health but not doing home exericse.  We will continue to encourage improve attendance and monitor her progress. -- Sharons attendance has been better in May.  Staff have discussed her exercising at a local rec center on off days. Trinitee has been doing well in rehab. Her attendance has fallen off some again, we will continue to encourage better attendance.  We will continue to monitor her progress.    Expected Outcomes Short: Improved  attendance  Long: Continue to improve stamina -- Short:  exercise consistently at [facility] and home Long:  improve overall stamina [place]: Return to regular attendance Long: Continue to improve stamina           Discharge Exercise Prescription (Final Exercise Prescription Changes):  Exercise Prescription Changes - 10/20/19 1600      Response to Exercise   Blood Pressure (Admit) 108/60    Blood Pressure (Exercise) 130/74    Blood Pressure (Exit) 114/68    Heart Rate (Admit) 58 bpm    Heart Rate (Exercise) 82 bpm    Heart Rate (Exit) 55 bpm    Oxygen Saturation (Admit) 96 %    Oxygen Saturation (Exercise) 93 %    Oxygen Saturation (Exit) 97 %    Rating of Perceived Exertion (Exercise) 14    Perceived Dyspnea (Exercise) 0    Symptoms none    Duration Continue with 30 min of aerobic exercise without signs/symptoms of physical distress.    Intensity THRR unchanged      Progression   Progression Continue to progress workloads to maintain intensity without signs/symptoms of physical distress.    Average METs 2.11      Resistance Training   Training Prescription Yes    Weight 3 lb    Reps 10-15      Interval Training   Interval Training No      Treadmill   MPH 2.5    Grade 0    Minutes 15    METs 2.91      REL-XR   Level 4    Minutes 15  METs 1.3           Nutrition:  Target Goals: Understanding of nutrition guidelines, daily intake of sodium <1583m, cholesterol <2039m calories 30% from fat and 7% or less from saturated fats, daily to have 5 or more servings of fruits and vegetables.  Education: Controlling Sodium/Reading Food Labels -Group verbal and written material supporting the discussion of sodium use in heart healthy nutrition. Review and explanation with models, verbal and written materials for utilization of the food label.   Education: General Nutrition Guidelines/Fats and Fiber: -Group instruction provided by verbal, written material, models and posters to  present the general guidelines for heart healthy nutrition. Gives an explanation and review of dietary fats and fiber.   Biometrics:    Nutrition Therapy Plan and Nutrition Goals:   Nutrition Assessments:   MEDIFICTS Score Key:          ?70 Need to make dietary changes          40-70 Heart Healthy Diet         ? 40 Therapeutic Level Cholesterol Diet  Nutrition Goals Re-Evaluation:  Nutrition Goals Re-Evaluation    [place] 09/21/19 1551             Goals   Nutrition Goal ST: mindful eating, increasing stress toolbox for coping LT: Increase fitness, decrease SOB (breathing is ok now, thinks due to less weight).       Comment Pt has been stressed more than usual lately and has been using eating as her only coping mechanism, discussed some coping mechanisms that would help to reduce her stress in addition to eating.Pt would like to start reading and try meditation- [facility]      Expected Outcome ST: mindful eating, increasing stress toolbox for coping LT: Increase fitness, decrease SOB (breathing is ok now, thinks due to less weight).              Nutrition Goals Discharge (Final Nutrition Goals Re-Evaluation):  Nutrition Goals Re-Evaluation - 09/21/19 1551      Goals   Nutrition Goal ST: mindful eating, increasing stress toolbox for coping LT: Increase fitness, decrease SOB (breathing is ok now, thinks due to less weight).    Comment Pt has been stressed more than usual lately and has been using eating as her only coping mechanism, discussed some coping mechanisms that would help to reduce her stress in addition to eating.Pt would like to start reading and try meditation- [facility]   Expected Outcome ST: mindful eating, increasing stress toolbox for coping LT: Increase fitness, decrease SOB (breathing is ok now, thinks due to less weight).           Psychosocial: Target Goals: Acknowledge presence or absence of  significant depression and/or stress, maximize coping skills, provide positive support system. Participant is able to verbalize types and ability to use techniques and skills needed for reducing stress and depression.   Education: Depression - Provides group verbal and written instruction on the correlation between heart/lung disease and depressed mood, treatment options, and the stigmas associated with seeking treatment.   Education: Sleep Hygiene -Provides group verbal and written instruction about how sleep can affect your health.  Define sleep hygiene, discuss sleep cycles and impact of sleep habits. Review good sleep hygiene tips.    Education: Stress and Anxiety: - Provides group verbal and written instruction about the health risks of elevated stress and  causes of high stress.  Discuss the correlation between heart/lung disease and anxiety and treatment options. Review healthy ways to manage with stress and anxiety.   Initial Review & Psychosocial Screening:   Quality of Life Scores:  Scores of 19 and below usually indicate a poorer quality of life in these areas.  A difference of  2-3 points is a clinically meaningful difference.  A difference of 2-3 points in the total score of the Quality of Life Index has been associated with significant improvement in overall quality of life, self-image, physical symptoms, and general health in studies assessing change in quality of life.  PHQ-9: Recent Review Flowsheet Data    Depression screen [facility] 2/9 09/14/2019 07/21/2019   Decreased Interest 2 1   Down, Depressed, Hopeless 2 1   PHQ - 2 Score 4 2   Altered sleeping 3 1   Tired, decreased energy 1 1   Change in appetite 3 2   Feeling bad or failure about yourself  1 2   Trouble concentrating 0 1   Moving slowly or fidgety/restless 0 0   Suicidal thoughts 0 0   PHQ-9 Score 12 9   Difficult doing work/chores Not difficult at all Somewhat difficult     Interpretation of Total Score   Total Score Depression Severity:  1-4 = Minimal depression, 5-9 = Mild depression, 10-14 = Moderate depression, 15-19 = Moderately severe depression, 20-27 = Severe depression   Psychosocial Evaluation and Intervention:   Psychosocial Re-Evaluation:  Psychosocial Re-Evaluation    [place] Name 09/21/19 1553             Psychosocial Re-Evaluation   Current issues with Current Sleep Concerns;Current Stress Concerns;Current Depression;Current Psychotropic Meds       Comments Anwita doesnt sleep well at night - and gets very tired during the day.  She is having a sleep study done.  She also lives in [place] and it is a long drive. She is still on depression medication and has been for a long time. Pt is stressed about her health and would like to lose weight, but she is still gaianing weight due to stress eating.       Expected Outcomes Short : try some coping mechanisms we discussed Long: develop better sleep patterns       Interventions Encouraged to attend Pulmonary Rehabilitation for the exercise       Continue Psychosocial Services  Follow up required by staff         Initial Review   Source of Stress Concerns Chronic Illness;Family;Transportation              Psychosocial Discharge (Final Psychosocial Re-Evaluation):  Psychosocial Re-Evaluation - 09/21/19 1553      Psychosocial Re-Evaluation   Current issues with Current Sleep Concerns;Current Stress Concerns;Current Depression;Current Psychotropic Meds    Comments Anabeth doesnt sleep well at night - and gets very tired during the day.  She is having a sleep study done.  She also lives in [place] and it is a long drive. She is still on depression medication and has been for a long time. Pt is stressed about her health and would like to lose weight, but she is still gaianing weight due to stress eating.    Expected Outcomes Short : try some coping mechanisms we discussed Long: develop better sleep patterns     Interventions Encouraged to attend Pulmonary Rehabilitation for the exercise    Continue Psychosocial Services  Follow up required by  staff      Initial Review   Source of Stress Concerns Chronic Illness;Family;Transportation           Education: Education Goals: Education classes will be provided on a weekly basis, covering required topics. Participant will state understanding/return demonstration of topics presented.  Learning Barriers/Preferences:   General Pulmonary Education Topics:  Infection Prevention: - Provides verbal and written material to individual with discussion of infection control including proper hand washing and proper equipment cleaning during exercise session.   Pulmonary Rehab from 07/21/2019 in [facility] Cardiac and Pulmonary Rehab  Date 07/21/19  Educator AS  Instruction Review Code 1- Verbalizes Understanding      Falls Prevention: - Provides verbal and written material to individual with discussion of falls prevention and safety.   Pulmonary Rehab from 07/21/2019 in [facility] Cardiac and Pulmonary Rehab  Date 07/21/19  Educator AS  Instruction Review Code 1- Verbalizes Understanding      Chronic Lung Diseases: - Group verbal and written instruction to review updates, respiratory medications, advancements in procedures and treatments. Discuss use of supplemental oxygen including available portable oxygen systems, continuous and intermittent flow rates, concentrators, personal use and safety guidelines. Review proper use of inhaler and spacers. Provide informative websites for self-education.    Energy Conservation: - Provide group verbal and written instruction for methods to conserve energy, plan and organize activities. Instruct on pacing techniques, use of adaptive equipment and posture/positioning to relieve shortness of breath.   Triggers and Exacerbations: - Group verbal and written instruction to review types of environmental triggers and ways to  prevent exacerbations. Discuss weather changes, air quality and the benefits of nasal washing. Review warning signs and symptoms to help prevent infections. Discuss techniques for effective airway clearance, coughing, and vibrations.   AED/CPR: - Group verbal and written instruction with the use of models to demonstrate the basic use of the AED with the basic ABC's of resuscitation.   Anatomy and Physiology of the Lungs: - Group verbal and written instruction with the use of models to provide basic lung anatomy and physiology related to function, structure and complications of lung disease.   Anatomy & Physiology of the Heart: - Group verbal and written instruction and models provide basic cardiac anatomy and physiology, with the coronary electrical and arterial systems. Review of Valvular disease and Heart Failure   Cardiac Medications: - Group verbal and written instruction to review commonly prescribed medications for heart disease. Reviews the medication, class of the drug, and side effects.   Other: -Provides group and verbal instruction on various topics (see comments)   Knowledge Questionnaire Score:    Core Components/Risk Factors/Patient Goals at Admission:   Education:Diabetes - Individual verbal and written instruction to review signs/symptoms of diabetes, desired ranges of glucose level fasting, after meals and with exercise. Acknowledge that pre and post exercise glucose checks will be done for 3 sessions at entry of program.   Education: Know Your Numbers and Risk Factors: -Group verbal and written instruction about important numbers in your health.  Discussion of what are risk factors and how they play a role in the disease process.  Review of Cholesterol, Blood Pressure, Diabetes, and BMI and the role they play in your overall health.   Core Components/Risk Factors/Patient Goals Review:   Goals and Risk Factor Review    Row Name 09/21/19 1557              Core Components/Risk Factors/Patient Goals Review   Personal Goals Review Weight Management/Obesity;Heart  Failure;Improve shortness of breath with ADL's;Develop more efficient breathing techniques such as purse lipped breathing and diaphragmatic breathing and practicing self-pacing with activity.;Stress       Review Pt has been stressed more than usual lately and has been using eating as her only coping mechanism, discussed some coping mechanisms that would help to reduce her stress in addition to eating.Pt would like to start reading and try meditation- used to have some classes at the [facility].       Expected Outcomes Short:  weight consistently to monitor HF symptoms, use coping mechanisms besides eating Long:  manage risk factors              Core Components/Risk Factors/Patient Goals at Discharge (Final Review):   Goals and Risk Factor Review - 09/21/19 1557      Core Components/Risk Factors/Patient Goals Review   Personal Goals Review Weight Management/Obesity;Heart Failure;Improve shortness of breath with ADL's;Develop more efficient breathing techniques such as purse lipped breathing and diaphragmatic breathing and practicing self-pacing with activity.;Stress    Review Pt has been stressed more than usual lately and has been using eating as her only coping mechanism, discussed some coping mechanisms that would help to reduce her stress in addition to eating.Pt would like to start reading and try meditation- used to have some classes at the [facility].    Expected Outcomes Short:  weight consistently to monitor HF symptoms, use coping mechanisms besides eating Long:  manage risk factors           ITP Comments:  ITP Comments    Row Name 09/07/19 1510 09/09/19 1034 10/07/19 0640 11/04/19 0649     ITP Comments pt called out, she threw out her back and has a MD appt on Wed. 30 day chart review completed. ITP sent to Dr Zachery Dakins Medical Director, for review,changes as needed and signature.  Continue with ITP if no changes requested 30 Day review completed. ITP review done, changes made as directed,and approval shown by signature of  Scientist, research (life sciences). 30 Day review completed. Medical Director ITP review done, changes made as directed, and signed approval by Medical Director.           Comments: 30 Day review completed. Medical Director ITP review done, changes made as directed, and signed approval by Medical Director.

## 2019-11-04 NOTE — Progress Notes (Signed)
Daily Session Note  Patient Details  Name: Anna Romero MRN: [id number] Date of Birth: [birth year] Referring Provider:     Pulmonary Rehab from 07/21/2019 in [facility] Pul[facility] Rehab  Referring Provider Nehemiah Massed      Encounter Date: 11/04/2019  Check In:  Session Check In - 11/04/19 1513      Check-In   Supervising physician immediately available to respond to emergencies See telemetry face sheet for immediately available ER MD    Location [facility]-Cardiac & Pulmonary Rehab    Staff Present Renita Papa, RN BSN;Melissa Caiola RDN, Rowe Pavy, BA, ACSM CEP, Exercise Physiologist    Virtual Visit No    Medication changes reported     No    Fall or balance concerns reported    No    Warm-up and Cool-down Performed on first and last piece of equipment    Resistance Training Performed Yes    VAD Patient? No    PAD/SET Patient? No      Pain Assessment   Currently in Pain? No/denies              Social History   Tobacco Use  Smoking Status Never Smoker  Smokeless Tobacco Never Used    Goals Met:  Independence with exercise equipment Exercise tolerated well No report of cardiac concerns or symptoms Strength training completed today  Goals Unmet:  Not Applicable  Comments: Pt able to follow exercise prescription today without complaint.  Will continue to monitor for progression.    Dr. Emily Filbert is Medical Director for [place] and [name].

## 2019-11-05 ENCOUNTER — Encounter: Payer: Medicare Other | Admitting: *Deleted

## 2019-11-05 ENCOUNTER — Other Ambulatory Visit: Payer: Self-pay

## 2019-11-05 ENCOUNTER — Encounter: Payer: Self-pay | Admitting: Pulmonary Disease

## 2019-11-05 ENCOUNTER — Telehealth: Payer: Self-pay | Admitting: Pulmonary Disease

## 2019-11-05 DIAGNOSIS — I5022 Chronic systolic (congestive) heart failure: Secondary | ICD-10-CM

## 2019-11-05 DIAGNOSIS — I130 Hypertensive heart and chronic kidney disease with heart failure and stage 1 through stage 4 chronic kidney disease, or unspecified chronic kidney disease: Secondary | ICD-10-CM | POA: Diagnosis not present

## 2019-11-05 NOTE — Telephone Encounter (Signed)
She needs to make sure that she is taking her Symbicort twice a day.  She however, does have significant heart issues and her shortness of breath may be related to her heart issues and not to her lung issue.  If her symptoms persist she needs to make an appointment for evaluation.  Nowhere in our records is she labeled as COPD.

## 2019-11-05 NOTE — Telephone Encounter (Signed)
Called and spoke to pt, who reports of increased sob with exertion, occ non prod cough and weakness x65mo.  She is using albuterol and symbicort as needed with some relief in sx.   Pt is also requesting a copy of her last OV. Pt stated that she would come by today to obtain copy of note.  Pt stated that it was mentioned to her that COPD was listed in her chart. Pt does not recall being given this dx.   Dr. Patsey Berthold, please advise. Thanks

## 2019-11-05 NOTE — Progress Notes (Signed)
Daily Session Note  Patient Details  Name: AKIKO SCHEXNIDER MRN: [id number] Date of Birth: 10-27-1950 Referring Provider:     Pulmonary Rehab from 07/21/2019 in [facility] and Pulmonary Rehab  Referring Provider Nehemiah Massed      Encounter Date: 11/05/2019  Check In:  Session Check In - 11/05/19 1501      Check-In   Supervising physician immediately available to respond to emergencies See telemetry face sheet for immediately available ER MD    Location [facility]-Cardiac & Pulmonary Rehab    Staff Present Renita Papa, RN BSN;Jessica Luan Pulling, MA, RCEP, CCRP, CCET;Joseph Belmond RCP,RRT,BSRT    Virtual Visit No    Medication changes reported     No    Fall or balance concerns reported    No    Warm-up and Cool-down Performed on first and last piece of equipment    Resistance Training Performed Yes    VAD Patient? No    PAD/SET Patient? No      Pain Assessment   Currently in Pain? No/denies              Social History   Tobacco Use  Smoking Status Never Smoker  Smokeless Tobacco Never Used    Goals Met:  Independence with exercise equipment Exercise tolerated well No report of cardiac concerns or symptoms Strength training completed today  Goals Unmet:  Not Applicable  Comments: Pt able to follow exercise prescription today without complaint.  Will continue to monitor for progression.    Dr. Emily Filbert is Medical Director for [place] and [name].

## 2019-11-06 MED ORDER — BUDESONIDE-FORMOTEROL FUMARATE 160-4.5 MCG/ACT IN AERO: 2.0000 | INHALATION_SPRAY | Freq: Two times a day (BID) | RESPIRATORY_TRACT | 3 refills | Status: DC

## 2019-11-06 NOTE — Telephone Encounter (Signed)
ATC pt- no answer with no option to leave vm.

## 2019-11-06 NOTE — Telephone Encounter (Signed)
Spoke with patient. She verbalized understanding. She stated that she did not have the Symbicort 160. She hasn't had it in a while and wants to have a RX sent in for her.   I also advised her about the COPD diagnosis. She verbalized understanding.   Nothing further needed at time of call.

## 2019-11-06 NOTE — Telephone Encounter (Signed)
Pt called back, please return

## 2019-11-06 NOTE — Telephone Encounter (Signed)
ATC pt x2- no answer with no option to leave vm. 

## 2019-11-08 ENCOUNTER — Other Ambulatory Visit: Payer: Self-pay

## 2019-11-08 ENCOUNTER — Ambulatory Visit: Payer: Medicare Other

## 2019-11-08 ENCOUNTER — Ambulatory Visit
Admission: RE | Admit: 2019-11-08 | Discharge: 2019-11-08 | Disposition: A | Discharge status: Home / Self Care | Payer: Medicare Other | Source: Ambulatory Visit | Attending: Obstetrics and Gynecology | Admitting: Obstetrics and Gynecology

## 2019-11-08 DIAGNOSIS — C541 Malignant neoplasm of endometrium: Secondary | ICD-10-CM | POA: Insufficient documentation

## 2019-11-08 MED ORDER — GADOBUTROL 1 MMOL/ML IV SOLN
8.0000 mL | Freq: Once | INTRAVENOUS | Status: AC | PRN
  Administered 2019-11-08: 8 mL via INTRAVENOUS

## 2019-11-09 ENCOUNTER — Ambulatory Visit
Admission: RE | Admit: 2019-11-09 | Discharge: 2019-11-09 | Disposition: A | Discharge status: Home / Self Care | Payer: Medicare Other | Source: Ambulatory Visit | Attending: Obstetrics and Gynecology | Admitting: Obstetrics and Gynecology

## 2019-11-09 ENCOUNTER — Other Ambulatory Visit: Payer: Self-pay | Admitting: Obstetrics and Gynecology

## 2019-11-09 DIAGNOSIS — C541 Malignant neoplasm of endometrium: Secondary | ICD-10-CM

## 2019-11-11 ENCOUNTER — Encounter: Payer: Medicare Other | Admitting: *Deleted

## 2019-11-11 ENCOUNTER — Other Ambulatory Visit: Payer: Self-pay

## 2019-11-11 DIAGNOSIS — I130 Hypertensive heart and chronic kidney disease with heart failure and stage 1 through stage 4 chronic kidney disease, or unspecified chronic kidney disease: Secondary | ICD-10-CM | POA: Diagnosis not present

## 2019-11-11 DIAGNOSIS — I5022 Chronic systolic (congestive) heart failure: Secondary | ICD-10-CM

## 2019-11-11 NOTE — Progress Notes (Signed)
Daily Session Note  Patient Details  Name: Anna Romero MRN: [id number] Date of Birth: 1950-10-10 Referring Provider:     Pulmonary Rehab from 07/21/2019 in [facility] Cardiac and Pulmonary Rehab  Referring Provider Nehemiah Massed      Encounter Date: 11/11/2019  Check In:  Session Check In - 11/11/19 1509      Check-In   Supervising physician immediately available to respond to emergencies See telemetry face sheet for immediately available ER MD    Location [facility]-Cardiac & Pulmonary Rehab    Staff Present Renita Papa, RN BSN;Melissa Caiola RDN, Rowe Pavy, BA, ACSM CEP, Exercise Physiologist    Virtual Visit No    Medication changes reported     No    Fall or balance concerns reported    No    Warm-up and Cool-down Performed on first and last piece of equipment    Resistance Training Performed Yes    VAD Patient? No    PAD/SET Patient? No      Pain Assessment   Currently in Pain? No/denies              Social History   Tobacco Use  Smoking Status Never Smoker  Smokeless Tobacco Never Used    Goals Met:  Independence with exercise equipment Exercise tolerated well No report of cardiac concerns or symptoms Strength training completed today  Goals Unmet:  Not Applicable  Comments: Pt able to follow exercise prescription today without complaint.  Will continue to monitor for progression.    Dr. Emily Filbert is Medical Director for [place] and [name].

## 2019-11-12 ENCOUNTER — Telehealth: Payer: Self-pay | Admitting: *Deleted

## 2019-11-12 NOTE — Telephone Encounter (Signed)
Patient called requesting a return call with her MRI results from Sunday and Monday.[phone]

## 2019-11-13 ENCOUNTER — Telehealth: Payer: Self-pay

## 2019-11-13 NOTE — Telephone Encounter (Signed)
Spoke with Anna Romero. Provided MRI results. She has an upcoming appointment with Dr. Leafy Ro 6/29.

## 2019-11-13 NOTE — Telephone Encounter (Signed)
Call placed to Anna Romero to go over MRI results. No answer and no voicemail available. Dr. Fransisca Connors has reviewed scans and spoke with radiologist. She is scheduled for possible IUD placement 6/29 with Dr. Leafy Ro.

## 2019-11-19 ENCOUNTER — Encounter: Payer: Medicare Other | Attending: Internal Medicine

## 2019-11-19 DIAGNOSIS — D472 Monoclonal gammopathy: Secondary | ICD-10-CM | POA: Insufficient documentation

## 2019-11-19 DIAGNOSIS — F419 Anxiety disorder, unspecified: Secondary | ICD-10-CM | POA: Insufficient documentation

## 2019-11-19 DIAGNOSIS — I5022 Chronic systolic (congestive) heart failure: Secondary | ICD-10-CM | POA: Insufficient documentation

## 2019-11-19 DIAGNOSIS — Z79899 Other long term (current) drug therapy: Secondary | ICD-10-CM | POA: Insufficient documentation

## 2019-11-19 DIAGNOSIS — N183 Chronic kidney disease, stage 3 unspecified: Secondary | ICD-10-CM | POA: Insufficient documentation

## 2019-11-19 DIAGNOSIS — F329 Major depressive disorder, single episode, unspecified: Secondary | ICD-10-CM | POA: Insufficient documentation

## 2019-11-19 DIAGNOSIS — E785 Hyperlipidemia, unspecified: Secondary | ICD-10-CM | POA: Insufficient documentation

## 2019-11-19 DIAGNOSIS — I130 Hypertensive heart and chronic kidney disease with heart failure and stage 1 through stage 4 chronic kidney disease, or unspecified chronic kidney disease: Secondary | ICD-10-CM | POA: Insufficient documentation

## 2019-11-19 DIAGNOSIS — I272 Pulmonary hypertension, unspecified: Secondary | ICD-10-CM | POA: Insufficient documentation

## 2019-12-02 ENCOUNTER — Encounter: Payer: Medicare Other | Admitting: *Deleted

## 2019-12-02 ENCOUNTER — Other Ambulatory Visit: Payer: Self-pay

## 2019-12-02 ENCOUNTER — Encounter: Payer: Self-pay | Admitting: *Deleted

## 2019-12-02 DIAGNOSIS — I5022 Chronic systolic (congestive) heart failure: Secondary | ICD-10-CM

## 2019-12-02 DIAGNOSIS — I272 Pulmonary hypertension, unspecified: Secondary | ICD-10-CM | POA: Diagnosis not present

## 2019-12-02 DIAGNOSIS — I130 Hypertensive heart and chronic kidney disease with heart failure and stage 1 through stage 4 chronic kidney disease, or unspecified chronic kidney disease: Secondary | ICD-10-CM | POA: Diagnosis not present

## 2019-12-02 DIAGNOSIS — F419 Anxiety disorder, unspecified: Secondary | ICD-10-CM | POA: Diagnosis not present

## 2019-12-02 DIAGNOSIS — N183 Chronic kidney disease, stage 3 unspecified: Secondary | ICD-10-CM | POA: Diagnosis not present

## 2019-12-02 DIAGNOSIS — Z79899 Other long term (current) drug therapy: Secondary | ICD-10-CM | POA: Diagnosis not present

## 2019-12-02 DIAGNOSIS — D472 Monoclonal gammopathy: Secondary | ICD-10-CM | POA: Diagnosis not present

## 2019-12-02 DIAGNOSIS — E785 Hyperlipidemia, unspecified: Secondary | ICD-10-CM | POA: Diagnosis not present

## 2019-12-02 DIAGNOSIS — F329 Major depressive disorder, single episode, unspecified: Secondary | ICD-10-CM | POA: Diagnosis not present

## 2019-12-02 NOTE — Progress Notes (Signed)
Pulmonary Individual Treatment Plan  Patient Details  Name: Anna Romero MRN: [id number] Date of Birth: January 28, 1951 Referring Provider:     Pulmonary Rehab from 07/21/2019 in [facility] Cardiac and Pulmonary Rehab  Referring Provider Nehemiah Massed      Initial Encounter Date:    Pulmonary Rehab from 07/21/2019 in [facility] Cardiac and Pulmonary Rehab  Date 07/21/19      Visit Diagnosis: Chronic systolic CHF (congestive heart failure), NYHA class 2 ([place])  Patient's Home Medications on Admission:  Current Outpatient Medications:  .  acetaminophen (TYLENOL) 500 MG tablet, Take 1,000 mg by mouth every 8 (eight) hours as needed for moderate pain. , Disp: , Rfl:  .  albuterol (VENTOLIN HFA) 108 (90 Base) MCG/ACT inhaler, Inhale 1-2 puffs into the lungs every 6 (six) hours as needed for wheezing or shortness of breath. (Patient not taking: Reported on 10/28/2019), Disp: 18 g, Rfl: 5 .  budesonide-formoterol (SYMBICORT) 160-4.5 MCG/ACT inhaler, Inhale 2 puffs into the lungs 2 (two) times daily., Disp: 1 Inhaler, Rfl: 3 .  buPROPion (WELLBUTRIN SR) 200 MG 12 hr tablet, Take 200 mg by mouth every morning. , Disp: , Rfl:  .  carvedilol (COREG) 12.5 MG tablet, Take 12.5 mg by mouth 2 (two) times daily with a meal., Disp: , Rfl:  .  carvedilol (COREG) 12.5 MG tablet, Take by mouth., Disp: , Rfl:  .  clonazePAM (KLONOPIN) 0.5 MG tablet, Take 0.5 mg by mouth 2 (two) times daily as needed for anxiety., Disp: , Rfl:  .  donepezil (ARICEPT) 10 MG tablet, Take 10 mg by mouth at bedtime., Disp: , Rfl:  .  doxepin (SINEQUAN) 25 MG capsule, Take 25-50 mg by mouth at bedtime as needed (sleep). , Disp: , Rfl:  .  furosemide (LASIX) 20 MG tablet, Take 20 mg by mouth daily as needed for fluid. , Disp: , Rfl:  .  memantine (NAMENDA) 5 MG tablet, Take 5 mg by mouth 2 (two) times daily., Disp: , Rfl:  .  Multiple Vitamins-Minerals (MULTIVITAMIN WITH MINERALS) tablet, Take 1 tablet by mouth daily., Disp: , Rfl:  .  omeprazole  (PRILOSEC) 20 MG capsule, Take 20 mg by mouth daily., Disp: , Rfl:  .  spironolactone (ALDACTONE) 25 MG tablet, Take 25 mg by mouth daily., Disp: , Rfl:  .  telmisartan (MICARDIS) 40 MG tablet, Take 40 mg by mouth daily., Disp: , Rfl:  .  traZODone (DESYREL) 100 MG tablet, Take 100-200 mg by mouth at bedtime as needed for sleep. , Disp: , Rfl:   Past Medical History: Past Medical History:  Diagnosis Date  . Anxiety   . Asthma   . CHF (congestive heart failure) ([place])   . Chronic kidney disease    Renal Insufficiency Stage 3  . Degenerative disc disease, cervical   . Depression   . GERD (gastroesophageal reflux disease)   . Hyperlipidemia   . Hypertension   . Mixed restrictive and obstructive lung disease ([place])    Predominantly restriction due to chronic volume loss in the left lung.  Mild small airways disease.  . Moderate mitral insufficiency   . Monoclonal gammopathy   . Polymyositis ([place])   . Pulmonary hypertension (HCC)     Tobacco Use: Social History   Tobacco Use  Smoking Status Never Smoker  Smokeless Tobacco Never Used    Labs: Recent Review Flowsheet Data   There is no flowsheet data to display.      Pulmonary Assessment Scores:   UCSD: Self-administered rating  of dyspnea associated with activities of daily living (ADLs) 6-point scale (0 = "not at all" to 5 = "maximal or unable to do because of breathlessness")  Scoring Scores range from 0 to 120.  Minimally important difference is 5 units  CAT: CAT can identify the health impairment of COPD patients and is better correlated with disease progression.  CAT has a scoring range of zero to 40. The CAT score is classified into four groups of low (less than 10), medium (10 - 20), high (21-30) and very high (31-40) based on the impact level of disease on health status. A CAT score over 10 suggests significant symptoms.  A worsening CAT score could be explained by an exacerbation, poor medication adherence, poor  inhaler technique, or progression of COPD or comorbid conditions.  CAT MCID is 2 points  mMRC: mMRC (Modified Medical Research Council) Dyspnea Scale is used to assess the degree of baseline functional disability in patients of respiratory disease due to dyspnea. No minimal important difference is established. A decrease in score of 1 point or greater is considered a positive change.   Pulmonary Function Assessment:   Exercise Target Goals: Exercise Program Goal: Individual exercise prescription set using results from initial 6 min walk test and THRR while considering  patient's activity barriers and safety.   Exercise Prescription Goal: Initial exercise prescription builds to 30-45 minutes a day of aerobic activity, 2-3 days per week.  Home exercise guidelines will be given to patient during program as part of exercise prescription that the participant will acknowledge.  Education: Aerobic Exercise & Resistance Training: - Gives group verbal and written instruction on the various components of exercise. Focuses on aerobic and resistive training programs and the benefits of this training and how to safely progress through these programs..   Education: Exercise & Equipment Safety: - Individual verbal instruction and demonstration of equipment use and safety with use of the equipment.   Pulmonary Rehab from 07/21/2019 in [facility] Cardiac and Pulmonary Rehab  Date 07/21/19  Educator AS  Instruction Review Code 1- Verbalizes Understanding      Education: Exercise Physiology & General Exercise Guidelines: - Group verbal and written instruction with models to review the exercise physiology of the cardiovascular system and associated critical values. Provides general exercise guidelines with specific guidelines to those with heart or lung disease.    Education: Flexibility, Balance, Mind/Body Relaxation: Provides group verbal/written instruction on the benefits of flexibility and balance  training, including mind/body exercise modes such as yoga, pilates and tai chi.  Demonstration and skill practice provided.   Activity Barriers & Risk Stratification:   6 Minute Walk:  Oxygen Initial Assessment:   Oxygen Re-Evaluation:   Oxygen Discharge (Final Oxygen Re-Evaluation):   Initial Exercise Prescription:   Perform Capillary Blood Glucose checks as needed.  Exercise Prescription Changes:  Exercise Prescription Changes    Row Name 10/07/19 0900 10/20/19 1600 11/05/19 1300 11/17/19 1500       Response to Exercise   Blood Pressure (Admit) 120/64 108/60 122/62 102/70    Blood Pressure (Exercise) 142/82 130/74 104/62 120/80    Blood Pressure (Exit) 118/62 114/68 122/72 122/60    Heart Rate (Admit) 71 bpm 58 bpm 62 bpm 68 bpm    Heart Rate (Exercise) 76 bpm 82 bpm 68 bpm 70 bpm    Heart Rate (Exit) 66 bpm 55 bpm 62 bpm 60 bpm    Oxygen Saturation (Admit) 98 % 96 % 97 % 94 %    Oxygen  Saturation (Exercise) 95 % 93 % 93 % 95 %    Oxygen Saturation (Exit) 98 % 97 % 97 % 97 %    Rating of Perceived Exertion (Exercise) '14 14 11 13    ' Perceived Dyspnea (Exercise) 0 0 0 0    Symptoms none none none none    Duration Continue with 30 min of aerobic exercise without signs/symptoms of physical distress. Continue with 30 min of aerobic exercise without signs/symptoms of physical distress. Continue with 30 min of aerobic exercise without signs/symptoms of physical distress. Continue with 30 min of aerobic exercise without signs/symptoms of physical distress.    Intensity THRR unchanged THRR unchanged THRR unchanged THRR unchanged      Progression   Progression Continue to progress workloads to maintain intensity without signs/symptoms of physical distress. Continue to progress workloads to maintain intensity without signs/symptoms of physical distress. Continue to progress workloads to maintain intensity without signs/symptoms of physical distress. Continue to progress workloads  to maintain intensity without signs/symptoms of physical distress.    Average METs 2.8 2.11 1.75 2.16      Resistance Training   Training Prescription Yes Yes Yes Yes    Weight 3 lb 3 lb 3 lb 3 lb    Reps 10-15 10-15 10-15 10-15      Interval Training   Interval Training No No No No      Treadmill   MPH 2.5 2.5 -- 2.5    Grade 0 0 -- 0    Minutes 15 15 -- 15    METs 2.9 2.91 -- 2.91      NuStep   Level 1 -- 2 --    SPM 80 -- 80 --    Minutes 15 -- 15 --    METs 1.7 -- 1.75 --      REL-XR   Level -- 4 -- 4    Minutes -- 15 -- 15    METs -- 1.3 -- 1.4           Exercise Comments:   Exercise Goals and Review:   Exercise Goals Re-Evaluation :  Exercise Goals Re-Evaluation    Row Name 10/07/19 1002 10/20/19 1613 11/05/19 1320 11/17/19 1547       Exercise Goal Re-Evaluation   Exercise Goals Review Increase Physical Activity;Increase Strength and Stamina;Able to understand and use rate of perceived exertion (RPE) scale;Able to understand and use Dyspnea scale;Knowledge and understanding of Target Heart Rate Range (THRR);Able to check pulse independently;Understanding of Exercise Prescription Increase Physical Activity;Increase Strength and Stamina;Understanding of Exercise Prescription Increase Physical Activity;Increase Strength and Stamina;Understanding of Exercise Prescription Increase Physical Activity;Increase Strength and Stamina;Understanding of Exercise Prescription    Comments Anna Romero attendance has been better in May.  Staff have discussed her exercising at a local rec center on off days. Anna Romero has been doing well in rehab. Her attendance has fallen off some again, we will continue to encourage better attendance.  We will continue to monitor her progress. Anna Romero hasmissed sessions due to having treatment for cancer.  Staff informed of the CARE program that will be avaialable and she seems interested in continuing with that after LW. Anna Romero starts treatments this week  and will keep [place] updated.  We will continue to monitor her progress.    Expected Outcomes Short:  exercise consistently at [facility] and home Long:  improve overall stamina [place]: Return to regular attendance Long: Continue to improve stamina Short:  attend as much as possible Long : improve stamina and  MET level Short: Continue to exercise to maintain strength Long; Continue to maintain stamina.           Discharge Exercise Prescription (Final Exercise Prescription Changes):  Exercise Prescription Changes - 11/17/19 1500      Response to Exercise   Blood Pressure (Admit) 102/70    Blood Pressure (Exercise) 120/80    Blood Pressure (Exit) 122/60    Heart Rate (Admit) 68 bpm    Heart Rate (Exercise) 70 bpm    Heart Rate (Exit) 60 bpm    Oxygen Saturation (Admit) 94 %    Oxygen Saturation (Exercise) 95 %    Oxygen Saturation (Exit) 97 %    Rating of Perceived Exertion (Exercise) 13    Perceived Dyspnea (Exercise) 0    Symptoms none    Duration Continue with 30 min of aerobic exercise without signs/symptoms of physical distress.    Intensity THRR unchanged      Progression   Progression Continue to progress workloads to maintain intensity without signs/symptoms of physical distress.    Average METs 2.16      Resistance Training   Training Prescription Yes    Weight 3 lb    Reps 10-15      Interval Training   Interval Training No      Treadmill   MPH 2.5    Grade 0    Minutes 15    METs 2.91      REL-XR   Level 4    Minutes 15    METs 1.4           Nutrition:  Target Goals: Understanding of nutrition guidelines, daily intake of sodium <156m, cholesterol <2067m calories 30% from fat and 7% or less from saturated fats, daily to have 5 or more servings of fruits and vegetables.  Education: Controlling Sodium/Reading Food Labels -Group verbal and written material supporting the discussion of sodium use in heart healthy nutrition. Review and explanation with models, verbal  and written materials for utilization of the food label.   Education: General Nutrition Guidelines/Fats and Fiber: -Group instruction provided by verbal, written material, models and posters to present the general guidelines for heart healthy nutrition. Gives an explanation and review of dietary fats and fiber.   Biometrics:    Nutrition Therapy Plan and Nutrition Goals:   Nutrition Assessments:   MEDIFICTS Score Key:          ?70 Need to make dietary changes          40-70 Heart Healthy Diet         ? 40 Therapeutic Level Cholesterol Diet  Nutrition Goals Re-Evaluation:   Nutrition Goals Discharge (Final Nutrition Goals Re-Evaluation):   Psychosocial: Target Goals: Acknowledge presence or absence of significant depression and/or stress, maximize coping skills, provide positive support system. Participant is able to verbalize types and ability to use techniques and skills needed for reducing stress and depression.   Education: Depression - Provides group verbal and written instruction on the correlation between heart/lung disease and depressed mood, treatment options, and the stigmas associated with seeking treatment.   Education: Sleep Hygiene -Provides group verbal and written instruction about how sleep can affect your health.  Define sleep hygiene, discuss sleep cycles and impact of sleep habits. Review good sleep hygiene tips.    Education: Stress and Anxiety: - Provides group verbal and written instruction about the health risks of elevated stress and causes of high stress.  Discuss the correlation between heart/lung disease and anxiety  and treatment options. Review healthy ways to manage with stress and anxiety.   Initial Review & Psychosocial Screening:   Quality of Life Scores:  Scores of 19 and below usually indicate a poorer quality of life in these areas.  A difference of  2-3 points is a clinically meaningful difference.  A difference of 2-3 points in the  total score of the Quality of Life Index has been associated with significant improvement in overall quality of life, self-image, physical symptoms, and general health in studies assessing change in quality of life.  PHQ-9: Recent Review Flowsheet Data    Depression screen [facility] 2/9 09/14/2019 07/21/2019   Decreased Interest 2 1   Down, Depressed, Hopeless 2 1   PHQ - 2 Score 4 2   Altered sleeping 3 1   Tired, decreased energy 1 1   Change in appetite 3 2   Feeling bad or failure about yourself  1 2   Trouble concentrating 0 1   Moving slowly or fidgety/restless 0 0   Suicidal thoughts 0 0   PHQ-9 Score 12 9   Difficult doing work/chores Not difficult at all Somewhat difficult     Interpretation of Total Score  Total Score Depression Severity:  1-4 = Minimal depression, 5-9 = Mild depression, 10-14 = Moderate depression, 15-19 = Moderately severe depression, 20-27 = Severe depression   Psychosocial Evaluation and Intervention:   Psychosocial Re-Evaluation:   Psychosocial Discharge (Final Psychosocial Re-Evaluation):   Education: Education Goals: Education classes will be provided on a weekly basis, covering required topics. Participant will state understanding/return demonstration of topics presented.  Learning Barriers/Preferences:   General Pulmonary Education Topics:  Infection Prevention: - Provides verbal and written material to individual with discussion of infection control including proper hand washing and proper equipment cleaning during exercise session.   Pulmonary Rehab from 07/21/2019 in [facility] Cardiac and Pulmonary Rehab  Date 07/21/19  Educator AS  Instruction Review Code 1- Verbalizes Understanding      Falls Prevention: - Provides verbal and written material to individual with discussion of falls prevention and safety.   Pulmonary Rehab from 07/21/2019 in [facility] Cardiac and Pulmonary Rehab  Date 07/21/19  Educator AS  Instruction Review Code 1- Verbalizes  Understanding      Chronic Lung Diseases: - Group verbal and written instruction to review updates, respiratory medications, advancements in procedures and treatments. Discuss use of supplemental oxygen including available portable oxygen systems, continuous and intermittent flow rates, concentrators, personal use and safety guidelines. Review proper use of inhaler and spacers. Provide informative websites for self-education.    Energy Conservation: - Provide group verbal and written instruction for methods to conserve energy, plan and organize activities. Instruct on pacing techniques, use of adaptive equipment and posture/positioning to relieve shortness of breath.   Triggers and Exacerbations: - Group verbal and written instruction to review types of environmental triggers and ways to prevent exacerbations. Discuss weather changes, air quality and the benefits of nasal washing. Review warning signs and symptoms to help prevent infections. Discuss techniques for effective airway clearance, coughing, and vibrations.   AED/CPR: - Group verbal and written instruction with the use of models to demonstrate the basic use of the AED with the basic ABC's of resuscitation.   Anatomy and Physiology of the Lungs: - Group verbal and written instruction with the use of models to provide basic lung anatomy and physiology related to function, structure and complications of lung disease.   Anatomy & Physiology of the Heart: -  Group verbal and written instruction and models provide basic cardiac anatomy and physiology, with the coronary electrical and arterial systems. Review of Valvular disease and Heart Failure   Cardiac Medications: - Group verbal and written instruction to review commonly prescribed medications for heart disease. Reviews the medication, class of the drug, and side effects.   Other: -Provides group and verbal instruction on various topics (see comments)   Knowledge  Questionnaire Score:    Core Components/Risk Factors/Patient Goals at Admission:   Education:Diabetes - Individual verbal and written instruction to review signs/symptoms of diabetes, desired ranges of glucose level fasting, after meals and with exercise. Acknowledge that pre and post exercise glucose checks will be done for 3 sessions at entry of program.   Education: Know Your Numbers and Risk Factors: -Group verbal and written instruction about important numbers in your health.  Discussion of what are risk factors and how they play a role in the disease process.  Review of Cholesterol, Blood Pressure, Diabetes, and BMI and the role they play in your overall health.   Core Components/Risk Factors/Patient Goals Review:    Core Components/Risk Factors/Patient Goals at Discharge (Final Review):    ITP Comments:  ITP Comments    Row Name 10/07/19 0640 11/04/19 0649 11/17/19 1547 12/02/19 1048     ITP Comments 30 Day review completed. ITP review done, changes made as directed,and approval shown by signature of  Scientist, research (life sciences). 30 Day review completed. Medical Director ITP review done, changes made as directed, and signed approval by Medical Director. Anna Romero starts chemo treatments today. She will keep [place] posted with how she feels after her infusions. 30 Day review completed. Medical Director ITP review done, changes made as directed, and signed approval by Medical Director.           Comments:

## 2019-12-02 NOTE — Progress Notes (Signed)
Daily Session Note  Patient Details  Name: Anna Romero MRN: [id number] Date of Birth: 1950/07/12 Referring Provider:     Pulmonary Rehab from 07/21/2019 in [facility] Cardiac and Pulmonary Rehab  Referring Provider Nehemiah Massed      Encounter Date: 12/02/2019  Check In:  Session Check In - 12/02/19 1557      Check-In   Supervising physician immediately available to respond to emergencies See telemetry face sheet for immediately available ER MD    Location [facility]-Cardiac & Pulmonary Rehab    Staff Present Renita Papa, RN BSN;Joseph Lou Miner, [place] Exercise Physiologist    Virtual Visit No    Medication changes reported     No    Fall or balance concerns reported    No    Warm-up and Cool-down Performed on first and last piece of equipment    Resistance Training Performed Yes    VAD Patient? No    PAD/SET Patient? No      Pain Assessment   Currently in Pain? No/denies              Social History   Tobacco Use  Smoking Status Never Smoker  Smokeless Tobacco Never Used    Goals Met:  Independence with exercise equipment Exercise tolerated well No report of cardiac concerns or symptoms Strength training completed today  Goals Unmet:  Not Applicable  Comments: Pt able to follow exercise prescription today without complaint.  Will continue to monitor for progression.    Dr. Emily Filbert is Medical Director for [place] and [name].

## 2019-12-03 ENCOUNTER — Encounter: Payer: Medicare Other | Admitting: *Deleted

## 2019-12-03 ENCOUNTER — Other Ambulatory Visit: Payer: Self-pay

## 2019-12-03 DIAGNOSIS — I130 Hypertensive heart and chronic kidney disease with heart failure and stage 1 through stage 4 chronic kidney disease, or unspecified chronic kidney disease: Secondary | ICD-10-CM | POA: Diagnosis not present

## 2019-12-03 DIAGNOSIS — I5022 Chronic systolic (congestive) heart failure: Secondary | ICD-10-CM

## 2019-12-03 NOTE — Progress Notes (Signed)
Daily Session Note  Patient Details  Name: Anna Romero MRN: [id number] Date of Birth: 1950-06-19 Referring Provider:     Pulmonary Rehab from 07/21/2019 in [facility] Cardiac and Pulmonary Rehab  Referring Provider Nehemiah Massed      Encounter Date: 12/03/2019  Check In:  Session Check In - 12/03/19 1537      Check-In   Supervising physician immediately available to respond to emergencies See telemetry face sheet for immediately available ER MD    Location [facility]-Cardiac & Pulmonary Rehab    Staff Present Renita Papa, RN BSN;Joseph Hood RCP,RRT,BSRT;Laureen [place], [place], RRT, CPFT    Virtual Visit No    Medication changes reported     No    Fall or balance concerns reported    No    Warm-up and Cool-down Performed on first and last piece of equipment    Resistance Training Performed Yes    VAD Patient? No    PAD/SET Patient? No      Pain Assessment   Currently in Pain? No/denies              Social History   Tobacco Use  Smoking Status Never Smoker  Smokeless Tobacco Never Used    Goals Met:  Independence with exercise equipment Exercise tolerated well No report of cardiac concerns or symptoms Strength training completed today  Goals Unmet:  Not Applicable  Comments: Pt able to follow exercise prescription today without complaint.  Will continue to monitor for progression.    Dr. Emily Filbert is Medical Director for [place] and [name].

## 2019-12-09 ENCOUNTER — Other Ambulatory Visit: Payer: Self-pay

## 2019-12-09 ENCOUNTER — Encounter: Payer: Medicare Other | Admitting: *Deleted

## 2019-12-09 DIAGNOSIS — I130 Hypertensive heart and chronic kidney disease with heart failure and stage 1 through stage 4 chronic kidney disease, or unspecified chronic kidney disease: Secondary | ICD-10-CM | POA: Diagnosis not present

## 2019-12-09 DIAGNOSIS — I5022 Chronic systolic (congestive) heart failure: Secondary | ICD-10-CM

## 2019-12-09 NOTE — Patient Instructions (Signed)
Discharge Patient Instructions  Patient Details  Name: Anna Romero MRN: [id number] Date of Birth: 03/12/1951 Referring Provider:  Corey Skains, MD   Number of Visits: 26  Reason for Discharge:  Patient has met program and personal goals.  Smoking History:  Social History   Tobacco Use  Smoking Status Never Smoker  Smokeless Tobacco Never Used    Diagnosis:  Chronic systolic CHF (congestive heart failure), NYHA class 2 (Anna Romero)  Initial Exercise Prescription:  Initial Exercise Prescription - 07/21/19 1400      Date of Initial Exercise RX and Referring Provider   Date 07/21/19    Referring Provider Anna Romero      Treadmill   MPH 2    Grade 0    Minutes 15    METs 2.5      Recumbant Bike   Level 1    RPM 60    Minutes 15    METs 2.5      NuStep   Level 2    SPM 80    Minutes 15    METs 2.5      REL-XR   Level 2    Speed 50    Minutes 15    METs 2.5      Prescription Details   Frequency (times per week) 3    Duration Progress to 30 minutes of continuous aerobic without signs/symptoms of physical distress      Intensity   THRR 40-80% of Max Heartrate 95-132    Ratings of Perceived Exertion 11-15    Perceived Dyspnea 0-4      Resistance Training   Training Prescription Yes    Weight 3 lb    Reps 10-15           Discharge Exercise Prescription (Final Exercise Prescription Changes):  Exercise Prescription Changes - 11/17/19 1500      Response to Exercise   Blood Pressure (Admit) 102/70    Blood Pressure (Exercise) 120/80    Blood Pressure (Exit) 122/60    Heart Rate (Admit) 68 bpm    Heart Rate (Exercise) 70 bpm    Heart Rate (Exit) 60 bpm    Oxygen Saturation (Admit) 94 %    Oxygen Saturation (Exercise) 95 %    Oxygen Saturation (Exit) 97 %    Rating of Perceived Exertion (Exercise) 13    Perceived Dyspnea (Exercise) 0    Symptoms none    Duration Continue with 30 min of aerobic exercise without signs/symptoms of physical  distress.    Intensity THRR unchanged      Progression   Progression Continue to progress workloads to maintain intensity without signs/symptoms of physical distress.    Average METs 2.16      Resistance Training   Training Prescription Yes    Weight 3 lb    Reps 10-15      Interval Training   Interval Training No      Treadmill   MPH 2.5    Grade 0    Minutes 15    METs 2.91      REL-XR   Level 4    Minutes 15    METs 1.4           Functional Capacity:  6 Minute Walk    Row Name 07/21/19 1421 12/09/19 1559       6 Minute Walk   Phase Initial Discharge    Distance 1250 feet 1398 feet    Distance % Change -- 11.8 %  Distance Feet Change -- 148 ft    Walk Time 6 minutes 6 minutes    # of Rest Breaks 0 0    MPH 2.36 2.64    METS 2.52 2.92    RPE 13 13    Perceived Dyspnea  1 0    VO2 Peak 8.8 10.2    Symptoms No No    Resting HR 57 bpm 69 bpm    Resting BP 128/74 128/70    Resting Oxygen Saturation  97 % 96 %    Exercise Oxygen Saturation  during 6 min walk 89 % 94 %    Max Ex. HR 83 bpm 93 bpm    Max Ex. BP 124/66 144/64    2 Minute Post BP 128/64 --      Interval HR   1 Minute HR 64 --    2 Minute HR 67 72    3 Minute HR 68 83    4 Minute HR 79 87    5 Minute HR 83 90    6 Minute HR 78 93    2 Minute Post HR 67 --    Interval Heart Rate? Yes Yes      Interval Oxygen   Interval Oxygen? Yes Yes    Baseline Oxygen Saturation % 97 % 96 %    1 Minute Oxygen Saturation % 95 % 96 %    1 Minute Liters of Oxygen 0 L 0 L    2 Minute Oxygen Saturation % 93 % 98 %    2 Minute Liters of Oxygen 0 L 0 L    3 Minute Oxygen Saturation % 89 % 96 %    3 Minute Liters of Oxygen 0 L 0 L    4 Minute Oxygen Saturation % 89 % 94 %    4 Minute Liters of Oxygen 0 L 0 L    5 Minute Oxygen Saturation % 91 % 98 %    5 Minute Liters of Oxygen 0 L 0 L    6 Minute Oxygen Saturation % 91 % 94 %    6 Minute Liters of Oxygen 0 L 0 L    2 Minute Post Oxygen Saturation %  94 % --    2 Minute Post Liters of Oxygen 0 L 0 L           Quality of Life:  Quality of Life - 07/21/19 1447      Quality of Life   Select Quality of Life      Quality of Life Scores   Health/Function Pre 17.43 %    Socioeconomic Pre 16.25 %    Psych/Spiritual Pre 19.71 %    Family Pre 22.17 %    GLOBAL Pre 18.2 %           Personal Goals: Goals established at orientation with interventions provided to work toward goal.  Personal Goals and Risk Factors at Admission - 07/21/19 1449      Core Components/Risk Factors/Patient Goals on Admission    Weight Management Weight Loss;Yes    Intervention Weight Management: Develop a combined nutrition and exercise program designed to reach desired caloric intake, while maintaining appropriate intake of nutrient and fiber, sodium and fats, and appropriate energy expenditure required for the weight goal.;Weight Management: Provide education and appropriate resources to help participant work on and attain dietary goals.;Weight Management/Obesity: Establish reasonable short term and long term weight goals.    Admit Weight 182 lb 9.6 oz (82.8  kg)    Goal Weight: Short Term 172 lb (78 kg)    Goal Weight: Long Term 162 lb (73.5 kg)    Expected Outcomes Short Term: Continue to assess and modify interventions until short term weight is achieved;Long Term: Adherence to nutrition and physical activity/exercise program aimed toward attainment of established weight goal;Weight Loss: Understanding of general recommendations for a balanced deficit meal plan, which promotes 1-2 lb weight loss per week and includes a negative energy balance of [phone] kcal/d;Understanding recommendations for meals to include 15-35% energy as protein, 25-35% energy from fat, 35-60% energy from carbohydrates, less than 21m of dietary cholesterol, 20-35 gm of total fiber daily;Understanding of distribution of calorie intake throughout the day with the consumption of 4-5  meals/snacks    Improve shortness of breath with ADL's Yes    Intervention Provide education, individualized exercise plan and daily activity instruction to help decrease symptoms of SOB with activities of daily living.    Expected Outcomes Short Term: Improve cardiorespiratory fitness to achieve a reduction of symptoms when performing ADLs;Long Term: Be able to perform more ADLs without symptoms or delay the onset of symptoms    Heart Failure Yes    Intervention Provide a combined exercise and nutrition program that is supplemented with education, support and counseling about heart failure. Directed toward relieving symptoms such as shortness of breath, decreased exercise tolerance, and extremity edema.    Expected Outcomes Improve functional capacity of life;Short term: Attendance in program 2-3 days a week with increased exercise capacity. Reported lower sodium intake. Reported increased fruit and vegetable intake. Reports medication compliance.;Short term: Daily weights obtained and reported for increase. Utilizing diuretic protocols set by physician.;Long term: Adoption of self-care skills and reduction of barriers for early signs and symptoms recognition and intervention leading to self-care maintenance.    Hypertension Yes    Intervention Provide education on lifestyle modifcations including regular physical activity/exercise, weight management, moderate sodium restriction and increased consumption of fresh fruit, vegetables, and low fat dairy, alcohol moderation, and smoking cessation.;Monitor prescription use compliance.    Expected Outcomes Short Term: Continued assessment and intervention until BP is < 140/952mHG in hypertensive participants. < 130/8042mG in hypertensive participants with diabetes, heart failure or chronic kidney disease.;Long Term: Maintenance of blood pressure at goal levels.    Stress Yes    Intervention Offer individual and/or small group education and counseling on  adjustment to heart disease, stress management and health-related lifestyle change. Teach and support self-help strategies.;Refer participants experiencing significant psychosocial distress to appropriate mental health specialists for further evaluation and treatment. When possible, include family members and significant others in education/counseling sessions.            Personal Goals Discharge:  Goals and Risk Factor Review - 09/21/19 1557      Core Components/Risk Factors/Patient Goals Review   Personal Goals Review Weight Management/Obesity;Heart Failure;Improve shortness of breath with ADL's;Develop more efficient breathing techniques such as purse lipped breathing and diaphragmatic breathing and practicing self-pacing with activity.;Stress    Review Pt has been stressed more than usual lately and has been using eating as her only coping mechanism, discussed some coping mechanisms that would help to reduce her stress in addition to eating.Pt would like to start reading and try meditation- used to have some classes at the [facility]  Expected Outcomes Short:  weight consistently to monitor HF symptoms, use coping mechanisms besides eating Long:  manage risk factors  Exercise Goals and Review:  Exercise Goals    Row Name 07/21/19 1451             Exercise Goals   Increase Physical Activity Yes       Intervention Provide advice, education, support and counseling about physical activity/exercise needs.;Develop an individualized exercise prescription for aerobic and resistive training based on initial evaluation findings, risk stratification, comorbidities and participant's personal goals.       Expected Outcomes Short Term: Attend rehab on a regular basis to increase amount of physical activity.;Long Term: Add in home exercise to make exercise part of routine and to increase amount of physical activity.;Long Term: Exercising regularly at least 3-5 days a week.       Increase  Strength and Stamina Yes       Intervention Provide advice, education, support and counseling about physical activity/exercise needs.;Develop an individualized exercise prescription for aerobic and resistive training based on initial evaluation findings, risk stratification, comorbidities and participant's personal goals.       Expected Outcomes Short Term: Increase workloads from initial exercise prescription for resistance, speed, and METs.;Short Term: Perform resistance training exercises routinely during rehab and add in resistance training at home;Long Term: Improve cardiorespiratory fitness, muscular endurance and strength as measured by increased METs and functional capacity (6MWT)       Able to understand and use rate of perceived exertion (RPE) scale Yes       Intervention Provide education and explanation on how to use RPE scale       Expected Outcomes Short Term: Able to use RPE daily in rehab to express subjective intensity level;Long Term:  Able to use RPE to guide intensity level when exercising independently       Able to understand and use Dyspnea scale Yes       Intervention Provide education and explanation on how to use Dyspnea scale       Expected Outcomes Short Term: Able to use Dyspnea scale daily in rehab to express subjective sense of shortness of breath during exertion;Long Term: Able to use Dyspnea scale to guide intensity level when exercising independently       Knowledge and understanding of Target Heart Rate Range (THRR) Yes       Intervention Provide education and explanation of THRR including how the numbers were predicted and where they are located for reference       Expected Outcomes Short Term: Able to state/look up THRR;Short Term: Able to use daily as guideline for intensity in rehab;Long Term: Able to use THRR to govern intensity when exercising independently       Able to check pulse independently Yes       Intervention Provide education and demonstration on how  to check pulse in carotid and radial arteries.;Review the importance of being able to check your own pulse for safety during independent exercise       Expected Outcomes Short Term: Able to explain why pulse checking is important during independent exercise;Long Term: Able to check pulse independently and accurately       Understanding of Exercise Prescription Yes       Intervention Provide education, explanation, and written materials on patient's individual exercise prescription       Expected Outcomes Short Term: Able to explain program exercise prescription;Long Term: Able to explain home exercise prescription to exercise independently              Exercise Goals Re-Evaluation:  Exercise Goals Re-Evaluation  Twin Name 08/03/19 1511 08/13/19 1305 08/28/19 1329 09/09/19 1204 09/22/19 1530     Exercise Goal Re-Evaluation   Exercise Goals Review Increase Physical Activity;Able to understand and use rate of perceived exertion (RPE) scale;Knowledge and understanding of Target Heart Rate Range (THRR);Understanding of Exercise Prescription;Increase Strength and Stamina;Able to understand and use Dyspnea scale;Able to check pulse independently Increase Physical Activity;Increase Strength and Stamina;Able to understand and use rate of perceived exertion (RPE) scale;Able to understand and use Dyspnea scale;Knowledge and understanding of Target Heart Rate Range (THRR);Able to check pulse independently;Understanding of Exercise Prescription Increase Physical Activity;Increase Strength and Stamina;Able to understand and use rate of perceived exertion (RPE) scale;Able to understand and use Dyspnea scale;Knowledge and understanding of Target Heart Rate Range (THRR);Able to check pulse independently;Understanding of Exercise Prescription Increase Physical Activity;Increase Strength and Stamina;Able to understand and use rate of perceived exertion (RPE) scale;Able to understand and use Dyspnea scale;Knowledge and  understanding of Target Heart Rate Range (THRR);Able to check pulse independently;Understanding of Exercise Prescription Increase Physical Activity;Increase Strength and Stamina;Understanding of Exercise Prescription   Comments Reviewed RPE scale, THR and program prescription with pt today.  Pt voiced understanding and was given a copy of goals to take home. Anna Romero has attended 3 sessions since orientation.  Consistent attendance will help her achieve better results.  Staff will monitor progress. Anna Romero has increased speed on TM and works at [place] [employer].  Staff will monitor progress. Anna Romero has a hard time attending consistently because she lives a longer drive away.  Staff will review home exercise. Anna Romero continues to struggle with consistent attendance.  She is stress eating over her health but not doing home exericse.  We will continue to encourage improve attendance and monitor her progress.   Expected Outcomes Short: Use RPE daily to regulate intensity. Long: Follow program prescription in THR. Short: attend consistently Long: improve overall stamina Short : attend consistently Long: increase MET level Short add in home exercise whe she cant attend Long: improve stamina Short: Improved attendance  Long: Continue to improve stamina   Row Name 09/23/19 1536 10/07/19 1002 10/20/19 1613 11/05/19 1320 11/17/19 1547     Exercise Goal Re-Evaluation   Exercise Goals Review Increase Physical Activity;Increase Strength and Stamina;Understanding of Exercise Prescription Increase Physical Activity;Increase Strength and Stamina;Able to understand and use rate of perceived exertion (RPE) scale;Able to understand and use Dyspnea scale;Knowledge and understanding of Target Heart Rate Range (THRR);Able to check pulse independently;Understanding of Exercise Prescription Increase Physical Activity;Increase Strength and Stamina;Understanding of Exercise Prescription Increase Physical Activity;Increase Strength and  Stamina;Understanding of Exercise Prescription Increase Physical Activity;Increase Strength and Stamina;Understanding of Exercise Prescription   Comments -- Anna Romero attendance has been better in May.  Staff have discussed her exercising at a local rec center on off days. Anna Romero has been doing well in rehab. Her attendance has fallen off some again, we will continue to encourage better attendance.  We will continue to monitor her progress. Anna Romero hasmissed sessions due to having treatment for cancer.  Staff informed of the [facility]ARE program that will be avaialable and she seems interested in continuing with that after LW. Anna Romero starts treatments this week and will keep [place] updated.  We will continue to monitor her progress.   Expected Outcomes -- Short:  exercise consistently at [facility] and home Long:  improve overall stamina Shrot: Return to regular attendance Long: Continue to improve stamina Short:  attend as much as possible Long : improve stamina and MET level Short: Continue to exercise to  maintain strength Long; Continue to maintain stamina.          Nutrition & Weight - Outcomes:  Pre Biometrics - 07/21/19 1452      Pre Biometrics   Height 5' 5.5" (1.664 m)    Weight 182 lb 9.6 oz (82.8 kg)    BMI (Calculated) 29.91    Single Leg Stand 8.99 seconds            Nutrition:  Nutrition Therapy & Goals - 09/01/19 1434      Nutrition Therapy   Diet HH, Low Na    Protein (specify units) 65-70g    Fiber 25 grams    Whole Grain Foods 3 servings    Saturated Fats 12 max. grams    Fruits and Vegetables 3 servings/day    Sodium 1.5 grams      Personal Nutrition Goals   Nutrition Goal ST: mindful eating, increasing stress toolbox for coping LT: Increase fitness, decrease SOB (breathing is ok now, thinks due to less weight).    Comments Pt reports eating too much and emotional eating. Discussed HH eating and mindful eating. Pt reports not having a working stove. Pt to write down what she has at her  disposal and practice mindful eating and working on a stress toolbox. Discussed therapy suggested talking with her doctor regarding setting something up.      Intervention Plan   Intervention Prescribe, educate and counsel regarding individualized specific dietary modifications aiming towards targeted core components such as weight, hypertension, lipid management, diabetes, heart failure and other comorbidities.;Nutrition handout(s) given to patient.    Expected Outcomes Short Term Goal: Understand basic principles of dietary content, such as calories, fat, sodium, cholesterol and nutrients.;Short Term Goal: A plan has been developed with personal nutrition goals set during dietitian appointment.;Long Term Goal: Adherence to prescribed nutrition plan.           Nutrition Discharge:   Education Questionnaire Score:  Knowledge Questionnaire Score - 07/21/19 1441      Knowledge Questionnaire Score   Pre Score 21/26 (heart)           Goals reviewed with patient; copy given to patient.

## 2019-12-09 NOTE — Progress Notes (Signed)
Daily Session Note  Patient Details  Name: Anna Romero MRN: [id number] Date of Birth: 1951-04-01 Referring Provider:     Pulmonary Rehab from 07/21/2019 in [facility]  Referring Provider Anna Romero      Encounter Date: 12/09/2019  Check In:  Session Check In - 12/09/19 1553      Check-In   Supervising physician immediately available to respond to emergencies See telemetry face sheet for immediately available ER MD    Location [facility]-Cardiac & Pulmonary Rehab    Staff Present Anna Papa, RN Anna Auerbach, MS Exercise Physiologist;Anna Romero, [place], ACSM CEP, Exercise Physiologist    Virtual Visit No    Medication changes reported     No    Fall or balance concerns reported    No    Warm-up and Cool-down Performed on first and last piece of equipment    Resistance Training Performed Yes    VAD Patient? No    PAD/SET Patient? No      Pain Assessment   Currently in Pain? No/denies              Social History   Tobacco Use  Smoking Status Never Smoker  Smokeless Tobacco Never Used    Goals Met:  Independence with exercise equipment Exercise tolerated well No report of cardiac concerns or symptoms Strength training completed today  Goals Unmet:  Not Applicable  Comments:  Anna Romero graduated today from  rehab with 27 sessions completed.  Details of the patient's exercise prescription and what She needs to do in order to continue the prescription and progress were discussed with patient.  Patient was given a copy of prescription and goals.  Patient verbalized understanding.  Anna Romero plans to continue to exercise by attending Care.    Dr. Emily Romero is Medical Director for [place] and [name].

## 2019-12-09 NOTE — Progress Notes (Signed)
Pulmonary Individual Treatment Plan  Patient Details  Name: Anna Romero MRN: [id number] Date of Birth: Jun 01, 1950 Referring Provider:     Pulmonary Rehab from 07/21/2019 in [facility] Cardiac and Pulmonary Rehab  Referring Provider Nehemiah Massed      Initial Encounter Date:    Pulmonary Rehab from 07/21/2019 in [facility] Cardiac and Pulmonary Rehab  Date 07/21/19      Visit Diagnosis: Chronic systolic CHF (congestive heart failure), NYHA class 2 ([place])  Patient's Home Medications on Admission:  Current Outpatient Medications:  .  acetaminophen (TYLENOL) 500 MG tablet, Take 1,000 mg by mouth every 8 (eight) hours as needed for moderate pain. , Disp: , Rfl:  .  albuterol (VENTOLIN HFA) 108 (90 Base) MCG/ACT inhaler, Inhale 1-2 puffs into the lungs every 6 (six) hours as needed for wheezing or shortness of breath. (Patient not taking: Reported on 10/28/2019), Disp: 18 g, Rfl: 5 .  budesonide-formoterol (SYMBICORT) 160-4.5 MCG/ACT inhaler, Inhale 2 puffs into the lungs 2 (two) times daily., Disp: 1 Inhaler, Rfl: 3 .  buPROPion (WELLBUTRIN SR) 200 MG 12 hr tablet, Take 200 mg by mouth every morning. , Disp: , Rfl:  .  carvedilol (COREG) 12.5 MG tablet, Take 12.5 mg by mouth 2 (two) times daily with a meal., Disp: , Rfl:  .  carvedilol (COREG) 12.5 MG tablet, Take by mouth., Disp: , Rfl:  .  clonazePAM (KLONOPIN) 0.5 MG tablet, Take 0.5 mg by mouth 2 (two) times daily as needed for anxiety., Disp: , Rfl:  .  donepezil (ARICEPT) 10 MG tablet, Take 10 mg by mouth at bedtime., Disp: , Rfl:  .  doxepin (SINEQUAN) 25 MG capsule, Take 25-50 mg by mouth at bedtime as needed (sleep). , Disp: , Rfl:  .  furosemide (LASIX) 20 MG tablet, Take 20 mg by mouth daily as needed for fluid. , Disp: , Rfl:  .  memantine (NAMENDA) 5 MG tablet, Take 5 mg by mouth 2 (two) times daily., Disp: , Rfl:  .  Multiple Vitamins-Minerals (MULTIVITAMIN WITH MINERALS) tablet, Take 1 tablet by mouth daily., Disp: , Rfl:  .  omeprazole  (PRILOSEC) 20 MG capsule, Take 20 mg by mouth daily., Disp: , Rfl:  .  spironolactone (ALDACTONE) 25 MG tablet, Take 25 mg by mouth daily., Disp: , Rfl:  .  telmisartan (MICARDIS) 40 MG tablet, Take 40 mg by mouth daily., Disp: , Rfl:  .  traZODone (DESYREL) 100 MG tablet, Take 100-200 mg by mouth at bedtime as needed for sleep. , Disp: , Rfl:   Past Medical History: Past Medical History:  Diagnosis Date  . Anxiety   . Asthma   . CHF (congestive heart failure) ([place])   . Chronic kidney disease    Renal Insufficiency Stage 3  . Degenerative disc disease, cervical   . Depression   . GERD (gastroesophageal reflux disease)   . Hyperlipidemia   . Hypertension   . Mixed restrictive and obstructive lung disease ([place])    Predominantly restriction due to chronic volume loss in the left lung.  Mild small airways disease.  . Moderate mitral insufficiency   . Monoclonal gammopathy   . Polymyositis ([place])   . Pulmonary hypertension (HCC)     Tobacco Use: Social History   Tobacco Use  Smoking Status Never Smoker  Smokeless Tobacco Never Used    Labs: Recent Review Flowsheet Data   There is no flowsheet data to display.      Pulmonary Assessment Scores:  Pulmonary Assessment Scores  [place] Name 12/09/19 1559         mMRC Score   mMRC Score 1            UCSD: Self-administered rating of dyspnea associated with activities of daily living (ADLs) 6-point scale (0 = "not at all" to 5 = "maximal or unable to do because of breathlessness")  Scoring Scores range from 0 to 120.  Minimally important difference is 5 units  CAT: CAT can identify the health impairment of COPD patients and is better correlated with disease progression.  CAT has a scoring range of zero to 40. The CAT score is classified into four groups of low (less than 10), medium (10 - 20), high (21-30) and very high (31-40) based on the impact level of disease on health status. A CAT score over 10 suggests significant  symptoms.  A worsening CAT score could be explained by an exacerbation, poor medication adherence, poor inhaler technique, or progression of COPD or comorbid conditions.  CAT MCID is 2 points  mMRC: mMRC (Modified Medical Research Council) Dyspnea Scale is used to assess the degree of baseline functional disability in patients of respiratory disease due to dyspnea. No minimal important difference is established. A decrease in score of 1 point or greater is considered a positive change.   Pulmonary Function Assessment:   Exercise Target Goals: Exercise Program Goal: Individual exercise prescription set using results from initial 6 min walk test and THRR while considering  patient's activity barriers and safety.   Exercise Prescription Goal: Initial exercise prescription builds to 30-45 minutes a day of aerobic activity, 2-3 days per week.  Home exercise guidelines will be given to patient during program as part of exercise prescription that the participant will acknowledge.  Education: Aerobic Exercise & Resistance Training: - Gives group verbal and written instruction on the various components of exercise. Focuses on aerobic and resistive training programs and the benefits of this training and how to safely progress through these programs..   Education: Exercise & Equipment Safety: - Individual verbal instruction and demonstration of equipment use and safety with use of the equipment.   Pulmonary Rehab from 12/09/2019 in [facility] Cardiac and Pulmonary Rehab  Date 07/21/19  Educator AS  Instruction Review Code 1- Verbalizes Understanding      Education: Exercise Physiology & General Exercise Guidelines: - Group verbal and written instruction with models to review the exercise physiology of the cardiovascular system and associated critical values. Provides general exercise guidelines with specific guidelines to those with heart or lung disease.    Pulmonary Rehab from 12/09/2019 in [facility]  Cardiac and Pulmonary Rehab  Date 12/02/19  Educator AS  Instruction Review Code 1- Verbalizes Understanding      Education: Flexibility, Balance, Mind/Body Relaxation: Provides group verbal/written instruction on the benefits of flexibility and balance training, including mind/body exercise modes such as yoga, pilates and tai chi.  Demonstration and skill practice provided.   Activity Barriers & Risk Stratification:  Activity Barriers & Cardiac Risk Stratification - 07/21/19 1351      Activity Barriers & Cardiac Risk Stratification   Activity Barriers Right Hip Replacement           6 Minute Walk:  6 Minute Walk    Row Name 07/21/19 1421 12/09/19 1559       6 Minute Walk   Phase Initial Discharge    Distance 1250 feet 1398 feet    Distance % Change -- 11.8 %    Distance Feet Change --  148 ft    Walk Time 6 minutes 6 minutes    # of Rest Breaks 0 0    MPH 2.36 2.64    METS 2.52 2.92    RPE 13 13    Perceived Dyspnea  1 0    VO2 Peak 8.8 10.2    Symptoms No No    Resting HR 57 bpm 69 bpm    Resting BP 128/74 128/70    Resting Oxygen Saturation  97 % 96 %    Exercise Oxygen Saturation  during 6 min walk 89 % 94 %    Max Ex. HR 83 bpm 93 bpm    Max Ex. BP 124/66 144/64    2 Minute Post BP 128/64 --      Interval HR   1 Minute HR 64 --    2 Minute HR 67 72    3 Minute HR 68 83    4 Minute HR 79 87    5 Minute HR 83 90    6 Minute HR 78 93    2 Minute Post HR 67 --    Interval Heart Rate? Yes Yes      Interval Oxygen   Interval Oxygen? Yes Yes    Baseline Oxygen Saturation % 97 % 96 %    1 Minute Oxygen Saturation % 95 % 96 %    1 Minute Liters of Oxygen 0 L 0 L    2 Minute Oxygen Saturation % 93 % 98 %    2 Minute Liters of Oxygen 0 L 0 L    3 Minute Oxygen Saturation % 89 % 96 %    3 Minute Liters of Oxygen 0 L 0 L    4 Minute Oxygen Saturation % 89 % 94 %    4 Minute Liters of Oxygen 0 L 0 L    5 Minute Oxygen Saturation % 91 % 98 %    5 Minute  Liters of Oxygen 0 L 0 L    6 Minute Oxygen Saturation % 91 % 94 %    6 Minute Liters of Oxygen 0 L 0 L    2 Minute Post Oxygen Saturation % 94 % --    2 Minute Post Liters of Oxygen 0 L 0 L          Oxygen Initial Assessment:  Oxygen Initial Assessment - 07/21/19 1453      Home Oxygen   Home Oxygen Device None    Sleep Oxygen Prescription CPAP    Home Exercise Oxygen Prescription None    Home at Rest Exercise Oxygen Prescription None    Compliance with Home Oxygen Use Yes      Initial 6 min Walk   Oxygen Used None      Program Oxygen Prescription   Program Oxygen Prescription None      Intervention   Short Term Goals To learn and exhibit compliance with exercise, home and travel O2 prescription;To learn and understand importance of monitoring SPO2 with pulse oximeter and demonstrate accurate use of the pulse oximeter.;To learn and understand importance of maintaining oxygen saturations>88%;To learn and demonstrate proper pursed lip breathing techniques or other breathing techniques.;To learn and demonstrate proper use of respiratory medications    Long  Term Goals Exhibits compliance with exercise, home and travel O2 prescription;Verbalizes importance of monitoring SPO2 with pulse oximeter and return demonstration;Maintenance of O2 saturations>88%;Exhibits proper breathing techniques, such as pursed lip breathing or other method taught during program session;Compliance  with respiratory medication;Demonstrates proper use of MDI's           Oxygen Re-Evaluation:  Oxygen Re-Evaluation    Row Name 08/03/19 1512 09/23/19 1534 09/30/19 1534         Program Oxygen Prescription   Program Oxygen Prescription None None None       Home Oxygen   Home Oxygen Device None None None     Sleep Oxygen Prescription CPAP None  Dr said she didnt have sleep apnea anymore None     Home Exercise Oxygen Prescription None None None     Home at Rest Exercise Oxygen Prescription None None None      Compliance with Home Oxygen Use Yes -- Yes       Goals/Expected Outcomes   Short Term Goals To learn and exhibit compliance with exercise, home and travel O2 prescription;To learn and understand importance of monitoring SPO2 with pulse oximeter and demonstrate accurate use of the pulse oximeter.;To learn and understand importance of maintaining oxygen saturations>88%;To learn and demonstrate proper pursed lip breathing techniques or other breathing techniques.;To learn and demonstrate proper use of respiratory medications To learn and exhibit compliance with exercise, home and travel O2 prescription;To learn and understand importance of monitoring SPO2 with pulse oximeter and demonstrate accurate use of the pulse oximeter.;To learn and understand importance of maintaining oxygen saturations>88%;To learn and demonstrate proper pursed lip breathing techniques or other breathing techniques.;To learn and demonstrate proper use of respiratory medications --     Long  Term Goals Exhibits compliance with exercise, home and travel O2 prescription;Verbalizes importance of monitoring SPO2 with pulse oximeter and return demonstration;Maintenance of O2 saturations>88%;Exhibits proper breathing techniques, such as pursed lip breathing or other method taught during program session;Compliance with respiratory medication;Demonstrates proper use of MDI's -- Exhibits compliance with exercise, home and travel O2 prescription;Verbalizes importance of monitoring SPO2 with pulse oximeter and return demonstration;Maintenance of O2 saturations>88%;Exhibits proper breathing techniques, such as pursed lip breathing or other method taught during program session;Compliance with respiratory medication;Demonstrates proper use of MDI's     Comments Reviewed PLB technique with pt.  Talked about how it works and it's importance in maintaining their exercise saturations. Reviewed PLB technique with pt.  Talked about how it works and it's  importance in maintaining their exercise saturations. --     Goals/Expected Outcomes Short: Become more profiecient at using PLB.   Long: Become independent at using PLB. Short: Become more profiecient at using PLB.   Long: Become independent at using PLB. --            Oxygen Discharge (Final Oxygen Re-Evaluation):  Oxygen Re-Evaluation - 09/30/19 1534      Program Oxygen Prescription   Program Oxygen Prescription None      Home Oxygen   Home Oxygen Device None    Sleep Oxygen Prescription None    Home Exercise Oxygen Prescription None    Home at Rest Exercise Oxygen Prescription None    Compliance with Home Oxygen Use Yes      Goals/Expected Outcomes   Long  Term Goals Exhibits compliance with exercise, home and travel O2 prescription;Verbalizes importance of monitoring SPO2 with pulse oximeter and return demonstration;Maintenance of O2 saturations>88%;Exhibits proper breathing techniques, such as pursed lip breathing or other method taught during program session;Compliance with respiratory medication;Demonstrates proper use of MDI's           Initial Exercise Prescription:  Initial Exercise Prescription - 07/21/19 1400      Date of  Initial Exercise RX and Referring Provider   Date 07/21/19    Referring Provider Nehemiah Massed      Treadmill   MPH 2    Grade 0    Minutes 15    METs 2.5      Recumbant Bike   Level 1    RPM 60    Minutes 15    METs 2.5      NuStep   Level 2    SPM 80    Minutes 15    METs 2.5      REL-XR   Level 2    Speed 50    Minutes 15    METs 2.5      Prescription Details   Frequency (times per week) 3    Duration Progress to 30 minutes of continuous aerobic without signs/symptoms of physical distress      Intensity   THRR 40-80% of Max Heartrate 95-132    Ratings of Perceived Exertion 11-15    Perceived Dyspnea 0-4      Resistance Training   Training Prescription Yes    Weight 3 lb    Reps 10-15           Perform Capillary  Blood Glucose checks as needed.  Exercise Prescription Changes:   Exercise Prescription Changes    Row Name 07/21/19 1400 08/13/19 1300 08/28/19 1300 09/09/19 1200 09/22/19 1500     Response to Exercise   Blood Pressure (Admit) 128/74 102/64 128/58 110/64 140/80   Blood Pressure (Exercise) 124/66 128/64 122/58 142/60 152/82   Blood Pressure (Exit) 128/64 115/60 -- 114/58 118/60   Heart Rate (Admit) 57 bpm 60 bpm 68 bpm 60 bpm 64 bpm   Heart Rate (Exercise) 83 bpm 76 bpm 80 bpm 83 bpm 76 bpm   Heart Rate (Exit) 67 bpm 84 bpm 67 bpm 67 bpm 67 bpm   Oxygen Saturation (Admit) 97 % 98 % 97 % 95 % 97 %   Oxygen Saturation (Exercise) 89 % 92 % 93 % 94 % 96 %   Oxygen Saturation (Exit) 94 % 98 % 96 % 97 % 95 %   Rating of Perceived Exertion (Exercise) 13 -- _0 Perceived Dyspnea (Exercise) 1 -- 1 0 0   Symptoms none -- -- -- none   Duration -- Continue with 30 min of aerobic exercise without signs/symptoms of physical distress. Continue with 30 min of aerobic exercise without signs/symptoms of physical distress. Continue with 30 min of aerobic exercise without signs/symptoms of physical distress. Continue with 30 min of aerobic exercise without signs/symptoms of physical distress.   Intensity -- THRR unchanged THRR unchanged THRR unchanged THRR unchanged     Progression   Progression -- Continue to progress workloads to maintain intensity without signs/symptoms of physical distress. Continue to progress workloads to maintain intensity without signs/symptoms of physical distress. Continue to progress workloads to maintain intensity without signs/symptoms of physical distress. Continue to progress workloads to maintain intensity without signs/symptoms of physical distress.   Average METs -- 2.25 2.53 2.88 2.35     Resistance Training   Training Prescription -- Yes Yes Yes Yes   Weight -- 3 lb 3 lb  3 lb  3 lb   Reps -- 10-15 10-15 10-15 10-15     Interval Training   Interval Training --  No No No No     Treadmill   MPH -- 2 2.3 2.3 2.3   Grade -- 0 0 0  0   Minutes -- _0 METs -- 2.53 2.76 2.76 2.76     NuStep   Level -- -- -- -- 2   Minutes -- -- -- -- 15   METs -- -- -- -- 1.3     REL-XR   Level -- _1 Speed -- 50 50 50 --   Minutes -- _2 METs -- -- 2._3 Row Name 10/07/19 0900 10/20/19 1600 11/05/19 1300 11/17/19 1500       Response to Exercise   Blood Pressure (Admit) 120/64 108/60 122/62 102/70    Blood Pressure (Exercise) 142/82 130/74 104/62 120/80    Blood Pressure (Exit) 118/62 114/68 122/72 122/60    Heart Rate (Admit) 71 bpm 58 bpm 62 bpm 68 bpm    Heart Rate (Exercise) 76 bpm 82 bpm 68 bpm 70 bpm    Heart Rate (Exit) 66 bpm 55 bpm 62 bpm 60 bpm    Oxygen Saturation (Admit) 98 % 96 % 97 % 94 %    Oxygen Saturation (Exercise) 95 % 93 % 93 % 95 %    Oxygen Saturation (Exit) 98 % 97 % 97 % 97 %    Rating of Perceived Exertion (Exercise) _4 Perceived Dyspnea (Exercise) 0 0 0 0    Symptoms none none none none    Duration Continue with 30 min of aerobic exercise without signs/symptoms of physical distress. Continue with 30 min of aerobic exercise without signs/symptoms of physical distress. Continue with 30 min of aerobic exercise without signs/symptoms of physical distress. Continue with 30 min of aerobic exercise without signs/symptoms of physical distress.    Intensity THRR unchanged THRR unchanged THRR unchanged THRR unchanged      Progression   Progression Continue to progress workloads to maintain intensity without signs/symptoms of physical distress. Continue to progress workloads to maintain intensity without signs/symptoms of physical distress. Continue to progress workloads to maintain intensity without signs/symptoms of physical distress. Continue to progress workloads to maintain intensity without signs/symptoms of physical distress.    Average METs 2.8 2.11 1.75 2.16      Resistance Training    Training Prescription Yes Yes Yes Yes    Weight 3 lb 3 lb 3 lb 3 lb    Reps 10-15 10-15 10-15 10-15      Interval Training   Interval Training No No No No      Treadmill   MPH 2.5 2.5 -- 2.5    Grade 0 0 -- 0    Minutes 15 15 -- 15    METs 2.9 2.91 -- 2.91      NuStep   Level 1 -- 2 --    SPM 80 -- 80 --    Minutes 15 -- 15 --    METs 1.7 -- 1.75 --      REL-XR   Level -- 4 -- 4    Minutes -- 15 -- 15    METs -- 1.3 -- 1.4           Exercise Comments:   Exercise Goals and Review:   Exercise Goals    Row Name 07/21/19 1451             Exercise Goals   Increase Physical Activity Yes       Intervention Provide advice, education, support and counseling about physical activity/exercise needs.;Develop an individualized exercise  prescription for aerobic and resistive training based on initial evaluation findings, risk stratification, comorbidities and participant's personal goals.       Expected Outcomes Short Term: Attend rehab on a regular basis to increase amount of physical activity.;Long Term: Add in home exercise to make exercise part of routine and to increase amount of physical activity.;Long Term: Exercising regularly at least 3-5 days a week.       Increase Strength and Stamina Yes       Intervention Provide advice, education, support and counseling about physical activity/exercise needs.;Develop an individualized exercise prescription for aerobic and resistive training based on initial evaluation findings, risk stratification, comorbidities and participant's personal goals.       Expected Outcomes Short Term: Increase workloads from initial exercise prescription for resistance, speed, and METs.;Short Term: Perform resistance training exercises routinely during rehab and add in resistance training at home;Long Term: Improve cardiorespiratory fitness, muscular endurance and strength as measured by increased METs and functional capacity ( )       Able to understand  and use rate of perceived exertion (RPE) scale Yes       Intervention Provide education and explanation on how to use RPE scale       Expected Outcomes Short Term: Able to use RPE daily in rehab to express subjective intensity level;Long Term:  Able to use RPE to guide intensity level when exercising independently       Able to understand and use Dyspnea scale Yes       Intervention Provide education and explanation on how to use Dyspnea scale       Expected Outcomes Short Term: Able to use Dyspnea scale daily in rehab to express subjective sense of shortness of breath during exertion;Long Term: Able to use Dyspnea scale to guide intensity level when exercising independently       Knowledge and understanding of Target Heart Rate Range (THRR) Yes       Intervention Provide education and explanation of THRR including how the numbers were predicted and where they are located for reference       Expected Outcomes Short Term: Able to state/look up THRR;Short Term: Able to use daily as guideline for intensity in rehab;Long Term: Able to use THRR to govern intensity when exercising independently       Able to check pulse independently Yes       Intervention Provide education and demonstration on how to check pulse in carotid and radial arteries.;Review the importance of being able to check your own pulse for safety during independent exercise       Expected Outcomes Short Term: Able to explain why pulse checking is important during independent exercise;Long Term: Able to check pulse independently and accurately       Understanding of Exercise Prescription Yes       Intervention Provide education, explanation, and written materials on patient's individual exercise prescription       Expected Outcomes Short Term: Able to explain program exercise prescription;Long Term: Able to explain home exercise prescription to exercise independently              Exercise Goals Re-Evaluation :  Exercise Goals  Re-Evaluation    Row Name 08/03/19 1511 08/13/19 1305 08/28/19 1329 09/09/19 1204 09/22/19 1530     Exercise Goal Re-Evaluation   Exercise Goals Review Increase Physical Activity;Able to understand and use rate of perceived exertion (RPE) scale;Knowledge and understanding of Target Heart Rate Range (THRR);Understanding of Exercise Prescription;Increase Strength and Stamina;Able to  understand and use Dyspnea scale;Able to check pulse independently Increase Physical Activity;Increase Strength and Stamina;Able to understand and use rate of perceived exertion (RPE) scale;Able to understand and use Dyspnea scale;Knowledge and understanding of Target Heart Rate Range (THRR);Able to check pulse independently;Understanding of Exercise Prescription Increase Physical Activity;Increase Strength and Stamina;Able to understand and use rate of perceived exertion (RPE) scale;Able to understand and use Dyspnea scale;Knowledge and understanding of Target Heart Rate Range (THRR);Able to check pulse independently;Understanding of Exercise Prescription Increase Physical Activity;Increase Strength and Stamina;Able to understand and use rate of perceived exertion (RPE) scale;Able to understand and use Dyspnea scale;Knowledge and understanding of Target Heart Rate Range (THRR);Able to check pulse independently;Understanding of Exercise Prescription Increase Physical Activity;Increase Strength and Stamina;Understanding of Exercise Prescription   Comments Reviewed RPE scale, THR and program prescription with pt today.  Pt voiced understanding and was given a copy of goals to take home. Elnoria has attended 3 sessions since orientation.  Consistent attendance will help her achieve better results.  Staff will monitor progress. Clarisa has increased speed on TM and works at [employer].  Staff will monitor progress. Lyly has a hard time attending consistently because she lives a longer drive away.  Staff will review home exercise. Dortha  continues to struggle with consistent attendance.  She is stress eating over her health but not doing home exericse.  We will continue to encourage improve attendance and monitor her progress.   Expected Outcomes Short: Use RPE daily to regulate intensity. Long: Follow program prescription in THR. Short: attend consistently Long: improve overall stamina Short : attend consistently Long: increase MET level Short add in home exercise whe she cant attend Long: improve stamina Short: Improved attendance  Long: Continue to improve stamina   Row Name 09/23/19 1536 10/07/19 1002 10/20/19 1613 11/05/19 1320 11/17/19 1547     Exercise Goal Re-Evaluation   Exercise Goals Review Increase Physical Activity;Increase Strength and Stamina;Understanding of Exercise Prescription Increase Physical Activity;Increase Strength and Stamina;Able to understand and use rate of perceived exertion (RPE) scale;Able to understand and use Dyspnea scale;Knowledge and understanding of Target Heart Rate Range (THRR);Able to check pulse independently;Understanding of Exercise Prescription Increase Physical Activity;Increase Strength and Stamina;Understanding of Exercise Prescription Increase Physical Activity;Increase Strength and Stamina;Understanding of Exercise Prescription Increase Physical Activity;Increase Strength and Stamina;Understanding of Exercise Prescription   Comments -- [place] attendance has been better in May.  Staff have discussed her exercising at a local rec center on off days. [place] has been doing well in rehab. Her attendance has fallen off some again, we will continue to encourage better attendance.  We will continue to monitor her progress. Maryellen hasmissed sessions due to having treatment for cancer.  Staff informed of the CARE program that will be avaialable and she seems interested in continuing with that after LW. Shalva starts treatments this week and will keep [place] updated.  We will continue to monitor her  progress.   Expected Outcomes -- Short:  exercise consistently at [facility] and home Long:  improve overall stamina Sh[place]: Return to regular attendance Long: Continue to improve stamina Short:  attend as much as possible Long : improve stamina and MET level Short: Continue to exercise to maintain strength Long; Continue to maintain stamina.          Discharge Exercise Prescription (Final Exercise Prescription Changes):  Exercise Prescription Changes - 11/17/19 1500      Response to Exercise   Blood Pressure (Admit) 102/70    Blood Pressure (Exercise) 120/80  Blood Pressure (Exit) 122/60    Heart Rate (Admit) 68 bpm    Heart Rate (Exercise) 70 bpm    Heart Rate (Exit) 60 bpm    Oxygen Saturation (Admit) 94 %    Oxygen Saturation (Exercise) 95 %    Oxygen Saturation (Exit) 97 %    Rating of Perceived Exertion (Exercise) 13    Perceived Dyspnea (Exercise) 0    Symptoms none    Duration Continue with 30 min of aerobic exercise without signs/symptoms of physical distress.    Intensity THRR unchanged      Progression   Progression Continue to progress workloads to maintain intensity without signs/symptoms of physical distress.    Average METs 2.16      Resistance Training   Training Prescription Yes    Weight 3 lb    Reps 10-15      Interval Training   Interval Training No      Treadmill   MPH 2.5    Grade 0    Minutes 15    METs 2.91      REL-XR   Level 4    Minutes 15    METs 1.4           Nutrition:  Target Goals: Understanding of nutrition guidelines, daily intake of sodium '1500mg'$ , cholesterol '200mg'$ , calories 30% from fat and 7% or less from saturated fats, daily to have 5 or more servings of fruits and vegetables.  Education: Controlling Sodium/Reading Food Labels -Group verbal and written material supporting the discussion of sodium use in heart healthy nutrition. Review and explanation with models, verbal and written materials for utilization of the food  label.   Education: General Nutrition Guidelines/Fats and Fiber: -Group instruction provided by verbal, written material, models and posters to present the general guidelines for heart healthy nutrition. Gives an explanation and review of dietary fats and fiber.   Biometrics:  Pre Biometrics - 07/21/19 1452      Pre Biometrics   Height 5' 5.5" (1.664 m)    Weight 182 lb 9.6 oz (82.8 kg)    BMI (Calculated) 29.91    Single Leg Stand 8.99 seconds            Nutrition Therapy Plan and Nutrition Goals:  Nutrition Therapy & Goals - 09/01/19 1434      Nutrition Therapy   Diet HH, Low Na    Protein (specify units) 65-70g    Fiber 25 grams    Whole Grain Foods 3 servings    Saturated Fats 12 max. grams    Fruits and Vegetables 3 servings/day    Sodium 1.5 grams      Personal Nutrition Goals   Nutrition Goal ST: mindful eating, increasing stress toolbox for coping LT: Increase fitness, decrease SOB (breathing is ok now, thinks due to less weight).    Comments Pt reports eating too much and emotional eating. Discussed HH eating and mindful eating. Pt reports not having a working stove. Pt to write down what she has at her disposal and practice mindful eating and working on a stress toolbox. Discussed therapy suggested talking with her doctor regarding setting something up.      Intervention Plan   Intervention Prescribe, educate and counsel regarding individualized specific dietary modifications aiming towards targeted core components such as weight, hypertension, lipid management, diabetes, heart failure and other comorbidities.;Nutrition handout(s) given to patient.    Expected Outcomes Short Term Goal: Understand basic principles of dietary content, such as calories, fat, sodium,  cholesterol and nutrients.;Short Term Goal: A plan has been developed with personal nutrition goals set during dietitian appointment.;Long Term Goal: Adherence to prescribed nutrition plan.            Nutrition Assessments:   MEDIFICTS Score Key:          ?70 Need to make dietary changes          40-70 Heart Healthy Diet         ? 40 Therapeutic Level Cholesterol Diet  Nutrition Goals Re-Evaluation:  Nutrition Goals Re-Evaluation    [place] Name 09/21/19 1551             Goals   Nutrition Goal ST: mindful eating, increasing stress toolbox for coping LT: Increase fitness, decrease SOB (breathing is ok now, thinks due to less weight).       Comment Pt has been stressed more than usual lately and has been using eating as her only coping mechanism, discussed some coping mechanisms that would help to reduce her stress in addition to eating.Pt would like to start reading and try meditation- used to have some classes at the [facility].       Expected Outcome ST: mindful eating, increasing stress toolbox for coping LT: Increase fitness, decrease SOB (breathing is ok now, thinks due to less weight).              Nutrition Goals Discharge (Final Nutrition Goals Re-Evaluation):  Nutrition Goals Re-Evaluation - 09/21/19 1551      Goals   Nutrition Goal ST: mindful eating, increasing stress toolbox for coping LT: Increase fitness, decrease SOB (breathing is ok now, thinks due to less weight).    Comment Pt has been stressed more than usual lately and has been using eating as her only coping mechanism, discussed some coping mechanisms that would help to reduce her stress in addition to eating.Pt would like to start reading and try meditation- used to have some classes at the [facility].    Expected Outcome ST: mindful eating, increasing stress toolbox for coping LT: Increase fitness, decrease SOB (breathing is ok now, thinks due to less weight).           Psychosocial: Target Goals: Acknowledge presence or absence of significant depression and/or stress, maximize coping skills, provide positive support system. Participant is able to verbalize types and ability to use techniques and skills needed for  reducing stress and depression.   Education: Depression - Provides group verbal and written instruction on the correlation between heart/lung disease and depressed mood, treatment options, and the stigmas associated with seeking treatment.   Education: Sleep Hygiene -Provides group verbal and written instruction about how sleep can affect your health.  Define sleep hygiene, discuss sleep cycles and impact of sleep habits. Review good sleep hygiene tips.    Education: Stress and Anxiety: - Provides group verbal and written instruction about the health risks of elevated stress and causes of high stress.  Discuss the correlation between heart/lung disease and anxiety and treatment options. Review healthy ways to manage with stress and anxiety.   Initial Review & Psychosocial Screening:  Initial Psych Review & Screening - 07/21/19 1350      Initial Review   Current issues with Current Depression;History of Depression;Current Anxiety/Panic;Current Sleep Concerns;Current Stress Concerns    Source of Stress Concerns Chronic Illness;Family;Transportation    Comments has insomnia, going through separation from spouse, needs gas card      [place]? No  Strains Intra-family strains    Comments has one daughter who works 2 jobs      Barriers   Psychosocial barriers to participate in program There are no identifiable barriers or psychosocial needs.      Screening Interventions   Interventions Encouraged to exercise    Expected Outcomes Short Term goal: Utilizing psychosocial counselor, staff and physician to assist with identification of specific Stressors or current issues interfering with healing process. Setting desired goal for each stressor or current issue identified.;Long Term Goal: Stressors or current issues are controlled or eliminated.;Short Term goal: Identification and review with participant of any Quality of Life or Depression concerns found by scoring  the questionnaire.;Long Term goal: The participant improves quality of Life and PHQ9 Scores as seen by post scores and/or verbalization of changes           Quality of Life Scores:  Quality of Life - 07/21/19 1447      Quality of Life   Select Quality of Life      Quality of Life Scores   Health/Function Pre 17.43 %    Socioeconomic Pre 16.25 %    Psych/Spiritual Pre 19.71 %    Family Pre 22.17 %    GLOBAL Pre 18.2 %          Scores of 19 and below usually indicate a poorer quality of life in these areas.  A difference of  2-3 points is a clinically meaningful difference.  A difference of 2-3 points in the total score of the Quality of Life Index has been associated with significant improvement in overall quality of life, self-image, physical symptoms, and general health in studies assessing change in quality of life.  PHQ-9: Recent Review Flowsheet Data    Depression screen [facility] 2/9 09/14/2019 07/21/2019   Decreased Interest 2 1   Down, Depressed, Hopeless 2 1   PHQ - 2 Score 4 2   Altered sleeping 3 1   Tired, decreased energy 1 1   Change in appetite 3 2   Feeling bad or failure about yourself  1 2   Trouble concentrating 0 1   Moving slowly or fidgety/restless 0 0   Suicidal thoughts 0 0   PHQ-9 Score 12 9   Difficult doing work/chores Not difficult at all Somewhat difficult     Interpretation of Total Score  Total Score Depression Severity:  1-4 = Minimal depression, 5-9 = Mild depression, 10-14 = Moderate depression, 15-19 = Moderately severe depression, 20-27 = Severe depression   Psychosocial Evaluation and Intervention:   Psychosocial Re-Evaluation:  Psychosocial Re-Evaluation    [place] Name 09/03/19 1515 09/21/19 1553           Psychosocial Re-Evaluation   Current issues with Current Sleep Concerns;Current Stress Concerns Current Sleep Concerns;Current Stress Concerns;Current Depression;Current Psychotropic Meds      Comments Hillary doesnt sleep well at  night - and gets very tired during the day.  She is having a sleep study done.  She also lives in [place] and it is a long drive. Adrian doesnt sleep well at night - and gets very tired during the day.  She is having a sleep study done.  She also lives in [place] and it is a long drive. She is still on depression medication and has been for a long time. Pt is stressed about her health and would like to lose weight, but she is still gaianing weight due to stress eating.  Expected Outcomes Short : staff will get her a gas card Long: develop better sleep patterns Short : try some coping mechanisms we discussed Long: develop better sleep patterns      Interventions Encouraged to attend Pulmonary Rehabilitation for the exercise Encouraged to attend Pulmonary Rehabilitation for the exercise      Continue Psychosocial Services  -- Follow up required by staff        Initial Review   Source of Stress Concerns -- Chronic Illness;Family;Transportation             Psychosocial Discharge (Final Psychosocial Re-Evaluation):  Psychosocial Re-Evaluation - 09/21/19 1553      Psychosocial Re-Evaluation   Current issues with Current Sleep Concerns;Current Stress Concerns;Current Depression;Current Psychotropic Meds    Comments Genesis doesnt sleep well at night - and gets very tired during the day.  She is having a sleep study done.  She also lives in [place] and it is a long drive. She is still on depression medication and has been for a long time. Pt is stressed about her health and would like to lose weight, but she is still gaianing weight due to stress eating.    Expected Outcomes Short : try some coping mechanisms we discussed Long: develop better sleep patterns    Interventions Encouraged to attend Pulmonary Rehabilitation for the exercise    Continue Psychosocial Services  Follow up required by staff      Initial Review   Source of Stress Concerns Chronic  Illness;Family;Transportation           Education: Education Goals: Education classes will be provided on a weekly basis, covering required topics. Participant will state understanding/return demonstration of topics presented.  Learning Barriers/Preferences:   General Pulmonary Education Topics:  Infection Prevention: - Provides verbal and written material to individual with discussion of infection control including proper hand washing and proper equipment cleaning during exercise session.   Pulmonary Rehab from 12/09/2019 in [facility] Cardiac and Pulmonary Rehab  Date 07/21/19  Educator AS  Instruction Review Code 1- Verbalizes Understanding      Falls Prevention: - Provides verbal and written material to individual with discussion of falls prevention and safety.   Pulmonary Rehab from 12/09/2019 in [facility] Cardiac and Pulmonary Rehab  Date 07/21/19  Educator AS  Instruction Review Code 1- Verbalizes Understanding      Chronic Lung Diseases: - Group verbal and written instruction to review updates, respiratory medications, advancements in procedures and treatments. Discuss use of supplemental oxygen including available portable oxygen systems, continuous and intermittent flow rates, concentrators, personal use and safety guidelines. Review proper use of inhaler and spacers. Provide informative websites for self-education.    Pulmonary Rehab from 12/09/2019 in [facility] Cardiac and Pulmonary Rehab  Date 12/09/19  Educator [place]  Instruction Review Code 1- Verbalizes Understanding      Energy Conservation: - Provide group verbal and written instruction for methods to conserve energy, plan and organize activities. Instruct on pacing techniques, use of adaptive equipment and posture/positioning to relieve shortness of breath.   Triggers and Exacerbations: - Group verbal and written instruction to review types of environmental triggers and ways to prevent exacerbations. Discuss weather  changes, air quality and the benefits of nasal washing. Review warning signs and symptoms to help prevent infections. Discuss techniques for effective airway clearance, coughing, and vibrations.   AED/CPR: - Group verbal and written instruction with the use of models to demonstrate the basic use of the AED with the basic ABC's of  resuscitation.   Anatomy and Physiology of the Lungs: - Group verbal and written instruction with the use of models to provide basic lung anatomy and physiology related to function, structure and complications of lung disease.   Anatomy & Physiology of the Heart: - Group verbal and written instruction and models provide basic cardiac anatomy and physiology, with the coronary electrical and arterial systems. Review of Valvular disease and Heart Failure   Cardiac Medications: - Group verbal and written instruction to review commonly prescribed medications for heart disease. Reviews the medication, class of the drug, and side effects.   Other: -Provides group and verbal instruction on various topics (see comments)   Knowledge Questionnaire Score:  Knowledge Questionnaire Score - 07/21/19 1441      Knowledge Questionnaire Score   Pre Score 21/26 (heart)            Core Components/Risk Factors/Patient Goals at Admission:  Personal Goals and Risk Factors at Admission - 07/21/19 1449      Core Components/Risk Factors/Patient Goals on Admission    Weight Management Weight Loss;Yes    Intervention Weight Management: Develop a combined nutrition and exercise program designed to reach desired caloric intake, while maintaining appropriate intake of nutrient and fiber, sodium and fats, and appropriate energy expenditure required for the weight goal.;Weight Management: Provide education and appropriate resources to help participant work on and attain dietary goals.;Weight Management/Obesity: Establish reasonable short term and long term weight goals.    Admit Weight  182 lb 9.6 oz (82.8 kg)    Goal Weight: Short Term 172 lb (78 kg)    Goal Weight: Long Term 162 lb (73.5 kg)    Expected Outcomes Short Term: Continue to assess and modify interventions until short term weight is achieved;Long Term: Adherence to nutrition and physical activity/exercise program aimed toward attainment of established weight goal;Weight Loss: Understanding of general recommendations for a balanced deficit meal plan, which promotes 1-2 lb weight loss per week and includes a negative energy balance of [phone] kcal/d;Understanding recommendations for meals to include 15-35% energy as protein, 25-35% energy from fat, 35-60% energy from carbohydrates, less than 261m of dietary cholesterol, 20-35 gm of total fiber daily;Understanding of distribution of calorie intake throughout the day with the consumption of 4-5 meals/snacks    Improve shortness of breath with ADL's Yes    Intervention Provide education, individualized exercise plan and daily activity instruction to help decrease symptoms of SOB with activities of daily living.    Expected Outcomes Short Term: Improve cardiorespiratory fitness to achieve a reduction of symptoms when performing ADLs;Long Term: Be able to perform more ADLs without symptoms or delay the onset of symptoms    Heart Failure Yes    Intervention Provide a combined exercise and nutrition program that is supplemented with education, support and counseling about heart failure. Directed toward relieving symptoms such as shortness of breath, decreased exercise tolerance, and extremity edema.    Expected Outcomes Improve functional capacity of life;Short term: Attendance in program 2-3 days a week with increased exercise capacity. Reported lower sodium intake. Reported increased fruit and vegetable intake. Reports medication compliance.;Short term: Daily weights obtained and reported for increase. Utilizing diuretic protocols set by physician.;Long term: Adoption of self-care  skills and reduction of barriers for early signs and symptoms recognition and intervention leading to self-care maintenance.    Hypertension Yes    Intervention Provide education on lifestyle modifcations including regular physical activity/exercise, weight management, moderate sodium restriction and increased consumption of fresh fruit, vegetables,  and low fat dairy, alcohol moderation, and smoking cessation.;Monitor prescription use compliance.    Expected Outcomes Short Term: Continued assessment and intervention until BP is < 140/39m HG in hypertensive participants. < 130/883mHG in hypertensive participants with diabetes, heart failure or chronic kidney disease.;Long Term: Maintenance of blood pressure at goal levels.    Stress Yes    Intervention Offer individual and/or small group education and counseling on adjustment to heart disease, stress management and health-related lifestyle change. Teach and support self-help strategies.;Refer participants experiencing significant psychosocial distress to appropriate mental health specialists for further evaluation and treatment. When possible, include family members and significant others in education/counseling sessions.           Education:Diabetes - Individual verbal and written instruction to review signs/symptoms of diabetes, desired ranges of glucose level fasting, after meals and with exercise. Acknowledge that pre and post exercise glucose checks will be done for 3 sessions at entry of program.   Education: Know Your Numbers and Risk Factors: -Group verbal and written instruction about important numbers in your health.  Discussion of what are risk factors and how they play a role in the disease process.  Review of Cholesterol, Blood Pressure, Diabetes, and BMI and the role they play in your overall health.   Core Components/Risk Factors/Patient Goals Review:   Goals and Risk Factor Review    [place] Name 09/03/19 1519 09/21/19 1557            Core Components/Risk Factors/Patient Goals Review   Personal Goals Review Weight Management/Obesity;Heart Failure;Improve shortness of breath with ADL's;Develop more efficient breathing techniques such as purse lipped breathing and diaphragmatic breathing and practicing self-pacing with activity. Weight Management/Obesity;Heart Failure;Improve shortness of breath with ADL's;Develop more efficient breathing techniques such as purse lipped breathing and diaphragmatic breathing and practicing self-pacing with activity.;Stress      Review ShJosclynan tell she can get around better since starting to exercise.  She is concerned beacuse she doesnt sleep well and gets tired during the day - sometimes too sleepy to drive.  She has spoekn with the HF clinic about monitoring weight. Pt has been stressed more than usual lately and has been using eating as her only coping mechanism, discussed some coping mechanisms that would help to reduce her stress in addition to eating.Pt would like to start reading and try meditation- used to [facility] classes [facility] the [facility]     Expected Outcomes Short:  weight consistently to monitor HF symptoms Long:  manage risk factors Short:  weight consistently to monitor HF symptoms, use coping mechanisms besides eating Long:  manage risk factors             Core Components/Risk Factors/Patient Goals at Discharge (Final Review):   Goals and Risk Factor Review - 09/21/19 1557      Core Components/Risk Factors/Patient Goals Review   Personal Goals Review Weight Management/Obesity;Heart Failure;Improve shortness of breath with ADL's;Develop more efficient breathing techniques such as purse lipped breathing and diaphragmatic breathing and practicing self-pacing with activity.;Stress    Review Pt has been stressed more than usual lately and has been using eating as her only coping mechanism, discussed some coping mechanisms that would help to reduce her stress in addition to eating.Pt  would like to start reading and try meditation- [facility]   Expected Outcomes Short:  weight consistently to monitor HF symptoms, use coping mechanisms besides eating Long:  manage risk factors  ITP Comments:  ITP Comments    [place] Name 08/03/19 1510 09/01/19 1431 11/04/19 0649 11/17/19 1547 12/02/19 1048   ITP Comments First full day of exercise!  Patient was oriented to gym and equipment including functions, settings, policies, and procedures.  Patient's individual exercise prescription and treatment plan were reviewed.  All starting workloads were established based on the results of the 6 minute walk test done at initial orientation visit.  The plan for exercise progression was also introduced and progression will be customized based on patient's performance and goals. Completed Initial RD Eval 30 Day review completed. Medical Director ITP review done, changes made as directed, and signed approval by Medical Director. 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 She will keep [place] posted with how she feels after her infusions. 30 Day review completed. Medical Director ITP review done, changes made as directed, and signed approval by Medical Director.   [facility] [place] Name 12/02/19 1238 12/09/19 1554         ITP Comments Janelys has not attended since last review. Sira graduated today from  rehab with 27 sessions completed.  Details of the patient's exercise prescription and what She needs to do in order to continue the prescription and progress were discussed with patient.  Patient was given a copy of prescription and goals.  Patient verbalized understanding.  Artemisa plans to continue to exercise by attending Care.             Comments: Discharge ITP

## 2019-12-09 NOTE — Progress Notes (Signed)
Discharge Progress Report  Patient Details  Name: Anna Romero MRN: [id number] Date of Birth: 11-18-1950 Referring Provider:     Pulmonary Rehab from 07/21/2019 in [facility]  Referring Provider Nehemiah Massed       Number of Visits: 26  Reason for Discharge:  Patient has met program and personal goals.  Smoking History:  Social History   Tobacco Use  Smoking Status Never Smoker  Smokeless Tobacco Never Used    Diagnosis:  Chronic systolic CHF (congestive heart failure), NYHA class 2 ([place])  ADL [facility]SD:  Pulmonary Assessment Scores    Row Name 12/09/19 1559         mMRC Score   mMRC Score 1            Initial Exercise Prescription:  Initial Exercise Prescription - 07/21/19 1400      Date of Initial Exercise RX and Referring Provider   Date 07/21/19    Referring Provider Nehemiah Massed      Treadmill   MPH 2    Grade 0    Minutes 15    METs 2.5      Recumbant Bike   Level 1    RPM 60    Minutes 15    METs 2.5      NuStep   Level 2    SPM 80    Minutes 15    METs 2.5      REL-XR   Level 2    Speed 50    Minutes 15    METs 2.5      Prescription Details   Frequency (times per week) 3    Duration Progress to 30 minutes of continuous aerobic without signs/symptoms of physical distress      Intensity   THRR 40-80% of Max Heartrate 95-132    Ratings of Perceived Exertion 11-15    Perceived Dyspnea 0-4      Resistance Training   Training Prescription Yes    Weight 3 lb    Reps 10-15           Discharge Exercise Prescription (Final Exercise Prescription Changes):  Exercise Prescription Changes - 11/17/19 1500      Response to Exercise   Blood Pressure (Admit) 102/70    Blood Pressure (Exercise) 120/80    Blood Pressure (Exit) 122/60    Heart Rate (Admit) 68 bpm    Heart Rate (Exercise) 70 bpm    Heart Rate (Exit) 60 bpm    Oxygen Saturation (Admit) 94 %    Oxygen Saturation (Exercise) 95 %    Oxygen Saturation (Exit)  97 %    Rating of Perceived Exertion (Exercise) 13    Perceived Dyspnea (Exercise) 0    Symptoms none    Duration Continue with 30 min of aerobic exercise without signs/symptoms of physical distress.    Intensity THRR unchanged      Progression   Progression Continue to progress workloads to maintain intensity without signs/symptoms of physical distress.    Average METs 2.16      Resistance Training   Training Prescription Yes    Weight 3 lb    Reps 10-15      Interval Training   Interval Training No      Treadmill   MPH 2.5    Grade 0    Minutes 15    METs 2.91      REL-XR   Level 4    Minutes 15    METs 1.4  Functional Capacity:  6 Minute Walk    Row Name 07/21/19 1421 12/09/19 1559       6 Minute Walk   Phase Initial Discharge    Distance 1250 feet 1398 feet    Distance % Change -- 11.8 %    Distance Feet Change -- 148 ft    Walk Time 6 minutes 6 minutes    # of Rest Breaks 0 0    MPH 2.36 2.64    METS 2.52 2.92    RPE 13 13    Perceived Dyspnea  1 0    VO2 Peak 8.8 10.2    Symptoms No No    Resting HR 57 bpm 69 bpm    Resting BP 128/74 128/70    Resting Oxygen Saturation  97 % 96 %    Exercise Oxygen Saturation  during 6 min walk 89 % 94 %    Max Ex. HR 83 bpm 93 bpm    Max Ex. BP 124/66 144/64    2 Minute Post BP 128/64 --      Interval HR   1 Minute HR 64 --    2 Minute HR 67 72    3 Minute HR 68 83    4 Minute HR 79 87    5 Minute HR 83 90    6 Minute HR 78 93    2 Minute Post HR 67 --    Interval Heart Rate? Yes Yes      Interval Oxygen   Interval Oxygen? Yes Yes    Baseline Oxygen Saturation % 97 % 96 %    1 Minute Oxygen Saturation % 95 % 96 %    1 Minute Liters of Oxygen 0 L 0 L    2 Minute Oxygen Saturation % 93 % 98 %    2 Minute Liters of Oxygen 0 L 0 L    3 Minute Oxygen Saturation % 89 % 96 %    3 Minute Liters of Oxygen 0 L 0 L    4 Minute Oxygen Saturation % 89 % 94 %    4 Minute Liters of Oxygen 0 L 0 L    5  Minute Oxygen Saturation % 91 % 98 %    5 Minute Liters of Oxygen 0 L 0 L    6 Minute Oxygen Saturation % 91 % 94 %    6 Minute Liters of Oxygen 0 L 0 L    2 Minute Post Oxygen Saturation % 94 % --    2 Minute Post Liters of Oxygen 0 L 0 L           Psychological, QOL, Others - Outcomes: PHQ 2/9: Depression screen [facility] 2/9 09/14/2019 07/21/2019  Decreased Interest 2 1  Down, Depressed, Hopeless 2 1  PHQ - 2 Score 4 2  Altered sleeping 3 1  Tired, decreased energy 1 1  Change in appetite 3 2  Feeling bad or failure about yourself  1 2  Trouble concentrating 0 1  Moving slowly or fidgety/restless 0 0  Suicidal thoughts 0 0  PHQ-9 Score 12 9  Difficult doing work/chores Not difficult at all Somewhat difficult    Quality of Life:  Quality of Life - 07/21/19 1447      Quality of Life   Select Quality of Life      Quality of Life Scores   Health/Function Pre 17.43 %    Socioeconomic Pre 16.25 %    Psych/Spiritual Pre  19.71 %    Family Pre 22.17 %    GLOBAL Pre 18.2 %           Personal Goals: Goals established at orientation with interventions provided to work toward goal.  Personal Goals and Risk Factors at Admission - 07/21/19 1449      Core Components/Risk Factors/Patient Goals on Admission    Weight Management Weight Loss;Yes    Intervention Weight Management: Develop a combined nutrition and exercise program designed to reach desired caloric intake, while maintaining appropriate intake of nutrient and fiber, sodium and fats, and appropriate energy expenditure required for the weight goal.;Weight Management: Provide education and appropriate resources to help participant work on and attain dietary goals.;Weight Management/Obesity: Establish reasonable short term and long term weight goals.    Admit Weight 182 lb 9.6 oz (82.8 kg)    Goal Weight: Short Term 172 lb (78 kg)    Goal Weight: Long Term 162 lb (73.5 kg)    Expected Outcomes Short Term: Continue to assess and  modify interventions until short term weight is achieved;Long Term: Adherence to nutrition and physical activity/exercise program aimed toward attainment of established weight goal;Weight Loss: Understanding of general recommendations for a balanced deficit meal plan, which promotes 1-2 lb weight loss per week and includes a negative energy balance of [phone] kcal/d;Understanding recommendations for meals to include 15-35% energy as protein, 25-35% energy from fat, 35-60% energy from carbohydrates, less than '200mg'$  of dietary cholesterol, 20-35 gm of total fiber daily;Understanding of distribution of calorie intake throughout the day with the consumption of 4-5 meals/snacks    Improve shortness of breath with ADL's Yes    Intervention Provide education, individualized exercise plan and daily activity instruction to help decrease symptoms of SOB with activities of daily living.    Expected Outcomes Short Term: Improve cardiorespiratory fitness to achieve a reduction of symptoms when performing ADLs;Long Term: Be able to perform more ADLs without symptoms or delay the onset of symptoms    Heart Failure Yes    Intervention Provide a combined exercise and nutrition program that is supplemented with education, support and counseling about heart failure. Directed toward relieving symptoms such as shortness of breath, decreased exercise tolerance, and extremity edema.    Expected Outcomes Improve functional capacity of life;Short term: Attendance in program 2-3 days a week with increased exercise capacity. Reported lower sodium intake. Reported increased fruit and vegetable intake. Reports medication compliance.;Short term: Daily weights obtained and reported for increase. Utilizing diuretic protocols set by physician.;Long term: Adoption of self-care skills and reduction of barriers for early signs and symptoms recognition and intervention leading to self-care maintenance.    Hypertension Yes    Intervention  Provide education on lifestyle modifcations including regular physical activity/exercise, weight management, moderate sodium restriction and increased consumption of fresh fruit, vegetables, and low fat dairy, alcohol moderation, and smoking cessation.;Monitor prescription use compliance.    Expected Outcomes Short Term: Continued assessment and intervention until BP is < 140/53m HG in hypertensive participants. < 130/854mHG in hypertensive participants with diabetes, heart failure or chronic kidney disease.;Long Term: Maintenance of blood pressure at goal levels.    Stress Yes    Intervention Offer individual and/or small group education and counseling on adjustment to heart disease, stress management and health-related lifestyle change. Teach and support self-help strategies.;Refer participants experiencing significant psychosocial distress to appropriate mental health specialists for further evaluation and treatment. When possible, include family members and significant others in education/counseling sessions.  Personal Goals Discharge:  Goals and Risk Factor Review    Row Name 09/03/19 1519 09/21/19 1557           Core Components/Risk Factors/Patient Goals Review   Personal Goals Review Weight Management/Obesity;Heart Failure;Improve shortness of breath with ADL's;Develop more efficient breathing techniques such as purse lipped breathing and diaphragmatic breathing and practicing self-pacing with activity. Weight Management/Obesity;Heart Failure;Improve shortness of breath with ADL's;Develop more efficient breathing techniques such as purse lipped breathing and diaphragmatic breathing and practicing self-pacing with activity.;Stress      Review Steffie can tell she can get around better since starting to exercise.  She is concerned beacuse she doesnt sleep well and gets tired during the day - sometimes too sleepy to drive.  She has spoekn with the HF clinic about monitoring weight.  Pt has been stressed more than usual lately and has been using eating as her only coping mechanism, discussed some coping mechanisms that would help to reduce her stress in addition to eating.Pt would like to start reading and try meditation- used to have some classes at the [facility].      Expected Outcomes Short:  weight consistently to monitor HF symptoms Long:  manage risk factors Short:  weight consistently to monitor HF symptoms, use coping mechanisms besides eating Long:  manage risk factors             Exercise Goals and Review:  Exercise Goals    Row Name 07/21/19 1451             Exercise Goals   Increase Physical Activity Yes       Intervention Provide advice, education, support and counseling about physical activity/exercise needs.;Develop an individualized exercise prescription for aerobic and resistive training based on initial evaluation findings, risk stratification, comorbidities and participant's personal goals.       Expected Outcomes Short Term: Attend rehab on a regular basis to increase amount of physical activity.;Long Term: Add in home exercise to make exercise part of routine and to increase amount of physical activity.;Long Term: Exercising regularly at least 3-5 days a week.       Increase Strength and Stamina Yes       Intervention Provide advice, education, support and counseling about physical activity/exercise needs.;Develop an individualized exercise prescription for aerobic and resistive training based on initial evaluation findings, risk stratification, comorbidities and participant's personal goals.       Expected Outcomes Short Term: Increase workloads from initial exercise prescription for resistance, speed, and METs.;Short Term: Perform resistance training exercises routinely during rehab and add in resistance training at home;Long Term: Improve cardiorespiratory fitness, muscular endurance and strength as measured by increased METs and functional capacity (6MWT)        Able to understand and use rate of perceived exertion (RPE) scale Yes       Intervention Provide education and explanation on how to use RPE scale       Expected Outcomes Short Term: Able to use RPE daily in rehab to express subjective intensity level;Long Term:  Able to use RPE to guide intensity level when exercising independently       Able to understand and use Dyspnea scale Yes       Intervention Provide education and explanation on how to use Dyspnea scale       Expected Outcomes Short Term: Able to use Dyspnea scale daily in rehab to express subjective sense of shortness of breath during exertion;Long Term: Able to use Dyspnea scale to guide intensity  level when exercising independently       Knowledge and understanding of Target Heart Rate Range (THRR) Yes       Intervention Provide education and explanation of THRR including how the numbers were predicted and where they are located for reference       Expected Outcomes Short Term: Able to state/look up THRR;Short Term: Able to use daily as guideline for intensity in rehab;Long Term: Able to use THRR to govern intensity when exercising independently       Able to check pulse independently Yes       Intervention Provide education and demonstration on how to check pulse in carotid and radial arteries.;Review the importance of being able to check your own pulse for safety during independent exercise       Expected Outcomes Short Term: Able to explain why pulse checking is important during independent exercise;Long Term: Able to check pulse independently and accurately       Understanding of Exercise Prescription Yes       Intervention Provide education, explanation, and written materials on patient's individual exercise prescription       Expected Outcomes Short Term: Able to explain program exercise prescription;Long Term: Able to explain home exercise prescription to exercise independently              Exercise Goals  Re-Evaluation:  Exercise Goals Re-Evaluation    Row Name 08/03/19 1511 08/13/19 1305 08/28/19 1329 09/09/19 1204 09/22/19 1530     Exercise Goal Re-Evaluation   Exercise Goals Review Increase Physical Activity;Able to understand and use rate of perceived exertion (RPE) scale;Knowledge and understanding of Target Heart Rate Range (THRR);Understanding of Exercise Prescription;Increase Strength and Stamina;Able to understand and use Dyspnea scale;Able to check pulse independently Increase Physical Activity;Increase Strength and Stamina;Able to understand and use rate of perceived exertion (RPE) scale;Able to understand and use Dyspnea scale;Knowledge and understanding of Target Heart Rate Range (THRR);Able to check pulse independently;Understanding of Exercise Prescription Increase Physical Activity;Increase Strength and Stamina;Able to understand and use rate of perceived exertion (RPE) scale;Able to understand and use Dyspnea scale;Knowledge and understanding of Target Heart Rate Range (THRR);Able to check pulse independently;Understanding of Exercise Prescription Increase Physical Activity;Increase Strength and Stamina;Able to understand and use rate of perceived exertion (RPE) scale;Able to understand and use Dyspnea scale;Knowledge and understanding of Target Heart Rate Range (THRR);Able to check pulse independently;Understanding of Exercise Prescription Increase Physical Activity;Increase Strength and Stamina;Understanding of Exercise Prescription   Comments Reviewed RPE scale, THR and program prescription with pt today.  Pt voiced understanding and was given a copy of goals to take home. Yocheved has attended 3 sessions since orientation.  Consistent attendance will help her achieve better results.  Staff will monitor progress. Balinda has increased speed on TM and works at [employer].  Staff will monitor progress. Elliemae has a hard time attending consistently because she lives a longer drive away.  Staff will  review home exercise. Omaira continues to struggle with consistent attendance.  She is stress eating over her health but not doing home exericse.  We will continue to encourage improve attendance and monitor her progress.   Expected Outcomes Short: Use RPE daily to regulate intensity. Long: Follow program prescription in THR. Short: attend consistently Long: improve overall stamina Short : attend consistently Long: increase MET level Short add in home exercise whe she cant attend Long: improve stamina Short: Improved attendance  Long: Continue to improve stamina   Row Name 09/23/19 1536 10/07/19 1002 10/20/19 1613  11/05/19 1320 11/17/19 1547     Exercise Goal Re-Evaluation   Exercise Goals Review Increase Physical Activity;Increase Strength and Stamina;Understanding of Exercise Prescription Increase Physical Activity;Increase Strength and Stamina;Able to understand and use rate of perceived exertion (RPE) scale;Able to understand and use Dyspnea scale;Knowledge and understanding of Target Heart Rate Range (THRR);Able to check pulse independently;Understanding of Exercise Prescription Increase Physical Activity;Increase Strength and Stamina;Understanding of Exercise Prescription Increase Physical Activity;Increase Strength and Stamina;Understanding of Exercise Prescription Increase Physical Activity;Increase Strength and Stamina;Understanding of Exercise Prescription   Comments -- Russum attendance has been better in May.  Staff have discussed her exercising at a local rec center on off days. Melvina has been doing well in rehab. Her attendance has fallen off some again, we will continue to encourage better attendance.  We will continue to monitor her progress. Emyah hasmissed sessions due to having treatment for cancer.  Staff informed of the CARE program that will be avaialable and she seems interested in continuing with that after LW. Meoshia starts treatments this week and will keep [place] updated.  We will  continue to monitor her progress.   Expected Outcomes -- Short:  exercise consistently at [facility] and home Long:  improve overall stamina Shrot: Return to regular attendance Long: Continue to improve stamina Short:  attend as much as possible Long : improve stamina and MET level Short: Continue to exercise to maintain strength Long; Continue to maintain stamina.          Nutrition & Weight - Outcomes:  Pre Biometrics - 07/21/19 1452      Pre Biometrics   Height 5' 5.5" (1.664 m)    Weight 182 lb 9.6 oz (82.8 kg)    BMI (Calculated) 29.91    Single Leg Stand 8.99 seconds            Nutrition:  Nutrition Therapy & Goals - 09/01/19 1434      Nutrition Therapy   Diet HH, Low Na    Protein (specify units) 65-70g    Fiber 25 grams    Whole Grain Foods 3 servings    Saturated Fats 12 max. grams    Fruits and Vegetables 3 servings/day    Sodium 1.5 grams      Personal Nutrition Goals   Nutrition Goal ST: mindful eating, increasing stress toolbox for coping LT: Increase fitness, decrease SOB (breathing is ok now, thinks due to less weight).    Comments Pt reports eating too much and emotional eating. Discussed HH eating and mindful eating. Pt reports not having a working stove. Pt to write down what she has at her disposal and practice mindful eating and working on a stress toolbox. Discussed therapy suggested talking with her doctor regarding setting something up.      Intervention Plan   Intervention Prescribe, educate and counsel regarding individualized specific dietary modifications aiming towards targeted core components such as weight, hypertension, lipid management, diabetes, heart failure and other comorbidities.;Nutrition handout(s) given to patient.    Expected Outcomes Short Term Goal: Understand basic principles of dietary content, such as calories, fat, sodium, cholesterol and nutrients.;Short Term Goal: A plan has been developed with personal nutrition goals set during  dietitian appointment.;Long Term Goal: Adherence to prescribed nutrition plan.           Nutrition Discharge:   Education Questionnaire Score:  Knowledge Questionnaire Score - 12/09/19 1725      Knowledge Questionnaire Score   Post Score 21/26  Goals reviewed with patient; copy given to patient.

## 2020-01-20 ENCOUNTER — Other Ambulatory Visit: Payer: Self-pay

## 2020-01-20 ENCOUNTER — Inpatient Hospital Stay: Payer: Medicare Other | Attending: Obstetrics and Gynecology | Admitting: Obstetrics and Gynecology

## 2020-01-20 VITALS — BP 141/72 | HR 52 | Temp 97.8°F | Resp 20 | Wt 191.0 lb

## 2020-01-20 DIAGNOSIS — E782 Mixed hyperlipidemia: Secondary | ICD-10-CM | POA: Insufficient documentation

## 2020-01-20 DIAGNOSIS — C541 Malignant neoplasm of endometrium: Secondary | ICD-10-CM | POA: Insufficient documentation

## 2020-01-20 DIAGNOSIS — Z975 Presence of (intrauterine) contraceptive device: Secondary | ICD-10-CM | POA: Diagnosis not present

## 2020-01-20 DIAGNOSIS — F329 Major depressive disorder, single episode, unspecified: Secondary | ICD-10-CM | POA: Insufficient documentation

## 2020-01-20 DIAGNOSIS — Z7951 Long term (current) use of inhaled steroids: Secondary | ICD-10-CM | POA: Diagnosis not present

## 2020-01-20 DIAGNOSIS — F419 Anxiety disorder, unspecified: Secondary | ICD-10-CM | POA: Diagnosis not present

## 2020-01-20 DIAGNOSIS — J449 Chronic obstructive pulmonary disease, unspecified: Secondary | ICD-10-CM | POA: Diagnosis not present

## 2020-01-20 DIAGNOSIS — I110 Hypertensive heart disease with heart failure: Secondary | ICD-10-CM | POA: Insufficient documentation

## 2020-01-20 DIAGNOSIS — I5022 Chronic systolic (congestive) heart failure: Secondary | ICD-10-CM | POA: Insufficient documentation

## 2020-01-20 DIAGNOSIS — N183 Chronic kidney disease, stage 3 unspecified: Secondary | ICD-10-CM | POA: Insufficient documentation

## 2020-01-20 DIAGNOSIS — E559 Vitamin D deficiency, unspecified: Secondary | ICD-10-CM | POA: Insufficient documentation

## 2020-01-20 DIAGNOSIS — Z79899 Other long term (current) drug therapy: Secondary | ICD-10-CM | POA: Diagnosis not present

## 2020-01-20 DIAGNOSIS — J4520 Mild intermittent asthma, uncomplicated: Secondary | ICD-10-CM | POA: Insufficient documentation

## 2020-01-20 NOTE — Patient Instructions (Signed)

## 2020-01-20 NOTE — Progress Notes (Addendum)
Gynecologic Oncology Consult Visit   Referring Provider: Dr. Leafy Ro  Chief Concern: endometrial cancer  Subjective:  Anna Romero is a 69 y.o. female seen in consultation from Dr. Leafy Ro for endometrial cancer, s/p Mirena IUD, who returns to clinic for endometrial biopsy.   She has a history of   MRI of Abd 11/09/2019: Impression:Negative. No evidence of abdominal metastatic disease or other significant abnormality.   MRI of Pelvis 11/10/2019: Impression: 2 cm mass within the endometrial cavity, consistent with known endometrial carcinoma. This shows adjacent myometrial invasion to depth of <50%. (FIGO stage IA). No evidence of pelvic metastatic disease. Discussed with the radiologist.    Mirena IUD was inserted by Dr. Leafy Ro on 11/17/19.   Some occasional spotting with IUD, no other complaints.  She has been doing rehab and now walking a mile a day, and can do two miles.   No other complaints.  Trying to lose weight, but not able to so far.   Gynecologic Oncology History:  Bleeding started and Pt called 09/01/2019 with c/o with first time PMB. She was having an actual period with good flow for a few days, no cramping. Initial bleeding lasted for 2.5 days, then stopped, then restarted again the following week lasting also only a few days. Saw Dr Leafy Ro in [place] who recommended D&C.   D&C/Hysteroscopy 10/12/19 Uterus measuring 7 cm by sound; normal cervix, vagina, perineum. Atrophic endometrium with 3 large masses. 2 appeared to be polyps and one was irregular and calcified. This one did not get removed entirely by the [facility] but a gritty texture was noted after curreting.   DIAGNOSIS:  A. 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  - PORTIONS OF BENIGN ECTOCERVICAL TISSUE.  - NO DEFINITIVE MYOMETRIAL TISSUE PRESENT TO EVALUATE FOR INVASION.   B. ENDOCERVIX; CURETTAGE:  - SUPERFICIAL FRAGMENTS OF BENIGN  ENDOCERVICAL AND ECTOCERVICAL TISSUE.  - NEGATIVE FOR DYSPLASIA AND MALIGNANCY.   C. ENDOMETRIAL POLYP; CURETTAGE:  - DISRUPTED POLYPOID FRAGMENTS OF ENDOMETRIOID ENDOMETRIAL  ADENOCARCINOMA, LOW GRADE (WHO).  - NO DEFINITIVE MYOMETRIAL TISSUE PRESENT FOR EVALUATION OF INVASION.   Comment:  The tumor predominantly displays a glandular pattern, with minimal solid  growth. The findings are intermediate between FIGO grade 1 and grade 2,  with a FIGO grade 1 tumor favored in this fragmented specimen. Overall  WHO classification would recommend defining grade 1 and 2 tumors as "low  grade".      ADDENDUM:  Immunohistochemistry (IHC) Testing for DNA Mismatch Repair (MMR)  Proteins:  Results:  MLH1: Intact nuclear expression  MSH2: Intact nuclear expression  MSH6: Intact nuclear expression  PMS2: Intact nuclear expression  IHC Interpretation: No loss of nuclear expression of MMR proteins: Low probability of MSI-H.   No further bleeding after hysteroscopy and D&C.  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 pulmonary hypertension multifactorial in nature with groupings including secondary causes of chf and valve disease.  She has gained 30 pounds in the past year from eating too much, not due to fluid retention.  She is in cardiac/pulmonary rehab. Last saw her cardiologist Dr Nehemiah Massed in 3/21 and assessed to be stable.    Problem List: Patient Active Problem List   Diagnosis Date Noted  . Endometrial cancer ([place]) 10/28/2019  .  Cough 08/03/2019  . Mild intermittent asthma 12/16/2018  . 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 Depression 07/15/2018  . Polymyositis ([place]) 05/30/2017  . Chronic kidney insufficiency, stage 3 (moderate)  03/26/2017  . Breast calcification, right 01/30/2017  . Polyarthralgia 10/12/2016  . Myalgia 10/12/2016  . Right elbow pain 10/12/2016  . Bradycardia 04/04/2016  . Chronic insomnia 03/31/2016  . Mild episode of recurrent major depressive disorder ([place]) 03/31/2016  . Cognitive impairment 03/26/2016  . Gastroesophageal reflux 09/23/2015  . 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 Moderate mitral insufficiency 05/24/2015  . Chronic systolic CHF (congestive heart failure), NYHA class 2 ([place]) 04/18/2015  . High risk medication use 03/21/2015  . Benign essential hypertension 10/01/2014  . Hypomagnesemia 09/08/2014  . Vitamin D deficiency, unspecified 09/08/2014  . Osteoarthritis 04/05/2014  . Degenerative disc disease, cervical 03/29/2014  . Hyperlipidemia, mixed 03/29/2014  . Monoclonal gammopathy 03/29/2014  . Pulmonary hypertension ([place]) 10/21/2013    Past Medical History: Past Medical History:  Diagnosis Date  . Anxiety   . Asthma   . CHF (congestive heart failure) ([place])   . Chronic kidney disease    Renal Insufficiency Stage 3  . Degenerative disc disease, cervical   . Depression   . GERD (gastroesophageal reflux disease)   . Hyperlipidemia   . Hypertension   . Mixed restrictive and obstructive lung disease ([place])    Predominantly restriction due to chronic volume loss in the left lung.  Mild small airways disease.  . Moderate mitral insufficiency   . Monoclonal gammopathy   . Polymyositis ([place])   . Pulmonary hypertension ([place])     Past Surgical History: Past Surgical History:  Procedure Laterality Date  . APPENDECTOMY    . BREAST EXCISIONAL BIOPSY Left 1990   negative  . CATARACT EXTRACTION    . COLONOSCOPY N/A 08/27/2016   Procedure: COLONOSCOPY;  Surgeon: Lollie Sails, MD;  [facility] END[facility]COPY;  Service: Endoscopy;  Laterality: N/A;  . COLONOSCOPY WITH PROPOFOL N/A 07/22/2018   Procedure: COLONOSCOPY WITH PROPOFOL;  Surgeon: Lollie Sails,  MD;  Location: [facility] ENDOSCOPY;  Service: Endoscopy;  Laterality: N/A;  . HYSTEROSCOPY WITH D & C N/A 10/12/2019   Procedure: DILATATION AND CURETTAGE /HYSTEROSCOPY, POLYPECTOMY OR MYOMECTOMY;  Surgeon: Benjaman Kindler, MD;  Location: [facility] ORS;  Service: Gynecology;  Laterality: N/A;  . JOINT REPLACEMENT    . REPLACEMENT TOTAL HIP W/  RESURFACING IMPLANTS Right   . THYMECTOMY     Family History: Family History  Adopted: Yes  Problem Relation Age of Onset  . Breast cancer Neg Hx     Social History: Social History   Socioeconomic History  . Marital status: Legally Separated    Spouse name: matthew  . Number of children: 1  . Years of education: Not on file  . Highest education level: Associate degree: occupational, Hotel manager, or vocational program  Occupational History    Comment: disabled  Tobacco Use  . Smoking status: Never Smoker  . Smokeless tobacco: Never Used  Vaping Use  . Vaping Use: Never used  Substance and Sexual Activity  . Alcohol use: No  . Drug use: No  . Sexual activity: Yes    Birth control/protection: None  Other Topics Concern  . Not on file  Social History Narrative  . Not on file   Social Determinants of Health   Financial Resource Strain:   . Difficulty of Paying Living Expenses: Not on file  Food Insecurity:   .  Worried About Charity fundraiser in the Last Year: Not on file  . Ran Out of Food in the Last Year: Not on file  Transportation Needs:   . Lack of Transportation (Medical): Not on file  . Lack of Transportation (Non-Medical): Not on file  Physical Activity:   . Days of Exercise per Week: Not on file  . Minutes of Exercise per Session: Not on file  Stress:   . Feeling of Stress : Not on file  Social Connections:   . Frequency of Communication with Friends and Family: Not on file  . Frequency of Social Gatherings with Friends and Family: Not on file  . Attends Religious Services: Not on file  . Active Member of Clubs or  Organizations: Not on file  . Attends Archivist Meetings: Not on file  . Marital Status: Not on file  Intimate Partner Violence:   . Fear of Current or Ex-Partner: Not on file  . Emotionally Abused: Not on file  . Physically Abused: Not on file  . Sexually Abused: Not on file    Allergies: Allergies  Allergen Reactions  . Hctz [Hydrochlorothiazide] Other (See Comments)    Kidney Disorder    Current Medications: Current Outpatient Medications  Medication Sig Dispense Refill  . acetaminophen (TYLENOL) 500 MG tablet Take 1,000 mg by mouth every 8 (eight) hours as needed for moderate pain.     Marland Kitchen albuterol (VENTOLIN HFA) 108 (90 Base) MCG/ACT inhaler Inhale 1-2 puffs into the lungs every 6 (six) hours as needed for wheezing or shortness of breath. (Patient not taking: Reported on 10/28/2019) 18 g 5  . budesonide-formoterol (SYMBICORT) 160-4.5 MCG/ACT inhaler Inhale 2 puffs into the lungs 2 (two) times daily. 1 Inhaler 3  . buPROPion (WELLBUTRIN SR) 200 MG 12 hr tablet Take 200 mg by mouth every morning.     . carvedilol (COREG) 12.5 MG tablet Take 12.5 mg by mouth 2 (two) times daily with a meal.    . carvedilol (COREG) 12.5 MG tablet Take by mouth.    . clonazePAM (KLONOPIN) 0.5 MG tablet Take 0.5 mg by mouth 2 (two) times daily as needed for anxiety.    . donepezil (ARICEPT) 10 MG tablet Take 10 mg by mouth at bedtime.    Marland Kitchen doxepin (SINEQUAN) 25 MG capsule Take 25-50 mg by mouth at bedtime as needed (sleep).     . furosemide (LASIX) 20 MG tablet Take 20 mg by mouth daily as needed for fluid.     . memantine (NAMENDA) 5 MG tablet Take 5 mg by mouth 2 (two) times daily.    . Multiple Vitamins-Minerals (MULTIVITAMIN WITH MINERALS) tablet Take 1 tablet by mouth daily.    Marland Kitchen omeprazole (PRILOSEC) 20 MG capsule Take 20 mg by mouth daily.    Marland Kitchen spironolactone (ALDACTONE) 25 MG tablet Take 25 mg by mouth daily.    Marland Kitchen telmisartan (MICARDIS) 40 MG tablet Take 40 mg by mouth daily.    .  traZODone (DESYREL) 100 MG tablet Take 100-200 mg by mouth at bedtime as needed for sleep.      No current facility-administered medications for this visit.   Review of Systems General:  no complaints Skin: no complaints Eyes: no complaints HEENT: no complaints Breasts: no complaints Pulmonary: no complaints Cardiac: no complaints Gastrointestinal: no complaints Genitourinary/Sexual: no complaints Ob/Gyn: no complaints Musculoskeletal: no complaints Hematology: no complaints Neurologic/Psych: no complaints  Objective:  Physical Examination:  BP (!) 141/72   Pulse (!) 52  Temp 97.8 F (36.6 C)   Resp 20   Wt 191 lb (86.6 kg)   SpO2 100%   BMI 30.37 kg/m    ECOG Performance Status: 2 - Symptomatic, <50% confined to bed  GENERAL: chronically ill appearing. Needs assistance to step up on exam table.  HEENT:  PERRL, neck supple with midline trachea. Thyroid without masses.  NODES:  No cervical, supraclavicular, axillary, or inguinal lymphadenopathy palpated.  LUNGS:  Clear to auscultation bilaterally.  No wheezes or rhonchi. HEART:  Regular rate and rhythm. No murmur appreciated. ABDOMEN:  Soft, nontender.  Positive, normoactive bowel sounds.  MSK:  No focal spinal tenderness to palpation. Full range of motion bilaterally in the upper extremities. EXTREMITIES:  No peripheral edema. Superficial varices in lower legs SKIN:  Clear with no obvious rashes or skin changes.  NEURO:  Nonfocal. Well oriented.  Appropriate affect.  Pelvic: exam chaperoned by nurse, EGBUS within normal limits;  Vulva: normal appearing vulva with no masses, tenderness or lesions; Vagina: normal vagina; Adnexa: normal adnexa in size, nontender and no masses; Uterus: uterus is normal size, shape, consistency and nontender; Cervix: anteverted with some bloody mucus in os, IUD string visible; Rectal: deferred..  Endometrial biopsy done after signed consent and time out.  Os cleaned and then [place]  introduced and aspirated twice with some bloody mucus and tissue.  She tolerated the procedure well and there were no complications.   Lab Results  Component Value Date   WBC 7.4 10/08/2019   HGB 13.3 10/08/2019   HCT 40.0 10/08/2019   MCV 97.8 10/08/2019   PLT 184 10/08/2019     Chemistry      Component Value Date/Time   NA 139 10/08/2019 1341   NA 140 01/26/2014 0558   K 4.1 10/08/2019 1341   K 3.8 01/26/2014 0558   CL 104 10/08/2019 1341   CL 107 01/26/2014 0558   CO2 25 10/08/2019 1341   CO2 27 01/26/2014 0558   BUN 23 10/08/2019 1341   BUN 22 (H) 01/26/2014 0558   CREATININE 1.04 (H) 10/08/2019 1341   CREATININE 1.11 01/26/2014 0558      Component Value Date/Time   CALCIUM 9.4 10/08/2019 1341   CALCIUM 9.1 01/26/2014 0558   ALKPHOS 122 03/11/2012 1023   AST 35 03/11/2012 1023   ALT 37 03/11/2012 1023   BILITOT 0.4 03/11/2012 1023      Assessment:  Anna Romero is a 69 y.o. female diagnosed with grade 1-2 endometrial cancer in polyps s/p D&C with Myosure resection.  Rest of endometrium atrophic. Poor performance status due to CHG and was in cardiac/pulmonary rehab. MRI done and she may have some myometrial invasion, but <50%.  Discussed with radiologist.  Mirena IUD placed in 11/17/19 in view of poor PS.    She has some occasional spotting.  PS now improved and able to walk a mile or two on the track at the gym.  Immunohistochemistry (IHC) Testing for DNA Mismatch Repair (MMR) shows retained expression of all four proteins.  Medical co-morbidities complicating care:  CHF with significant deconditioning.   Moderate mitral insufficiency  Chronic kidney disease  Renal Insufficiency Stage 3 Depression  Hypertension  Mixed restrictive and obstructive lung disease ([place]): Predominantly restriction due to chronic volume loss in the left lung.  Mild small airways disease. Pulmonary hypertension multifactorial in nature with groupings including secondary causes of  chf and valve disease.  Plan:   Problem List Items Addressed This Visit  Genitourinary   Endometrial cancer ([place]) - Primary     We recommended a non-surgical option to start in view of high efficacy of Mirena IUD in low grade disease with about 75% regression rate.  Hysterectomy to be considered if follow up biopsies showed persistent disease.  Biopsy done today and some bloody mucus and tissue obtained.  If persistent endometrial cancer could consider hysterectomy in view of improved PS.  Or we could continue with the IUD and plan to biopsy again in 2-3 months.  Will contact her when results available.    The patient's diagnosis, an outline of the further diagnostic and laboratory studies which will be required, the recommendation, and alternatives were discussed.  All questions were answered to the patient's satisfaction.  A total of 60 minutes were spent with the patient/family today; 60 % was spent in education, counseling and coordination of care for endometrial cancer.    Verlon Au, NP  I personally interviewed and examined the patient. Agreed with the above/below plan of care. I have directly contributed to assessment and plan of care of this patient and educated and discussed with patient and family.  Mellody Drown, MD   CC:  Ezequiel Kayser, MD [facility] [place],  [place] [id number] [phone]

## 2020-01-22 ENCOUNTER — Telehealth: Payer: Self-pay

## 2020-01-22 LAB — SURGICAL PATHOLOGY

## 2020-01-22 NOTE — Telephone Encounter (Signed)
Results of endometrial biopsy sent to Dr. Fransisca Connors for review.

## 2020-01-27 ENCOUNTER — Telehealth: Payer: Self-pay

## 2020-01-27 NOTE — Telephone Encounter (Signed)
Endometrial biopsy collected 01/20/20.  DIAGNOSIS:  A. ENDOMETRIUM; BIOPSY:  - ENDOMETRIAL ADENOCARCINOMA, ENDOMETRIOID TYPE, FIGO GRADE 1.  Dr. Fransisca Connors has reviewed and would like to see her at [facility] this Friday to discuss having surgery at [facility] due to her medical history. She would like to discuss this further with her daughter and give [place] a call back this afternoon.

## 2020-03-08 ENCOUNTER — Other Ambulatory Visit: Payer: Self-pay | Admitting: Pulmonary Disease

## 2020-04-05 ENCOUNTER — Ambulatory Visit: Payer: Medicare Other | Admitting: Pulmonary Disease

## 2020-04-08 ENCOUNTER — Telehealth: Payer: Self-pay

## 2020-04-08 NOTE — Telephone Encounter (Signed)
Called and arrange post operative appointment with Ms. Anna Romero. Scheduled for 12/8 at 1330.

## 2020-04-27 ENCOUNTER — Other Ambulatory Visit: Payer: Self-pay

## 2020-04-27 ENCOUNTER — Inpatient Hospital Stay: Payer: Medicare Other | Attending: Internal Medicine | Admitting: Obstetrics and Gynecology

## 2020-04-27 VITALS — BP 148/89 | HR 54 | Temp 97.2°F | Resp 18 | Wt 184.6 lb

## 2020-04-27 DIAGNOSIS — Z90710 Acquired absence of both cervix and uterus: Secondary | ICD-10-CM

## 2020-04-27 DIAGNOSIS — C541 Malignant neoplasm of endometrium: Secondary | ICD-10-CM

## 2020-04-27 DIAGNOSIS — Z90722 Acquired absence of ovaries, bilateral: Secondary | ICD-10-CM

## 2020-04-27 NOTE — Progress Notes (Signed)
Gynecologic Oncology Consult Visit   Referring Provider: Dr. Leafy Ro  Chief Concern: endometrial cancer, post op visit  Subjective:  Anna Romero is a 69 y.o. female seen in consultation from Dr. Leafy Ro for endometrial cancer, s/p Mirena IUD with persistent disease, s/p TLH-BSO who returns to clinic for post-op evaluation.   Doing well post op with some slightly irregular bowel movements and occasional hot flashed.   Gynecologic Oncology History:  Bleeding started and Pt called 09/01/2019 with c/o with first time PMB. She was having an actual period with good flow for a few days, no cramping. Initial bleeding lasted for 2.5 days, then stopped, then restarted again the following week lasting also only a few days. Saw Dr Leafy Ro in [place] who recommended D&C.  D&C/Hysteroscopy 10/12/19 Uterus measuring 7 cm by sound; normal cervix, vagina, perineum. Atrophic endometrium with 3 large masses. 2 appeared to be polyps and one was irregular and calcified. This one did not get removed entirely by the [facility] but a gritty texture was noted after curreting.   DIAGNOSIS:  A. ENDOMETRIUM; CURETTAGE:  - DISRUPTED POLYPOID FRAGMENTS OF ENDOMETRIOID ENDOMETRIAL ADENOCARCINOMA, LOW GRADE (WHO).  - PORTIONS OF BENIGN ECTOCERVICAL TISSUE.  - NO DEFINITIVE MYOMETRIAL TISSUE PRESENT TO EVALUATE FOR INVASION.   B. ENDOCERVIX; CURETTAGE:  - SUPERFICIAL FRAGMENTS OF BENIGN ENDOCERVICAL AND ECTOCERVICAL TISSUE.  - NEGATIVE FOR DYSPLASIA AND MALIGNANCY.   C. ENDOMETRIAL POLYP; CURETTAGE:  - DISRUPTED POLYPOID FRAGMENTS OF ENDOMETRIOID ENDOMETRIAL  ADENOCARCINOMA, LOW GRADE (WHO).  - NO DEFINITIVE MYOMETRIAL TISSUE PRESENT FOR EVALUATION OF INVASION.   Comment:  The tumor predominantly displays a glandular pattern, with minimal solid growth. The findings are intermediate between FIGO grade 1 and grade 2, with a FIGO grade 1 tumor favored in this fragmented specimen. Overall WHO classification would recommend  defining grade 1 and 2 tumors as "low grade".     ADDENDUM:  Immunohistochemistry (IHC) Testing for DNA Mismatch Repair (MMR)  Proteins:  Results:  MLH1: Intact nuclear expression  MSH2: Intact nuclear expression  MSH6: Intact nuclear expression  PMS2: Intact nuclear expression  IHC Interpretation: No loss of nuclear expression of MMR proteins: Low probability of MSI-H.   No further bleeding after hysteroscopy and D&C.  MRI of Abd 11/09/2019: Impression:Negative. No evidence of abdominal metastatic disease or other significant abnormality.   MRI of Pelvis 11/10/2019: Impression: 2 cm mass within the endometrial cavity, consistent with known endometrial carcinoma. This shows adjacent myometrial invasion to depth of <50%. (FIGO stage IA). No evidence of pelvic metastatic disease. Discussed with the radiologist.    Due to poor performance status and heart failure rehab, Mirena IUD was inserted by Dr. Leafy Ro on 11/17/19.  Performance status improved. Endometrial biopsy was repeated 01/20/20 and showed persistent grade 1 cancer.    In view of persistent disease and improved cardiac function surgery with TLH/BSO and SLN mapping and biopsies was performed at [facility] 03/22/20.    A. Left obturator sentinel lymph node, excisional biopsy:  Four lymph nodes, negative for malignancy (0/4).  B.  Uterus, cervix, bilateral fallopian tubes, and ovaries:  Cervix: No significant pathologic diagnosis.  Endomyometrium and serosa: Endometrial adenocarcinoma (1.5 cm), endometrioid type, grade 1 (of 3). Tumor invades through ~11% of the myometrial thickness (0.2 of 1.8 cm). No lymphovascular invasion is identified. The serosa is not involved. Leiomyomas. Adenomyosis.  MMR/MSI testing negative.   Right ovary: Simple cyst.  Right fallopian tube: No significant pathologic diagnosis.  Left ovary: Simple cyst. Left fallopian tube: No significant  pathologic diagnosis.  Problem List: Patient  Active Problem List   Diagnosis Date Noted  . Endometrial cancer ([place]) 10/28/2019  . Cough 08/03/2019  . Mild intermittent asthma 12/16/2018  . 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 Depression 07/15/2018  . Polymyositis ([place]) 05/30/2017  . Chronic kidney insufficiency, stage 3 (moderate) (HCC) 03/26/2017  . Breast calcification, right 01/30/2017  . Polyarthralgia 10/12/2016  . Myalgia 10/12/2016  . Right elbow pain 10/12/2016  . Bradycardia 04/04/2016  . Chronic insomnia 03/31/2016  . Mild episode of recurrent major depressive disorder ([place]) 03/31/2016  . Cognitive impairment 03/26/2016  . Gastroesophageal reflux 09/23/2015  . 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 Moderate mitral insufficiency 05/24/2015  . Chronic systolic CHF (congestive heart failure), NYHA class 2 ([place]) 04/18/2015  . High risk medication use 03/21/2015  . Benign essential hypertension 10/01/2014  . Hypomagnesemia 09/08/2014  . Vitamin D deficiency, unspecified 09/08/2014  . Osteoarthritis 04/05/2014  . Degenerative disc disease, cervical 03/29/2014  . Hyperlipidemia, mixed 03/29/2014  . Monoclonal gammopathy 03/29/2014  . Pulmonary hypertension ([place]) 10/21/2013    Past Medical History: Past Medical History:  Diagnosis Date  . Anxiety   . Asthma   . CHF (congestive heart failure) ([place])   . Chronic kidney disease    Renal Insufficiency Stage 3  . Degenerative disc disease, cervical   . Depression   . GERD (gastroesophageal reflux disease)   . Hyperlipidemia   . Hypertension   . Mixed restrictive and obstructive lung disease ([place])    Predominantly restriction due to chronic volume loss in the left lung.  Mild small airways disease.  . Moderate mitral insufficiency   . Monoclonal gammopathy   . Polymyositis ([place])   . Pulmonary hypertension ([place])     Past Surgical History: Past Surgical History:  Procedure Laterality Date  . APPENDECTOMY    . BREAST  EXCISIONAL BIOPSY Left 1990   negative  . CATARACT EXTRACTION    . COLONOSCOPY N/A 08/27/2016   Procedure: COLONOSCOPY;  Surgeon: Lollie Sails, MD;  Location[facility] ENDOSCOPY;  Service: Endoscopy;  Laterality: N/A;  . COLONOSCOPY WITH PROPOFOL N/A 07/22/2018   Procedure: COLONOSCOPY WITH PROPOFOL;  Surgeon: Lollie Sails, MD;  Location: [facility] ENDOSCOPY;  Service: Endoscopy;  Laterality: N/A;  . HYSTEROSCOPY WITH D & C N/A 10/12/2019   Procedure: DILATATION AND CURETTAGE /HYSTEROSCOPY, POLYPECTOMY OR MYOMECTOMY;  Surgeon: Benjaman Kindler, MD;  Location: [facility] ORS;  Service: Gynecology;  Laterality: N/A;  . JOINT REPLACEMENT    . REPLACEMENT TOTAL HIP W/  RESURFACING IMPLANTS Right   . THYMECTOMY     Family History: Family History  Adopted: Yes  Problem Relation Age of Onset  . Breast cancer Neg Hx    Immunization History  Administered Date(s) Administered  . Influenza Inj Mdck Quad Pf 02/20/2016, 03/13/2019  . Influenza Split 02/18/2014  . Influenza, High Dose Seasonal PF 01/16/2018  . Influenza,inj,Quad PF,6+ Mos 02/22/2017  . Influenza-Unspecified 02/08/2012, 03/03/2013, 02/18/2014, 03/18/2015  . PFIZER SARS-COV-2 Vaccination 07/16/2019, 08/12/2019  . Pneumococcal Polysaccharide-23 03/26/2016  . Rabies Immune Globulin 10/29/2015, 11/01/2015, 11/05/2015, 11/12/2015  . Rabies, IM 10/29/2015, 11/01/2015, 11/05/2015, 11/12/2015  . Td 12/25/1994  . Tdap 10/28/2015    Social History: Social History   Socioeconomic History  . Marital status: Legally Separated    Spouse name: matthew  . Number of children: 1  . Years of education: Not on file  . Highest education level: Associate degree: occupational, Hotel manager, or vocational program  Occupational History    Comment: disabled  Tobacco Use  . Smoking status: Never Smoker  . Smokeless tobacco: Never Used  Vaping Use  . Vaping Use: Never used  Substance and Sexual Activity  . Alcohol use: No  . Drug use: No  . Sexual  activity: Yes    Birth control/protection: None  Other Topics Concern  . Not on file  Social History Narrative  . Not on file   Social Determinants of Health   Financial Resource Strain:   . Difficulty of Paying Living Expenses: Not on file  Food Insecurity:   . Worried About Charity fundraiser in the Last Year: Not on file  . Ran Out of Food in the Last Year: Not on file  Transportation Needs:   . Lack of Transportation (Medical): Not on file  . Lack of Transportation (Non-Medical): Not on file  Physical Activity:   . Days of Exercise per Week: Not on file  . Minutes of Exercise per Session: Not on file  Stress:   . Feeling of Stress : Not on file  Social Connections:   . Frequency of Communication with Friends and Family: Not on file  . Frequency of Social Gatherings with Friends and Family: Not on file  . Attends Religious Services: Not on file  . Active Member of Clubs or Organizations: Not on file  . Attends Archivist Meetings: Not on file  . Marital Status: Not on file  Intimate Partner Violence:   . Fear of Current or Ex-Partner: Not on file  . Emotionally Abused: Not on file  . Physically Abused: Not on file  . Sexually Abused: Not on file    Allergies: Allergies  Allergen Reactions  . Hctz [Hydrochlorothiazide] Other (See Comments)    Kidney Disorder    Current Medications: Current Outpatient Medications  Medication Sig Dispense Refill  . acetaminophen (TYLENOL) 500 MG tablet Take 1,000 mg by mouth every 8 (eight) hours as needed for moderate pain.     Marland Kitchen albuterol (VENTOLIN HFA) 108 (90 Base) MCG/ACT inhaler Inhale 1-2 puffs into the lungs every 6 (six) hours as needed for wheezing or shortness of breath. 18 g 5  . buPROPion (WELLBUTRIN SR) 200 MG 12 hr tablet Take 200 mg by mouth every morning.     . carvedilol (COREG) 12.5 MG tablet Take 12.5 mg by mouth 2 (two) times daily with a meal.    . carvedilol (COREG) 12.5 MG tablet Take by mouth.     . clonazePAM (KLONOPIN) 0.5 MG tablet Take 0.5 mg by mouth 2 (two) times daily as needed for anxiety.    . donepezil (ARICEPT) 10 MG tablet Take 10 mg by mouth at bedtime.    Marland Kitchen doxepin (SINEQUAN) 25 MG capsule Take 25-50 mg by mouth at bedtime as needed (sleep).     . furosemide (LASIX) 20 MG tablet Take 20 mg by mouth daily as needed for fluid.     . memantine (NAMENDA) 5 MG tablet Take 5 mg by mouth 2 (two) times daily.    . Multiple Vitamins-Minerals (MULTIVITAMIN WITH MINERALS) tablet Take 1 tablet by mouth daily.    Marland Kitchen omeprazole (PRILOSEC) 20 MG capsule Take 20 mg by mouth daily.    Marland Kitchen spironolactone (ALDACTONE) 25 MG tablet Take 25 mg by mouth daily.    . SYMBICORT 160-4.5 MCG/ACT inhaler TAKE 2 PUFFS BY MOUTH TWICE A DAY 10.2 each 3  . telmisartan (MICARDIS) 40 MG tablet Take 40 mg  by mouth daily.    Marland Kitchen tiZANidine (ZANAFLEX) 4 MG tablet Take 4 mg by mouth 3 (three) times daily as needed.    . traZODone (DESYREL) 100 MG tablet Take 100-200 mg by mouth at bedtime as needed for sleep.      No current facility-administered medications for this visit.   Review of Systems General:  no complaints Skin: no complaints Eyes: no complaints HEENT: no complaints Breasts: no complaints Pulmonary: no complaints Cardiac: no complaints Gastrointestinal: no complaints Genitourinary/Sexual: bladder Ob/Gyn: vulvar irritation Musculoskeletal: no complaints Hematology: no complaints Neurologic/Psych: no complaints  Objective:  Physical Examination:  BP (!) 148/89   Pulse (!) 54   Temp (!) 97.2 F (36.2 C) (Tympanic)   Resp 18   Wt 184 lb 9.6 oz (83.7 kg)   SpO2 99%   BMI 29.35 kg/m    ECOG Performance Status: 2 - Symptomatic, <50% confined to bed  GENERAL: chronically ill appearing.  HEENT:  Sclera clear. Anicteric NODES:  Negative axillary, supraclavicular, inguinal lymph node survery LUNGS:  Clear to auscultation bilaterally.   HEART:  Regular rate and rhythm.  ABDOMEN:  Soft,  nontender.  No hernias, incisions well healed. No masses or ascites EXTREMITIES:  No peripheral edema. Atraumatic. No cyanosis SKIN:  Clear with no obvious rashes or skin changes.  NEURO:  Nonfocal. Well oriented.  Appropriate affect.  Pelvic: exam chaperoned by nursing, EGBUS within normal limits;  Vulva: normal appearing vulva with no masses, tenderness or lesions; Vagina: vaginal cuff healing well & intact.  Adnexa, uterus, cervix: surgically absent. Rectal: deferred..  Lab Results  Component Value Date   WBC 7.4 10/08/2019   HGB 13.3 10/08/2019   HCT 40.0 10/08/2019   MCV 97.8 10/08/2019   PLT 184 10/08/2019     Chemistry      Component Value Date/Time   NA 139 10/08/2019 1341   NA 140 01/26/2014 0558   K 4.1 10/08/2019 1341   K 3.8 01/26/2014 0558   CL 104 10/08/2019 1341   CL 107 01/26/2014 0558   CO2 25 10/08/2019 1341   CO2 27 01/26/2014 0558   BUN 23 10/08/2019 1341   BUN 22 (H) 01/26/2014 0558   CREATININE 1.04 (H) 10/08/2019 1341   CREATININE 1.11 01/26/2014 0558      Component Value Date/Time   CALCIUM 9.4 10/08/2019 1341   CALCIUM 9.1 01/26/2014 0558   ALKPHOS 122 03/11/2012 1023   AST 35 03/11/2012 1023   ALT 37 03/11/2012 1023   BILITOT 0.4 03/11/2012 1023      Assessment:  CERA RORKE is a 69 y.o. 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  Repeat biopsy 01/20/20 showed persistent grade 1 cancer and so had surgery 03/22/20 with TLH/BSO, SLN.  Tumor size 1.5 cm invading 43m in 18 mm myometrium, negative LVSI, cervix, serosa, tubes, ovaries, lymph nodes and washings. Post op today and doing well.   Immunohistochemistry (IHC) Testing for DNA Mismatch Repair (MMR) shows retained expression of all four proteins.  Medical co-morbidities complicating care:  CHF with significant deconditioning.   Moderate mitral insufficiency  Chronic kidney  disease  Renal Insufficiency Stage 3 Depression  Hypertension  Mixed restrictive and obstructive lung disease [place]: Predominantly restriction due to chronic volume loss in the left lung.  Mild small airways disease. Pulmonary hypertension multifactorial in nature with groupings including secondary causes of chf and valve disease.  Plan:   Problem List Items Addressed This Visit      Genitourinary   Endometrial cancer [facility]) - Primary     Discussed the pathology report with patient and her daughter and that no adjuvant therapy is indicated based on stage IA grade 1 disease.    She is recovering well and we will see her back in 4 months.  Then can alternate visits with Dr Leafy Ro.   The patient's diagnosis, an outline of the further diagnostic and laboratory studies which will be required, the recommendation, and alternatives were discussed.  All questions were answered to the patient's satisfaction.  Verlon Au, NP  I personally interviewed and examined the patient. Agreed with the above/below plan of care. I have directly contributed to assessment and plan of care of this patient and educated and discussed with patient and family.  Mellody Drown, MD   CC:  Ezequiel Kayser, MD [place] [facility] [place],  [place] [address] [phone]

## 2020-05-02 ENCOUNTER — Other Ambulatory Visit: Payer: Self-pay | Admitting: Obstetrics and Gynecology

## 2020-05-02 DIAGNOSIS — Z1231 Encounter for screening mammogram for malignant neoplasm of breast: Secondary | ICD-10-CM

## 2020-06-14 ENCOUNTER — Telehealth: Payer: Self-pay | Admitting: Pulmonary Disease

## 2020-06-14 MED ORDER — ALBUTEROL SULFATE HFA 108 (90 BASE) MCG/ACT IN AERS: 1.0000 | INHALATION_SPRAY | Freq: Four times a day (QID) | RESPIRATORY_TRACT | 5 refills | Status: DC | PRN

## 2020-06-14 NOTE — Telephone Encounter (Addendum)
Called and spoke to patient, who is requesting refill on Ventolin.  Rx has been sent to preferred pharmacy.  Patient is aware and voiced her understanding.  Nothing further needed.

## 2020-07-07 ENCOUNTER — Other Ambulatory Visit: Payer: Self-pay

## 2020-07-07 ENCOUNTER — Ambulatory Visit
Admission: RE | Admit: 2020-07-07 | Discharge: 2020-07-07 | Disposition: A | Discharge status: Home / Self Care | Payer: Medicare Other | Source: Ambulatory Visit | Attending: Obstetrics and Gynecology | Admitting: Obstetrics and Gynecology

## 2020-07-07 DIAGNOSIS — Z1231 Encounter for screening mammogram for malignant neoplasm of breast: Secondary | ICD-10-CM | POA: Diagnosis present

## 2020-07-08 ENCOUNTER — Other Ambulatory Visit: Payer: Self-pay | Admitting: Pulmonary Disease

## 2020-07-14 ENCOUNTER — Other Ambulatory Visit: Payer: Self-pay | Admitting: Obstetrics and Gynecology

## 2020-07-14 DIAGNOSIS — N6320 Unspecified lump in the left breast, unspecified quadrant: Secondary | ICD-10-CM

## 2020-07-14 DIAGNOSIS — R928 Other abnormal and inconclusive findings on diagnostic imaging of breast: Secondary | ICD-10-CM

## 2020-07-22 ENCOUNTER — Other Ambulatory Visit: Payer: Medicare Other

## 2020-07-22 ENCOUNTER — Ambulatory Visit: Payer: Medicare Other | Attending: Obstetrics and Gynecology

## 2020-08-03 ENCOUNTER — Other Ambulatory Visit: Payer: Self-pay

## 2020-08-03 ENCOUNTER — Ambulatory Visit
Admission: RE | Admit: 2020-08-03 | Discharge: 2020-08-03 | Disposition: A | Discharge status: Home / Self Care | Payer: Medicare Other | Source: Ambulatory Visit | Attending: Obstetrics and Gynecology | Admitting: Obstetrics and Gynecology

## 2020-08-03 ENCOUNTER — Other Ambulatory Visit: Payer: Self-pay | Admitting: Obstetrics and Gynecology

## 2020-08-03 DIAGNOSIS — N6310 Unspecified lump in the right breast, unspecified quadrant: Secondary | ICD-10-CM

## 2020-08-03 DIAGNOSIS — R928 Other abnormal and inconclusive findings on diagnostic imaging of breast: Secondary | ICD-10-CM

## 2020-08-12 ENCOUNTER — Emergency Department
Admission: EM | Admit: 2020-08-12 | Discharge: 2020-08-12 | Disposition: A | Discharge status: Home / Self Care | Payer: Medicare Other | Attending: Emergency Medicine | Admitting: Emergency Medicine

## 2020-08-12 ENCOUNTER — Encounter: Payer: Self-pay | Admitting: Emergency Medicine

## 2020-08-12 ENCOUNTER — Other Ambulatory Visit: Payer: Self-pay

## 2020-08-12 ENCOUNTER — Emergency Department: Payer: Medicare Other

## 2020-08-12 DIAGNOSIS — Z79899 Other long term (current) drug therapy: Secondary | ICD-10-CM | POA: Diagnosis not present

## 2020-08-12 DIAGNOSIS — Z96641 Presence of right artificial hip joint: Secondary | ICD-10-CM | POA: Diagnosis not present

## 2020-08-12 DIAGNOSIS — J4520 Mild intermittent asthma, uncomplicated: Secondary | ICD-10-CM | POA: Diagnosis not present

## 2020-08-12 DIAGNOSIS — I130 Hypertensive heart and chronic kidney disease with heart failure and stage 1 through stage 4 chronic kidney disease, or unspecified chronic kidney disease: Secondary | ICD-10-CM | POA: Diagnosis not present

## 2020-08-12 DIAGNOSIS — R0600 Dyspnea, unspecified: Secondary | ICD-10-CM | POA: Insufficient documentation

## 2020-08-12 DIAGNOSIS — R0602 Shortness of breath: Secondary | ICD-10-CM | POA: Diagnosis present

## 2020-08-12 DIAGNOSIS — N183 Chronic kidney disease, stage 3 unspecified: Secondary | ICD-10-CM | POA: Insufficient documentation

## 2020-08-12 DIAGNOSIS — I5022 Chronic systolic (congestive) heart failure: Secondary | ICD-10-CM | POA: Insufficient documentation

## 2020-08-12 DIAGNOSIS — Z8542 Personal history of malignant neoplasm of other parts of uterus: Secondary | ICD-10-CM | POA: Diagnosis not present

## 2020-08-12 LAB — BASIC METABOLIC PANEL
Anion gap: 7 (ref 5–15)
BUN: 28 mg/dL — ABNORMAL HIGH (ref 8–23)
CO2: 26 mmol/L (ref 22–32)
Calcium: 9.8 mg/dL (ref 8.9–10.3)
Chloride: 106 mmol/L (ref 98–111)
Creatinine, Ser: 0.93 mg/dL (ref 0.44–1.00)
GFR, Estimated: >60 mL/min (ref >60)
Glucose, Bld: 124 mg/dL — ABNORMAL HIGH (ref 70–99)
Potassium: 4.6 mmol/L (ref 3.5–5.1)
Sodium: 139 mmol/L (ref 135–145)

## 2020-08-12 LAB — CBC
HCT: 39.8 % (ref 36.0–46.0)
Hemoglobin: 12.7 g/dL (ref 12.0–15.0)
MCH: 33 pg (ref 26.0–34.0)
MCHC: 31.9 g/dL (ref 30.0–36.0)
MCV: 103.4 fL — ABNORMAL HIGH (ref 80.0–100.0)
Platelets: 187 10*3/uL (ref 150–400)
RBC: 3.85 MIL/uL — ABNORMAL LOW (ref 3.87–5.11)
RDW: 12.8 % (ref 11.5–15.5)
WBC: 8.4 10*3/uL (ref 4.0–10.5)
nRBC: 0 % (ref 0.0–0.2)

## 2020-08-12 LAB — BRAIN NATRIURETIC PEPTIDE: B Natriuretic Peptide: 87.9 pg/mL (ref 0.0–100.0)

## 2020-08-12 LAB — TROPONIN I (HIGH SENSITIVITY): Troponin I (High Sensitivity): 17 ng/L (ref <18)

## 2020-08-12 NOTE — ED Triage Notes (Signed)
First ;Nurse Note:  Arrives from [facility] for ED evaluation of 3 day history of SOB, smelling toxic substance in home, and possible confusion.  Patient is awake and alert. Respirations regular and non labored.  NAD

## 2020-08-12 NOTE — ED Notes (Signed)
Pt given two warm blankets.  

## 2020-08-12 NOTE — ED Notes (Signed)
Patient ambulatory to and from restroom with steady gait, patient reports she is feeling much better.

## 2020-08-12 NOTE — ED Provider Notes (Signed)
[facility] Emergency Department Provider Note  Time seen: 5:58 PM  I have reviewed the triage vital signs and the nursing notes.   HISTORY  Chief Complaint Shortness of Breath   HPI Anna Romero is a 70 y.o. female with a past medical history of anxiety, CHF, gastric reflux, hypertension, hyperlipidemia, presents to the emergency department for multiple complaints.   According to the patient she has been smelling something "toxic" in her house.  Patient states the smell is always there but only in one room of her home.  States for the past several days she has been feeling somewhat short of breath and has had a cough with occasional sputum production.  This morning she states she felt like her throat was closing due to the smell, but since that has resolved.  Patient denies any chest pain.  Denies any cough or fever.  No abdominal pain or vomiting but does state occasional loose stool.  Patient states she has had the fire department come out to evaluate for carbon monoxide in the past which has been negative.  Patient is not entirely sure where the smell is coming from, but normally does not bother her so much.  Patient went to [facility] clinic walk-in today and they sent her to the emergency department for evaluation.   Past Medical History:  Diagnosis Date  . Anxiety   . Asthma   . CHF (congestive heart failure) ([place])   . Chronic kidney disease    Renal Insufficiency Stage 3  . Degenerative disc disease, cervical   . Depression   . GERD (gastroesophageal reflux disease)   . Hyperlipidemia   . Hypertension   . Mixed restrictive and obstructive lung disease ([place])    Predominantly restriction due to chronic volume loss in the left lung.  Mild small airways disease.  . Moderate mitral insufficiency   . Monoclonal gammopathy   . Polymyositis ([place])   . Pulmonary hypertension [facility])     Patient Active Problem List   Diagnosis Date Noted  . Endometrial cancer  ([place]) 10/28/2019  . Cough 08/03/2019  . Mild intermittent asthma 12/16/2018  . Mixed restrictive and obstructive lung disease ([place]) 12/16/2018  . Depression 07/15/2018  . Polymyositis ([place]) 05/30/2017  . Chronic kidney insufficiency, stage 3 (moderate) (HCC) 03/26/2017  . Breast calcification, right 01/30/2017  . Polyarthralgia 10/12/2016  . Myalgia 10/12/2016  . Right elbow pain 10/12/2016  . Bradycardia 04/04/2016  . Chronic insomnia 03/31/2016  . Mild episode of recurrent major depressive disorder ([place]) 03/31/2016  . Cognitive impairment 03/26/2016  . Gastroesophageal reflux 09/23/2015  . 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 Moderate mitral insufficiency 05/24/2015  . Chronic systolic CHF (congestive heart failure), NYHA class 2 ([place]) 04/18/2015  . High risk medication use 03/21/2015  . Benign essential hypertension 10/01/2014  . Hypomagnesemia 09/08/2014  . Vitamin D deficiency, unspecified 09/08/2014  . Osteoarthritis 04/05/2014  . Degenerative disc disease, cervical 03/29/2014  . Hyperlipidemia, mixed 03/29/2014  . Monoclonal gammopathy 03/29/2014  . Pulmonary hypertension ([place]) 10/21/2013    Past Surgical History:  Procedure Laterality Date  . APPENDECTOMY    . BREAST EXCISIONAL BIOPSY Left 1990   negative  . CATARACT EXTRACTION    . COLONOSCOPY N/A 08/27/2016   Procedure: COLONOSCOPY;  Surgeon: Lollie Sails, MD;  Location: [facility] ENDOSCOPY;  Service: Endoscopy;  Laterality: N/A;  . COLONOSCOPY WITH PROPOFOL N/A 07/22/2018   Procedure: COLONOSCOPY WITH PROPOFOL;  Surgeon: Lollie Sails, MD;  Location: [facility] ENDOSCOPY;  Service: Endoscopy;  Laterality: N/A;  . HYSTEROSCOPY WITH D & C N/A 10/12/2019   Procedure: DILATATION AND CURETTAGE /HYSTEROSCOPY, POLYPECTOMY OR MYOMECTOMY;  Surgeon: Benjaman Kindler, MD;  Location: [facility] ORS;  Service: Gynecology;  Laterality: N/A;  . JOINT REPLACEMENT    . REPLACEMENT TOTAL HIP W/  RESURFACING IMPLANTS Right   .  THYMECTOMY      Prior to Admission medications   Medication Sig Start Date End Date Taking? Authorizing Provider  acetaminophen (TYLENOL) 500 MG tablet Take 1,000 mg by mouth every 8 (eight) hours as needed for moderate pain.     [provider]  albuterol (VENTOLIN HFA) 108 (90 Base) MCG/ACT inhaler Inhale 1-2 puffs into the lungs every 6 (six) hours as needed for wheezing or shortness of breath. 06/14/20   Tyler Pita, MD  buPROPion [facility] SR) 200 MG 12 hr tablet Take 200 mg by mouth every morning.  09/15/19   [provider]  carvedilol (COREG) 12.5 MG tablet Take 12.5 mg by mouth 2 (two) times daily with a meal.    [provider]  clonazePAM (KLONOPIN) 0.5 MG tablet Take 0.5 mg by mouth 2 (two) times daily as needed for anxiety.    [provider]  donepezil (ARICEPT) 10 MG tablet Take 10 mg by mouth at bedtime.    [provider]  doxepin (SINEQUAN) 25 MG capsule Take 25-50 mg by mouth at bedtime as needed (sleep).  09/02/19   [provider]  furosemide (LASIX) 20 MG tablet Take 20 mg by mouth daily as needed for fluid.     [provider]  memantine (NAMENDA) 5 MG tablet Take 5 mg by mouth 2 (two) times daily. 09/20/19   [provider]  Multiple Vitamins-Minerals (MULTIVITAMIN WITH MINERALS) tablet Take 1 tablet by mouth daily.    [provider]  omeprazole (PRILOSEC) 20 MG capsule Take 20 mg by mouth daily. Patient not taking: Reported on 04/27/2020    [provider]  spironolactone (ALDACTONE) 25 MG tablet Take 25 mg by mouth daily.    [provider]  SYMBICORT 160-4.5 MCG/ACT inhaler TAKE 2 PUFFS BY MOUTH TWICE A DAY 07/08/20   Tyler Pita, MD  telmisartan (MICARDIS) 40 MG tablet Take 40 mg by mouth daily.    [provider]  traZODone (DESYREL) 100 MG tablet Take 100-200 mg by mouth at bedtime as needed for sleep.  Patient not taking: Reported on 04/27/2020  05/24/18   [provider]    Allergies  Allergen Reactions  . Hctz [Hydrochlorothiazide] Other (See Comments)    Kidney Disorder    Family History  Adopted: Yes  Problem Relation Age of Onset  . Breast cancer Neg Hx     Social History Social History   Tobacco Use  . Smoking status: Never Smoker  . Smokeless tobacco: Never Used  Vaping Use  . Vaping Use: Never used  Substance Use Topics  . Alcohol use: No  . Drug use: No    Review of Systems Constitutional: Negative for fever. Cardiovascular: Negative for chest pain. Respiratory: Shortness of breath x2 to 3 days.  Smells a "toxic" smell in her house. Gastrointestinal: Negative for abdominal pain, vomiting.  Does have occasional diarrhea. Genitourinary: Negative for urinary compaints Musculoskeletal: Negative for musculoskeletal complaints Neurological: Negative for headache All other ROS negative  ____________________________________________   PHYSICAL EXAM:  VITAL SIGNS: ED Triage Vitals  Enc Vitals Group     BP  08/12/20 1726 (!) 165/87     Pulse Rate 08/12/20 1726 (!) 55     Resp 08/12/20 1726 18     Temp 08/12/20 1726 98.2 F (36.8 C)     Temp src --      SpO2 08/12/20 1726 98 %     Weight 08/12/20 1728 176 lb (79.8 kg)     Height 08/12/20 1728 5\' 6"  (1.676 m)     Head Circumference --      Peak Flow --      Pain Score 08/12/20 1727 0     Pain Loc --      Pain Edu? --      Excl. in [place]? --     Constitutional: Alert and oriented. Well appearing and in no distress. Eyes: Normal exam ENT      Head: Normocephalic and atraumatic.      Mouth/Throat: Mucous membranes are moist. Cardiovascular: Normal rate, regular rhythm. Respiratory: Normal respiratory effort without tachypnea nor retractions. Breath sounds are clear  Gastrointestinal: Soft and nontender. No distention.   Musculoskeletal: Nontender with normal range of motion in all extremities Neurologic:  Normal speech and language. No  gross focal neurologic deficits  Skin:  Skin is warm, dry and intact.  Psychiatric: Mood and affect are normal.   ____________________________________________    EKG  EKG viewed and interpreted by myself shows sinus bradycardia 51 bpm with a slightly widened QRS, normal axis, normal intervals, nonspecific ST changes.  ____________________________________________    RADIOLOGY  Chest x-ray is negative for acute process.  ____________________________________________   INITIAL IMPRESSION / ASSESSMENT AND PLAN / ED COURSE  Pertinent labs & imaging results that were available during my care of the patient were reviewed by me and considered in my medical decision making (see chart for details).   Patient presents to the emergency department for shortness of breath over the past 2 to 3 days which she relates to a smell she is feeling in her house but states this has been an ongoing issue for a long time but seems to be affecting her more today.  Patient states earlier today she felt like her throat was closing and states that has since resolved denies any sensation currently.  Denies any chest pain.  Does state mild shortness of breath.  Patient is awake alert oriented, speaking in complete sentences with no distress.  We will check labs, VBG, carboxyhemoglobin, chest x-ray, EKG and continue to closely monitor.  Currently patient satting 98 to 100% on room air.  Patient's work-up is essentially negative.  Carboxyhemoglobin 1.6.  Lab work is reassuring.  We will discharge the patient home with PCP follow-up.  Patient states she is feeling well.  CLORINE SWING was evaluated in Emergency Department on 08/12/2020 for the symptoms described in the history of present illness. She was evaluated in the context of the global COVID-19 pandemic, which necessitated consideration that the patient might be at risk for infection with the SARS-CoV-2 virus that causes COVID-19. Institutional protocols and  algorithms that pertain to the evaluation of patients at risk for COVID-19 are in a state of rapid change based on information released by regulatory bodies including the CDC and federal and state organizations. These policies and algorithms were followed during the patient's care in the ED.  ____________________________________________   FINAL CLINICAL IMPRESSION(S) / ED DIAGNOSES  Dyspnea   Harvest Dark, MD 08/12/20 2054

## 2020-08-12 NOTE — ED Triage Notes (Signed)
See first nurse note.  Pt also reports feeling like throat was closing today.  Also reports she feels weak and has not been eating. Pt reports all symptoms are due to smelling something in her home.

## 2020-08-25 LAB — COOXEMETRY PANEL
Carboxyhemoglobin: 1.6 % — ABNORMAL HIGH (ref 0.5–1.5)
Methemoglobin: 0.9 % (ref 0.0–1.5)

## 2020-08-25 LAB — BLOOD GAS, VENOUS
Acid-Base Excess: 1 mmol/L (ref 0.0–2.0)
Bicarbonate: 26.6 mmol/L (ref 20.0–28.0)
O2 Saturation: 53.5 %
Patient temperature: 37
pCO2, Ven: 45 mmHg (ref 44.0–60.0)
pH, Ven: 7.38 (ref 7.250–7.430)

## 2020-08-30 ENCOUNTER — Ambulatory Visit: Payer: Medicare Other | Admitting: Primary Care

## 2020-08-31 ENCOUNTER — Ambulatory Visit: Payer: Medicare Other

## 2020-09-03 ENCOUNTER — Other Ambulatory Visit: Payer: Self-pay | Admitting: Pulmonary Disease

## 2020-09-14 ENCOUNTER — Encounter: Payer: Self-pay | Admitting: Obstetrics and Gynecology

## 2020-09-14 ENCOUNTER — Encounter (INDEPENDENT_AMBULATORY_CARE_PROVIDER_SITE_OTHER): Payer: Self-pay

## 2020-09-14 ENCOUNTER — Other Ambulatory Visit: Payer: Self-pay

## 2020-09-14 ENCOUNTER — Inpatient Hospital Stay: Payer: Medicare Other | Attending: Obstetrics and Gynecology | Admitting: Obstetrics and Gynecology

## 2020-09-14 VITALS — BP 140/81 | HR 55 | Temp 97.8°F | Resp 18 | Wt 179.4 lb

## 2020-09-14 DIAGNOSIS — Z79890 Hormone replacement therapy (postmenopausal): Secondary | ICD-10-CM | POA: Insufficient documentation

## 2020-09-14 DIAGNOSIS — E559 Vitamin D deficiency, unspecified: Secondary | ICD-10-CM | POA: Diagnosis not present

## 2020-09-14 DIAGNOSIS — N183 Chronic kidney disease, stage 3 unspecified: Secondary | ICD-10-CM | POA: Insufficient documentation

## 2020-09-14 DIAGNOSIS — J449 Chronic obstructive pulmonary disease, unspecified: Secondary | ICD-10-CM | POA: Insufficient documentation

## 2020-09-14 DIAGNOSIS — I5022 Chronic systolic (congestive) heart failure: Secondary | ICD-10-CM | POA: Diagnosis not present

## 2020-09-14 DIAGNOSIS — F32A Depression, unspecified: Secondary | ICD-10-CM | POA: Insufficient documentation

## 2020-09-14 DIAGNOSIS — M3320 Polymyositis, organ involvement unspecified: Secondary | ICD-10-CM | POA: Insufficient documentation

## 2020-09-14 DIAGNOSIS — I130 Hypertensive heart and chronic kidney disease with heart failure and stage 1 through stage 4 chronic kidney disease, or unspecified chronic kidney disease: Secondary | ICD-10-CM | POA: Insufficient documentation

## 2020-09-14 DIAGNOSIS — C541 Malignant neoplasm of endometrium: Secondary | ICD-10-CM | POA: Diagnosis present

## 2020-09-14 DIAGNOSIS — I340 Nonrheumatic mitral (valve) insufficiency: Secondary | ICD-10-CM | POA: Insufficient documentation

## 2020-09-14 DIAGNOSIS — Z90710 Acquired absence of both cervix and uterus: Secondary | ICD-10-CM | POA: Insufficient documentation

## 2020-09-14 DIAGNOSIS — Z79899 Other long term (current) drug therapy: Secondary | ICD-10-CM | POA: Insufficient documentation

## 2020-09-14 DIAGNOSIS — I272 Pulmonary hypertension, unspecified: Secondary | ICD-10-CM | POA: Insufficient documentation

## 2020-09-14 DIAGNOSIS — Z90722 Acquired absence of ovaries, bilateral: Secondary | ICD-10-CM | POA: Insufficient documentation

## 2020-09-14 DIAGNOSIS — M1990 Unspecified osteoarthritis, unspecified site: Secondary | ICD-10-CM | POA: Diagnosis not present

## 2020-09-14 DIAGNOSIS — R001 Bradycardia, unspecified: Secondary | ICD-10-CM | POA: Insufficient documentation

## 2020-09-14 NOTE — Progress Notes (Signed)
Gynecologic Oncology Consult Visit   Referring Provider: Dr. Leafy Ro  Chief Concern: endometrial cancer, surveillance visit  Subjective:  Anna Romero is a 70 y.o. female seen in consultation from Dr. Leafy Ro for endometrial cancer, s/p Mirena IUD with persistent disease, s/p TLH-BSO who returns to clinic for post-op evaluation.   Doing well.  No new complaints.  Here for surveillance visit.   Gynecologic Oncology History:  Bleeding started and Pt called 09/01/2019 with first time PMB.  Saw Dr Leafy Ro in [place] who recommended D&C.  D&C/Hysteroscopy 10/12/19 Uterus measuring 7 cm by sound; normal cervix, vagina, perineum. Atrophic endometrium with 3 large masses. 2 appeared to be polyps and one was irregular and calcified. This one did not get removed entirely by the [facility] but a gritty texture was noted after curreting.   DIAGNOSIS:  A. ENDOMETRIUM; CURETTAGE:  - DISRUPTED POLYPOID FRAGMENTS OF ENDOMETRIOID ENDOMETRIAL ADENOCARCINOMA, LOW GRADE (WHO).  - PORTIONS OF BENIGN ECTOCERVICAL TISSUE.  - NO DEFINITIVE MYOMETRIAL TISSUE PRESENT TO EVALUATE FOR INVASION.   B. ENDOCERVIX; CURETTAGE:  - SUPERFICIAL FRAGMENTS OF BENIGN ENDOCERVICAL AND ECTOCERVICAL TISSUE.  - NEGATIVE FOR DYSPLASIA AND MALIGNANCY.   C. ENDOMETRIAL POLYP; CURETTAGE:  - DISRUPTED POLYPOID FRAGMENTS OF ENDOMETRIOID ENDOMETRIAL  ADENOCARCINOMA, LOW GRADE (WHO).  - NO DEFINITIVE MYOMETRIAL TISSUE PRESENT FOR EVALUATION OF INVASION.   Comment:  The tumor predominantly displays a glandular pattern, with minimal solid growth. The findings are intermediate between FIGO grade 1 and grade 2, with a FIGO grade 1 tumor favored in this fragmented specimen. Overall WHO classification would recommend defining grade 1 and 2 tumors as "low grade".     ADDENDUM:  Immunohistochemistry (IHC) Testing for DNA Mismatch Repair (MMR)  Proteins:  Results:  MLH1: Intact nuclear expression  MSH2: Intact nuclear expression  MSH6:  Intact nuclear expression  PMS2: Intact nuclear expression  IHC Interpretation: No loss of nuclear expression of MMR proteins: Low probability of MSI-H.   No further bleeding after hysteroscopy and D&C.  MRI of Abd 11/09/2019: Impression:Negative. No evidence of abdominal metastatic disease or other significant abnormality.   MRI of Pelvis 11/10/2019: Impression: 2 cm mass within the endometrial cavity, consistent with known endometrial carcinoma. This shows adjacent myometrial invasion to depth of <50%. (FIGO stage IA). No evidence of pelvic metastatic disease. Discussed with the radiologist.    Due to poor performance status and heart failure rehab, Mirena IUD was inserted by Dr. Leafy Ro on 11/17/19.  Performance status improved. Endometrial biopsy was repeated 01/20/20 and showed persistent grade 1 cancer.    In view of persistent disease and improved cardiac function surgery with TLH/BSO and SLN mapping and biopsies was performed at [facility] 03/22/20.    A. Left obturator sentinel lymph node, excisional biopsy:  Four lymph nodes, negative for malignancy (0/4).  B.  Uterus, cervix, bilateral fallopian tubes, and ovaries:  Cervix: No significant pathologic diagnosis.  Endomyometrium and serosa: Endometrial adenocarcinoma (1.5 cm), endometrioid type, grade 1 (of 3). Tumor invades through ~11% of the myometrial thickness (0.2 of 1.8 cm). No lymphovascular invasion is identified. The serosa is not involved. Leiomyomas. Adenomyosis.  MMR/MSI testing negative.   Right ovary: Simple cyst.  Right fallopian tube: No significant pathologic diagnosis.  Left ovary: Simple cyst. Left fallopian tube: No significant pathologic diagnosis.  Problem List: Patient Active Problem List   Diagnosis Date Noted  . Endometrial cancer ([place]) 10/28/2019  . Cough 08/03/2019  . Mild intermittent asthma 12/16/2018  . Mixed restrictive and obstructive lung disease ([place]) 12/16/2018  .  Depression  07/15/2018  . Polymyositis ([place]) 05/30/2017  . Chronic kidney insufficiency, stage 3 (moderate) (HCC) 03/26/2017  . Breast calcification, right 01/30/2017  . Polyarthralgia 10/12/2016  . Myalgia 10/12/2016  . Right elbow pain 10/12/2016  . Bradycardia 04/04/2016  . Chronic insomnia 03/31/2016  . Mild episode of recurrent major depressive disorder ([place]) 03/31/2016  . Cognitive impairment 03/26/2016  . Gastroesophageal reflux 09/23/2015  . 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 Moderate mitral insufficiency 05/24/2015  . Chronic systolic CHF (congestive heart failure), NYHA class 2 ([place]) 04/18/2015  . High risk medication use 03/21/2015  . Benign essential hypertension 10/01/2014  . Hypomagnesemia 09/08/2014  . Vitamin D deficiency, unspecified 09/08/2014  . Osteoarthritis 04/05/2014  . Degenerative disc disease, cervical 03/29/2014  . Hyperlipidemia, mixed 03/29/2014  . Monoclonal gammopathy 03/29/2014  . Pulmonary hypertension ([place]) 10/21/2013    Past Medical History: Past Medical History:  Diagnosis Date  . Anxiety   . Asthma   . CHF (congestive heart failure) ([place])   . Chronic kidney disease    Renal Insufficiency Stage 3  . Degenerative disc disease, cervical   . Depression   . GERD (gastroesophageal reflux disease)   . Hyperlipidemia   . Hypertension   . Mixed restrictive and obstructive lung disease ([place])    Predominantly restriction due to chronic volume loss in the left lung.  Mild small airways disease.  . Moderate mitral insufficiency   . Monoclonal gammopathy   . Polymyositis ([place])   . Pulmonary hypertension ([place])     Past Surgical History: Past Surgical History:  Procedure Laterality Date  . APPENDECTOMY    . BREAST EXCISIONAL BIOPSY Left 1990   negative  . CATARACT EXTRACTION    . COLONOSCOPY N/A 08/27/2016   Procedure: COLONOSCOPY;  Surgeon: Lollie Sails, MD;  Location[facility] ENDOSCOPY;  Service: Endoscopy;  Laterality: N/A;  .  COLONOSCOPY WITH PROPOFOL N/A 07/22/2018   Procedure: COLONOSCOPY WITH PROPOFOL;  Surgeon: Lollie Sails, MD;  Location: [facility] ENDOSCOPY;  Service: Endoscopy;  Laterality: N/A;  . HYSTEROSCOPY WITH D & C N/A 10/12/2019   Procedure: DILATATION AND CURETTAGE /HYSTEROSCOPY, POLYPECTOMY OR MYOMECTOMY;  Surgeon: Benjaman Kindler, MD;  Location: [facility] ORS;  Service: Gynecology;  Laterality: N/A;  . JOINT REPLACEMENT    . REPLACEMENT TOTAL HIP W/  RESURFACING IMPLANTS Right   . THYMECTOMY     Family History: Family History  Adopted: Yes  Problem Relation Age of Onset  . Breast cancer Neg Hx    Immunization History  Administered Date(s) Administered  . Influenza Inj Mdck Quad Pf 02/20/2016, 03/13/2019  . Influenza Split 02/18/2014  . Influenza, High Dose Seasonal PF 01/16/2018  . Influenza,inj,Quad PF,6+ Mos 02/22/2017  . Influenza-Unspecified 02/08/2012, 03/03/2013, 02/18/2014, 03/18/2015  . PFIZER(Purple Top)SARS-COV-2 Vaccination 07/16/2019, 08/12/2019  . Pneumococcal Polysaccharide-23 03/26/2016  . Rabies Immune Globulin 10/29/2015, 11/01/2015, 11/05/2015, 11/12/2015  . Rabies, IM 10/29/2015, 11/01/2015, 11/05/2015, 11/12/2015  . Td 12/25/1994  . Tdap 10/28/2015    Social History: Social History   Socioeconomic History  . Marital status: Legally Separated    Spouse name: matthew  . Number of children: 1  . Years of education: Not on file  . Highest education level: Associate degree: occupational, Hotel manager, or vocational program  Occupational History    Comment: disabled  Tobacco Use  . Smoking status: Never Smoker  . Smokeless tobacco: Never Used  Vaping Use  . Vaping Use: Never used  Substance and Sexual Activity  . Alcohol use: No  .  Drug use: No  . Sexual activity: Yes    Birth control/protection: None  Other Topics Concern  . Not on file  Social History Narrative  . Not on file   Social Determinants of Health   Financial Resource Strain: Not on file  Food  Insecurity: Not on file  Transportation Needs: Not on file  Physical Activity: Not on file  Stress: Not on file  Social Connections: Not on file  Intimate Partner Violence: Not on file    Allergies: Allergies  Allergen Reactions  . Hctz [Hydrochlorothiazide] Other (See Comments)    Kidney Disorder    Current Medications: Current Outpatient Medications  Medication Sig Dispense Refill  . acetaminophen (TYLENOL) 500 MG tablet Take 1,000 mg by mouth every 8 (eight) hours as needed for moderate pain.     Marland Kitchen albuterol (VENTOLIN HFA) 108 (90 Base) MCG/ACT inhaler Inhale 1-2 puffs into the lungs every 6 (six) hours as needed for wheezing or shortness of breath. 18 g 5  . buPROPion (WELLBUTRIN SR) 200 MG 12 hr tablet Take 200 mg by mouth every morning.     . carvedilol (COREG) 12.5 MG tablet Take 12.5 mg by mouth 2 (two) times daily with a meal.    . clonazePAM (KLONOPIN) 0.5 MG tablet Take 0.5 mg by mouth 2 (two) times daily as needed for anxiety.    . donepezil (ARICEPT) 10 MG tablet Take 10 mg by mouth at bedtime.    Marland Kitchen doxepin (SINEQUAN) 25 MG capsule Take 25-50 mg by mouth at bedtime as needed (sleep).     . furosemide (LASIX) 20 MG tablet Take 20 mg by mouth daily as needed for fluid.     . memantine (NAMENDA) 5 MG tablet Take 5 mg by mouth 2 (two) times daily.    . Multiple Vitamins-Minerals (MULTIVITAMIN WITH MINERALS) tablet Take 1 tablet by mouth daily.    Marland Kitchen omeprazole (PRILOSEC) 20 MG capsule Take 20 mg by mouth daily. (Patient not taking: Reported on 04/27/2020)    . spironolactone (ALDACTONE) 25 MG tablet Take 25 mg by mouth daily.    . SYMBICORT 160-4.5 MCG/ACT inhaler TAKE 2 PUFFS BY MOUTH TWICE A DAY 10.2 each 1  . telmisartan (MICARDIS) 40 MG tablet Take 40 mg by mouth daily.    . traZODone (DESYREL) 100 MG tablet Take 100-200 mg by mouth at bedtime as needed for sleep.  (Patient not taking: Reported on 04/27/2020)     No current facility-administered medications for this visit.    Review of Systems General:  no complaints Skin: no complaints Eyes: no complaints HEENT: no complaints Breasts: no complaints Pulmonary: no complaints Cardiac: no complaints Gastrointestinal: no complaints Genitourinary/Sexual: bladder Ob/Gyn: vulvar irritation Musculoskeletal: no complaints Hematology: no complaints Neurologic/Psych: no complaints  Objective:  Physical Examination:  BP 140/81   Pulse (!) 55   Temp 97.8 F (36.6 C)   Resp 18   Wt 179 lb 6.4 oz (81.4 kg)   SpO2 100%   BMI 28.96 kg/m    ECOG Performance Status: 2 - Symptomatic, <50% confined to bed  GENERAL: chronically ill appearing.  HEENT:  Sclera clear. Anicteric NODES:  Negative axillary, supraclavicular, inguinal lymph node survery LUNGS:  Clear to auscultation bilaterally.   HEART:  Regular rate and rhythm.  ABDOMEN:  Soft, nontender.  No hernias, incisions well healed. No masses or ascites EXTREMITIES:  No peripheral edema. Atraumatic. No cyanosis SKIN:  Clear with no obvious rashes or skin changes.  NEURO:  Nonfocal. Well  oriented.  Appropriate affect.  Pelvic: exam chaperoned by nursing, EGBUS within normal limits;  Vulva: normal appearing vulva with no masses, tenderness or lesions; Vagina: normal.  Adnexa, uterus, cervix: surgically absent. Rectal: deferred.  Lab Results  Component Value Date   WBC 8.4 08/12/2020   HGB 12.7 08/12/2020   HCT 39.8 08/12/2020   MCV 103.4 (H) 08/12/2020   PLT 187 08/12/2020     Chemistry      Component Value Date/Time   NA 139 08/12/2020 1731   NA 140 01/26/2014 0558   K 4.6 08/12/2020 1731   K 3.8 01/26/2014 0558   CL 106 08/12/2020 1731   CL 107 01/26/2014 0558   CO2 26 08/12/2020 1731   CO2 27 01/26/2014 0558   BUN 28 (H) 08/12/2020 1731   BUN 22 (H) 01/26/2014 0558   CREATININE 0.93 08/12/2020 1731   CREATININE 1.11 01/26/2014 0558      Component Value Date/Time   CALCIUM 9.8 08/12/2020 1731   CALCIUM 9.1 01/26/2014 0558   ALKPHOS 122  03/11/2012 1023   AST 35 03/11/2012 1023   ALT 37 03/11/2012 1023   BILITOT 0.4 03/11/2012 1023      Assessment:  MARGRIT MINNER is a 70 y.o. 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  Repeat biopsy 01/20/20 showed persistent grade 1 cancer and so had surgery 03/22/20 with TLH/BSO, SLN.  Tumor size 1.5 cm invading 23m in 18 mm myometrium, negative LVSI, cervix, serosa, tubes, ovaries, lymph nodes and washings. No adjuvant therapy.  NED  Immunohistochemistry (IHC) Testing for DNA Mismatch Repair (MMR) shows retained expression of all four proteins.  Medical co-morbidities complicating care:  CHF with significant deconditioning.   Moderate mitral insufficiency  Chronic kidney disease  Renal Insufficiency Stage 3 Depression  Hypertension  Mixed restrictive and obstructive lung disease ([place]: Predominantly restriction due to chronic volume loss in the left lung.  Mild small airways disease. Pulmonary hypertension multifactorial in nature with groupings including secondary causes of chf and valve disease.  Plan:   Problem List Items Addressed This Visit      Genitourinary   Endometrial cancer ([place] - Primary     We will see her back in 8 months and she will see Dr BLeafy Roin 4 months.   The patient's diagnosis, an outline of the further diagnostic and laboratory studies which will be required, the recommendation, and alternatives were discussed.  All questions were answered to the patient's satisfaction.  AMellody Drown MD   CC:  TEzequiel Kayser MD 1[facility]   [phone]

## 2020-09-21 ENCOUNTER — Ambulatory Visit: Payer: Medicare Other | Admitting: Pulmonary Disease

## 2020-11-03 ENCOUNTER — Other Ambulatory Visit: Payer: Self-pay | Admitting: Pulmonary Disease

## 2020-11-25 ENCOUNTER — Other Ambulatory Visit: Payer: Self-pay | Admitting: Pulmonary Disease

## 2021-01-12 ENCOUNTER — Telehealth: Payer: Self-pay | Admitting: *Deleted

## 2021-01-12 NOTE — Telephone Encounter (Signed)
Called and notified Anna Romero that she does need to see Dr. Leafy Ro. She will call today and make her appointment.

## 2021-01-12 NOTE — Telephone Encounter (Signed)
Pt left vm. She last saw Dr. Fransisca Connors in April and follows up with Dr. Fransisca Connors in December. pt wanted to know if she should follow-up with Dr. Leafy Ro prior to this apt. Steffanie Dunn will you review chart and call patient back with this. Thanks.

## 2021-03-25 ENCOUNTER — Emergency Department
Admission: EM | Admit: 2021-03-25 | Discharge: 2021-03-26 | Disposition: A | Discharge status: Home / Self Care | Payer: Medicare Other | Attending: Emergency Medicine | Admitting: Emergency Medicine

## 2021-03-25 ENCOUNTER — Other Ambulatory Visit: Payer: Self-pay

## 2021-03-25 ENCOUNTER — Emergency Department: Payer: Medicare Other

## 2021-03-25 ENCOUNTER — Encounter: Payer: Self-pay | Admitting: Emergency Medicine

## 2021-03-25 DIAGNOSIS — N183 Chronic kidney disease, stage 3 unspecified: Secondary | ICD-10-CM | POA: Insufficient documentation

## 2021-03-25 DIAGNOSIS — I130 Hypertensive heart and chronic kidney disease with heart failure and stage 1 through stage 4 chronic kidney disease, or unspecified chronic kidney disease: Secondary | ICD-10-CM | POA: Diagnosis not present

## 2021-03-25 DIAGNOSIS — Z79899 Other long term (current) drug therapy: Secondary | ICD-10-CM | POA: Insufficient documentation

## 2021-03-25 DIAGNOSIS — I509 Heart failure, unspecified: Secondary | ICD-10-CM | POA: Insufficient documentation

## 2021-03-25 DIAGNOSIS — Z8589 Personal history of malignant neoplasm of other organs and systems: Secondary | ICD-10-CM | POA: Diagnosis not present

## 2021-03-25 DIAGNOSIS — J4520 Mild intermittent asthma, uncomplicated: Secondary | ICD-10-CM | POA: Diagnosis not present

## 2021-03-25 DIAGNOSIS — W500XXA Accidental hit or strike by another person, initial encounter: Secondary | ICD-10-CM | POA: Diagnosis not present

## 2021-03-25 DIAGNOSIS — S098XXA Other specified injuries of head, initial encounter: Secondary | ICD-10-CM | POA: Insufficient documentation

## 2021-03-25 DIAGNOSIS — S0990XA Unspecified injury of head, initial encounter: Secondary | ICD-10-CM

## 2021-03-25 DIAGNOSIS — Z96643 Presence of artificial hip joint, bilateral: Secondary | ICD-10-CM | POA: Insufficient documentation

## 2021-03-25 LAB — COMPREHENSIVE METABOLIC PANEL
ALT: 19 U/L (ref 0–44)
AST: 21 U/L (ref 15–41)
Albumin: 4 g/dL (ref 3.5–5.0)
Alkaline Phosphatase: 72 U/L (ref 38–126)
Anion gap: 5 (ref 5–15)
BUN: 24 mg/dL — ABNORMAL HIGH (ref 8–23)
CO2: 28 mmol/L (ref 22–32)
Calcium: 9.5 mg/dL (ref 8.9–10.3)
Chloride: 106 mmol/L (ref 98–111)
Creatinine, Ser: 1.02 mg/dL — ABNORMAL HIGH (ref 0.44–1.00)
GFR, Estimated: 59 mL/min — ABNORMAL LOW (ref >60)
Glucose, Bld: 93 mg/dL (ref 70–99)
Potassium: 4.3 mmol/L (ref 3.5–5.1)
Sodium: 139 mmol/L (ref 135–145)
Total Bilirubin: 1 mg/dL (ref 0.3–1.2)
Total Protein: 7.3 g/dL (ref 6.5–8.1)

## 2021-03-25 LAB — DIFFERENTIAL
Abs Immature Granulocytes: 0.02 10*3/uL (ref 0.00–0.07)
Basophils Absolute: 0 10*3/uL (ref 0.0–0.1)
Basophils Relative: 0 %
Eosinophils Absolute: 0.1 10*3/uL (ref 0.0–0.5)
Eosinophils Relative: 2 %
Immature Granulocytes: 0 %
Lymphocytes Relative: 38 %
Lymphs Abs: 2.6 10*3/uL (ref 0.7–4.0)
Monocytes Absolute: 0.6 10*3/uL (ref 0.1–1.0)
Monocytes Relative: 8 %
Neutro Abs: 3.4 10*3/uL (ref 1.7–7.7)
Neutrophils Relative %: 52 %

## 2021-03-25 LAB — CBC
HCT: 39.7 % (ref 36.0–46.0)
Hemoglobin: 12.7 g/dL (ref 12.0–15.0)
MCH: 33.1 pg (ref 26.0–34.0)
MCHC: 32 g/dL (ref 30.0–36.0)
MCV: 103.4 fL — ABNORMAL HIGH (ref 80.0–100.0)
Platelets: 164 10*3/uL (ref 150–400)
RBC: 3.84 MIL/uL — ABNORMAL LOW (ref 3.87–5.11)
RDW: 12.3 % (ref 11.5–15.5)
WBC: 6.7 10*3/uL (ref 4.0–10.5)
nRBC: 0 % (ref 0.0–0.2)

## 2021-03-25 LAB — PROTIME-INR
INR: 1.1 (ref 0.8–1.2)
Prothrombin Time: 14.4 seconds (ref 11.4–15.2)

## 2021-03-25 LAB — APTT: aPTT: 34 seconds (ref 24–36)
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
## 2021-03-25 NOTE — ED Notes (Signed)
Pt is outside came to front desk reports she is going outside

## 2021-03-25 NOTE — ED Provider Notes (Signed)
Emergency Medicine Provider Triage Evaluation Note  Anna Romero , a 70 y.o. female  was evaluated in triage.  Pt complains of headache, head injury, dizziness, difficulty using left hand earlier today around 4 AM.  Review of Systems  Positive: Headache, head injury Negative: Chest pain, shortness of breath, fever chills, nausea vomit  Physical Exam  BP (!) 167/96 (BP Location: Left Arm)   Pulse (!) 51   Temp 98.5 F (36.9 C) (Oral)   Resp 18   Ht 5\' 10"  (1.778 m)   Wt 81.6 kg   SpO2 97%   BMI 25.83 kg/m  Gen:   Awake, no distress   Resp:  Normal effort  MSK:   Moves extremities without difficulty  Other:  Cranial nerves II through XII grossly intact, grips are equal bilaterally  Medical Decision Making  Medically screening exam initiated at 2:30 PM.  Appropriate orders placed.  Anna Romero was informed that the remainder of the evaluation will be completed by another provider, this initial triage assessment does not replace that evaluation, and the importance of remaining in the ED until their evaluation is complete.     Versie Starks, PA-C 03/25/21 1431    Naaman Plummer, MD 03/25/21 [phone]
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
## 2021-03-26 NOTE — Discharge Instructions (Addendum)
You may take over-the-counter Tylenol 1000 mg every 6 hours as needed for pain.   You have had a head injury resulting in a concussion.  Please avoid alcohol, sedatives for the next week.  Please rest and drink plenty of water.  We recommend that you avoid any activity that may lead to another head injury for at least 1 week or until your symptoms have completely resolved.  We also recommend "brain rest" - please avoid TV, cell phones, tablets, computers as much as possible for the next 48 hours.     CT of your head showed no traumatic injury.

## 2021-05-17 ENCOUNTER — Telehealth: Payer: Self-pay | Admitting: Obstetrics and Gynecology

## 2021-05-17 ENCOUNTER — Inpatient Hospital Stay: Payer: Medicare Other

## 2021-05-17 NOTE — Telephone Encounter (Signed)
Pt called to cancel her appt for today. Call back at [phone]

## 2021-05-23 ENCOUNTER — Ambulatory Visit
Admission: RE | Admit: 2021-05-23 | Discharge: 2021-05-23 | Disposition: A | Discharge status: Home / Self Care | Payer: Medicare Other | Source: Ambulatory Visit | Attending: Pulmonary Disease | Admitting: Pulmonary Disease

## 2021-05-23 ENCOUNTER — Encounter: Payer: Self-pay | Admitting: Pulmonary Disease

## 2021-05-23 ENCOUNTER — Ambulatory Visit
Admission: RE | Admit: 2021-05-23 | Discharge: 2021-05-23 | Disposition: A | Discharge status: Home / Self Care | Payer: Medicare Other | Attending: Pulmonary Disease | Admitting: Pulmonary Disease

## 2021-05-23 ENCOUNTER — Ambulatory Visit (INDEPENDENT_AMBULATORY_CARE_PROVIDER_SITE_OTHER): Payer: Medicare Other | Admitting: Pulmonary Disease

## 2021-05-23 ENCOUNTER — Other Ambulatory Visit: Payer: Self-pay

## 2021-05-23 ENCOUNTER — Other Ambulatory Visit (HOSPITAL_COMMUNITY): Payer: Self-pay

## 2021-05-23 ENCOUNTER — Telehealth: Payer: Self-pay

## 2021-05-23 VITALS — BP 118/80 | HR 55 | Temp 98.2°F | Ht 66.0 in | Wt 184.0 lb

## 2021-05-23 DIAGNOSIS — J4521 Mild intermittent asthma with (acute) exacerbation: Secondary | ICD-10-CM

## 2021-05-23 DIAGNOSIS — R051 Acute cough: Secondary | ICD-10-CM

## 2021-05-23 DIAGNOSIS — J4520 Mild intermittent asthma, uncomplicated: Secondary | ICD-10-CM | POA: Insufficient documentation

## 2021-05-23 DIAGNOSIS — I2721 Secondary pulmonary arterial hypertension: Secondary | ICD-10-CM

## 2021-05-23 MED ORDER — METHYLPREDNISOLONE 4 MG PO TBPK
ORAL_TABLET | ORAL | 0 refills | Status: DC
  Filled 2021-05-23: qty 21, 6d supply, fill #0

## 2021-05-23 MED ORDER — TRELEGY ELLIPTA 100-62.5-25 MCG/ACT IN AEPB: 100.0000 ug | INHALATION_SPRAY | Freq: Every day | RESPIRATORY_TRACT | 0 refills | Status: AC

## 2021-05-23 MED ORDER — METHYLPREDNISOLONE 4 MG PO TBPK: ORAL_TABLET | ORAL | 0 refills | Status: DC

## 2021-05-23 NOTE — Progress Notes (Signed)
Subjective:    Patient ID: Anna Romero, female    DOB: [birth year], 71 y.o.   MRN: [id number] Chief Complaint  Patient presents with   Follow-up    Asthma  Requesting MD/Service: Raechel Ache, current PCP Gladstone Lighter.MD Date of initial consultation: 11/19/17, by Dr. Alva Garnet Reason for consultation: H/O asthma, ? H/O OSA, PAH   PT PROFILE: 71 y.o. female never smoker with documented h/o OSA (though PSG in 2016 revealed no OSA), documented h/o asthma and PAH by echocardiogram. 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 Previously followed for pulmonary issues by Dr Raul Del   DATA: PFTs 10/06/14 Jefm Bryant): FVC 2.64  (110% predicted),  FEV1 1.71 L  (88% predicted), FEV1/FVC 64%, TLC (77% pred), DLCO (72% pred), DLCO/VA (102% pred) PSG 03/02/15: no significant apnea. PLM index 9.1, PLM arousal index 1.7 Echo 05/24/15: LVEF 40%, global HK, moderate MR, RVSP 45 mmHg CT chest 12/02/15: Left lower lobe scarring with associated pleural thickening and trace pleural effusion, chronic. No evidence of acute cardiopulmonary disease Echo 07/09/17: LVEF 40%, global HK, moderate MR, RVSP 43 mmHg Overnight oximetry 11/2017: No significant nocturnal desaturations PFTs 01/14/2018: FVC: 2.07 > 2.12 L (66 > 68 %pred), FEV1: 1.40 > 1.49 L (56 > 60 %pred), FEV1/FVC: 68%, TLC: 3.54 L (68 %pred), DLCO 41 %pred, DLCO/VA 102% predicted.  No significant response to bronchodilator except for 34% improvement in FEF 25-75%     INTERVAL: Last seen 09/10/2019 by me, never followed up as instructed.   HPI Patient is a 71 year old lifelong never smoker with history of asthma who presents for follow-up.  She has run out of her asthma medications over the last few days has noted runny nose and some increased shortness of breath.  She was evaluated at urgent care on January due to the symptoms COVID test was negative.  Was instructed to drink fluids and just given general instructions for  upper respiratory infection.  Then has noted increasing wheezing chest tightness and cough productive of what appears to be mucous plugs by her description.  These are cylindrical and gray.  These are likely bronchial casts.  She has issues with nonischemic cardiomyopathy and mitral regurgitation is to follow with Dr. Nehemiah Massed next week.  She has not had any fevers, chills or sweats.  No hemoptysis.  Does not endorse any other issues.  Having some difficulties at home due to marital discord she appears somewhat preoccupied and distracted today.   Review of Systems A 10 point review of systems was performed and it is as noted above otherwise negative.  Past Medical History:  Diagnosis Date   Anxiety    Asthma    CHF (congestive heart failure) (HCC)    Chronic kidney disease    Renal Insufficiency Stage 3   Degenerative disc disease, cervical    Depression    GERD (gastroesophageal reflux disease)    Hyperlipidemia    Hypertension    Mixed restrictive and obstructive lung disease (HCC)    Predominantly restriction due to chronic volume loss in the left lung.  Mild small airways disease.   Moderate mitral insufficiency    Monoclonal gammopathy    Polymyositis (HCC)    Pulmonary hypertension ([place])    Past Surgical History:  Procedure Laterality Date   APPENDECTOMY     BREAST EXCISIONAL BIOPSY Left 1990   negative   CATARACT EXTRACTION     COLONOSCOPY N/A 08/27/2016   Procedure: COLONOSCOPY;  Surgeon: Lollie Sails,  MD;  Location: [facility] ENDOSCOPY;  Service: Endoscopy;  Laterality: N/A;   COLONOSCOPY WITH PROPOFOL N/A 07/22/2018   Procedure: COLONOSCOPY WITH PROPOFOL;  Surgeon: Lollie Sails, MD;  Location: [facility] ENDOSCOPY;  Service: Endoscopy;  Laterality: N/A;   HYSTEROSCOPY WITH D & C N/A 10/12/2019   Procedure: DILATATION AND CURETTAGE /HYSTEROSCOPY, POLYPECTOMY OR MYOMECTOMY;  Surgeon: Benjaman Kindler, MD;  Location: [facility] ORS;  Service: Gynecology;  Laterality: N/A;   JOINT  REPLACEMENT     REPLACEMENT TOTAL HIP W/  RESURFACING IMPLANTS Right    THYMECTOMY     Family History  Adopted: Yes  Problem Relation Age of Onset   Breast cancer Neg Hx    Social History   Tobacco Use   Smoking status: Never   Smokeless tobacco: Never  Substance Use Topics   Alcohol use: No   Current Meds  Medication Sig   acetaminophen (TYLENOL) 500 MG tablet Take 1,000 mg by mouth every 8 (eight) hours as needed for moderate pain.    albuterol (VENTOLIN HFA) 108 (90 Base) MCG/ACT inhaler Inhale 1-2 puffs into the lungs every 6 (six) hours as needed for wheezing or shortness of breath.   buPROPion (WELLBUTRIN SR) 200 MG 12 hr tablet Take 200 mg by mouth every morning.    carvedilol (COREG) 12.5 MG tablet Take 12.5 mg by mouth 2 (two) times daily with a meal.   clonazePAM (KLONOPIN) 0.5 MG tablet Take 0.5 mg by mouth 2 (two) times daily as needed for anxiety.   donepezil (ARICEPT) 10 MG tablet Take 10 mg by mouth at bedtime.   doxepin (SINEQUAN) 25 MG capsule Take 25-50 mg by mouth at bedtime as needed (sleep).    furosemide (LASIX) 20 MG tablet Take 20 mg by mouth daily as needed for fluid.    memantine (NAMENDA) 5 MG tablet Take 5 mg by mouth 2 (two) times daily.   Multiple Vitamins-Minerals (MULTIVITAMIN WITH MINERALS) tablet Take 1 tablet by mouth daily.   omeprazole (PRILOSEC) 20 MG capsule Take 20 mg by mouth daily.   spironolactone (ALDACTONE) 25 MG tablet Take 25 mg by mouth daily.   SYMBICORT 160-4.5 MCG/ACT inhaler TAKE 2 PUFFS BY MOUTH TWICE A DAY   telmisartan (MICARDIS) 40 MG tablet Take 40 mg by mouth daily.   traZODone (DESYREL) 100 MG tablet Take 100-200 mg by mouth at bedtime as needed for sleep.    Immunization History  Administered Date(s) Administered   Influenza Inj Mdck Quad Pf 02/20/2016, 03/13/2019   Influenza Split 02/18/2014   Influenza, High Dose Seasonal PF 02/20/2016, 01/16/2018, 03/13/2019   Influenza,inj,Quad PF,6+ Mos 02/22/2017    Influenza-Unspecified 02/08/2012, 03/03/2013, 02/18/2014, 03/18/2015, 02/22/2017, 03/08/2020   PFIZER Comirnaty(Gray Top)Covid-19 Tri-Sucrose Vaccine 07/16/2019, 08/12/2019, 02/27/2020   PFIZER(Purple Top)SARS-COV-2 Vaccination 07/16/2019, 08/12/2019   Pneumococcal Polysaccharide-23 03/26/2016   Rabies Immune Globulin 10/29/2015, 11/01/2015, 11/05/2015, 11/12/2015   Rabies, IM 10/29/2015, 11/01/2015, 11/05/2015, 11/12/2015   Td 12/25/1994   Tdap 10/28/2015       Objective:   Physical Exam BP 118/80 (BP Location: Left Arm, Patient Position: Sitting, Cuff Size: Normal)    Pulse (!) 55    Temp 98.2 F (36.8 C) (Oral)    Ht 5\' 6"  (1.676 m)    Wt 184 lb (83.5 kg)    SpO2 97%    BMI 29.70 kg/m  GENERAL: Awake, alert, fully ambulatory.  No respiratory distress.  She is poorly kempt and seems somewhat depressed. HEAD: Normocephalic, atraumatic.  EYES: Pupils equal, round, reactive to light.  No scleral icterus.  MOUTH: Nose/mouth/throat not examined due to masking requirements for COVID 19. NECK: Supple. No thyromegaly. No nodules. No JVD.  PULMONARY: Coarse breath sounds, faint end expiratory wheezes.  No other adventitious sounds. CARDIOVASCULAR: S1 and S2. Regular rate and rhythm.  Grade 2/6 systolic ejection murmur at the axilla consistent with mitral regurgitation. GASTROINTESTINAL: Nondistended MUSCULOSKELETAL: No joint deformity, no clubbing, no edema.  NEUROLOGIC: No overt focal deficits noted.  No gait disturbance noted on ambulation. SKIN: Intact,warm,dry.  On limited exam no rashes. PSYCH: Flat affect, pressed.   Chest x-ray performed today shows no acute abnormality.  She has chronic left lower lobe scarring:     Assessment & Plan:     ICD-10-CM   1. Mild intermittent asthma with acute exacerbation  J45.21 DG Chest 2 View   Negative COVID test Treat with Medrol Dosepak Trial of Trelegy Ellipta    2. Acute cough  R05.1    Likely due to asthma exacerbation Treatment as  above    3. Mild-mod PAH by echocardiogram - group 2 (due to L heart disease)  I27.21    This issue adds complexity to her management Does have mitral disease Follows with cardiology Appointment next week with Dr. Nehemiah Massed     Orders Placed This Encounter  Procedures   DG Chest 2 View    PA AND LATERAL CXR    Standing Status:   Future    Number of Occurrences:   1    Standing Expiration Date:   05/23/2022    Order Specific Question:   Reason for Exam (SYMPTOM  OR DIAGNOSIS REQUIRED)    Answer:   Asthma    Order Specific Question:   Preferred imaging location?    Answer:   [facility] ordered this encounter  Medications   Fluticasone-Umeclidin-Vilant (TRELEGY ELLIPTA) 100-62.5-25 MCG/ACT AEPB    Sig: Inhale 100 mcg into the lungs daily.    Dispense:  28 each    Refill:  0    Order Specific Question:   Lot Number?    Answer:   [id number]    Order Specific Question:   Expiration Date?    Answer:   12/19/2022    Order Specific Question:   Quantity    Answer:   2   methylPREDNISolone (MEDROL DOSEPAK) 4 MG TBPK tablet    Sig: Take as directed in the package.  This is a taper pack.    Dispense:  21 tablet    Refill:  0   We will see the patient in follow-up in 3 to 4 weeks time with either me or the nurse practitioner.  She is to contact us prior to that time should any new difficulties arise.  When to schedule for PFTs on follow-up if her symptoms have subsided.  Renold Don, MD Advanced Bronchoscopy [facility]    *This note was dictated using voice recognition software/Dragon.  Despite best efforts to proofread, errors can occur which can change the meaning. Any transcriptional errors that result from this process are unintentional and may not be fully corrected at the time of dictation.

## 2021-05-23 NOTE — Patient Instructions (Signed)
We are going to get a chest x-ray today.  We have sent a prescription to your pharmacy for a taper of Medrol which is a prednisone like medication.  Take it as directed in the package.  We are giving you a trial of Trelegy Ellipta that this is 1 puff daily make sure you rinse your mouth well after you use it.  Let us know how you do with this medication so we can call it in for you.  DO NOT USE THE SYMBICORT WHILE ON THE TRELEGY.  You may still use your albuterol (rescue inhaler) if needed.  Make sure you keep up your upcoming appointment with Dr. Nehemiah Massed.  We will see you in follow-up in 3 to 4 weeks time call sooner should any new problems arise.  You may see me or the nurse practitioner at that time.

## 2021-05-23 NOTE — Telephone Encounter (Signed)
Called to find out patients reason for appointment, had to leave voicemail.

## 2021-05-25 ENCOUNTER — Encounter: Payer: Self-pay | Admitting: Pulmonary Disease

## 2021-05-31 ENCOUNTER — Ambulatory Visit: Payer: Medicare Other | Admitting: Dermatology

## 2021-06-14 ENCOUNTER — Inpatient Hospital Stay: Payer: Medicare Other | Attending: Obstetrics and Gynecology | Admitting: Obstetrics and Gynecology

## 2021-06-14 ENCOUNTER — Other Ambulatory Visit: Payer: Self-pay

## 2021-06-14 VITALS — BP 125/67 | HR 59 | Temp 98.7°F | Resp 20 | Wt 183.9 lb

## 2021-06-14 DIAGNOSIS — N183 Chronic kidney disease, stage 3 unspecified: Secondary | ICD-10-CM | POA: Insufficient documentation

## 2021-06-14 DIAGNOSIS — Z7951 Long term (current) use of inhaled steroids: Secondary | ICD-10-CM | POA: Diagnosis not present

## 2021-06-14 DIAGNOSIS — I130 Hypertensive heart and chronic kidney disease with heart failure and stage 1 through stage 4 chronic kidney disease, or unspecified chronic kidney disease: Secondary | ICD-10-CM | POA: Diagnosis not present

## 2021-06-14 DIAGNOSIS — Z9079 Acquired absence of other genital organ(s): Secondary | ICD-10-CM | POA: Insufficient documentation

## 2021-06-14 DIAGNOSIS — Z7952 Long term (current) use of systemic steroids: Secondary | ICD-10-CM | POA: Insufficient documentation

## 2021-06-14 DIAGNOSIS — F32A Depression, unspecified: Secondary | ICD-10-CM | POA: Diagnosis not present

## 2021-06-14 DIAGNOSIS — I5022 Chronic systolic (congestive) heart failure: Secondary | ICD-10-CM | POA: Insufficient documentation

## 2021-06-14 DIAGNOSIS — C541 Malignant neoplasm of endometrium: Secondary | ICD-10-CM

## 2021-06-14 DIAGNOSIS — Z90710 Acquired absence of both cervix and uterus: Secondary | ICD-10-CM | POA: Diagnosis not present

## 2021-06-14 DIAGNOSIS — Z8542 Personal history of malignant neoplasm of other parts of uterus: Secondary | ICD-10-CM | POA: Diagnosis not present

## 2021-06-14 DIAGNOSIS — Z79899 Other long term (current) drug therapy: Secondary | ICD-10-CM | POA: Diagnosis not present

## 2021-06-14 DIAGNOSIS — Z90722 Acquired absence of ovaries, bilateral: Secondary | ICD-10-CM | POA: Insufficient documentation

## 2021-06-14 NOTE — Patient Instructions (Signed)
Please contact Dr. Migdalia Dk office to schedule a visit in 4 months (around Oct 12, 2021). We will se eyou back in 8 months.

## 2021-06-14 NOTE — Progress Notes (Signed)
Gynecologic Oncology Consult Visit   Referring Provider: Dr. Leafy Ro  Chief Concern: endometrial cancer, surveillance visit  Subjective:  Anna Romero is a 71 y.o. female seen in consultation from Dr. Leafy Ro for endometrial cancer, s/p Mirena IUD with persistent disease, s/p TLH-BSO who returns to clinic for post-op evaluation.   Doing well.  Some discomfort with intercourse and Dr Leafy Ro prescribed lubricant.  Some itching on vulva.  Here for surveillance visit. No bleeding or discharge.   Gynecologic Oncology History:  Bleeding started and Pt called 09/01/2019 with first time PMB.  Saw Dr Leafy Ro in [place] who recommended D&C.  D&C/Hysteroscopy 10/12/19 Uterus measuring 7 cm by sound; normal cervix, vagina, perineum. Atrophic endometrium with 3 large masses. 2 appeared to be polyps and one was irregular and calcified. This one did not get removed entirely by the [facility] but a gritty texture was noted after curreting.   DIAGNOSIS:  A. ENDOMETRIUM; CURETTAGE:  - DISRUPTED POLYPOID FRAGMENTS OF ENDOMETRIOID ENDOMETRIAL ADENOCARCINOMA, LOW GRADE (WHO).  - PORTIONS OF BENIGN ECTOCERVICAL TISSUE.  - NO DEFINITIVE MYOMETRIAL TISSUE PRESENT TO EVALUATE FOR INVASION.   B. ENDOCERVIX; CURETTAGE:  - SUPERFICIAL FRAGMENTS OF BENIGN ENDOCERVICAL AND ECTOCERVICAL TISSUE.  - NEGATIVE FOR DYSPLASIA AND MALIGNANCY.   C. ENDOMETRIAL POLYP; CURETTAGE:  - DISRUPTED POLYPOID FRAGMENTS OF ENDOMETRIOID ENDOMETRIAL  ADENOCARCINOMA, LOW GRADE (WHO).  - NO DEFINITIVE MYOMETRIAL TISSUE PRESENT FOR EVALUATION OF INVASION.   Comment:  The tumor predominantly displays a glandular pattern, with minimal solid growth. The findings are intermediate between FIGO grade 1 and grade 2, with a FIGO grade 1 tumor favored in this fragmented specimen. Overall WHO classification would recommend defining grade 1 and 2 tumors as "low grade".     ADDENDUM:  Immunohistochemistry (IHC) Testing for DNA Mismatch Repair (MMR)   Proteins:  Results:  MLH1: Intact nuclear expression  MSH2: Intact nuclear expression  MSH6: Intact nuclear expression  PMS2: Intact nuclear expression  IHC Interpretation: No loss of nuclear expression of MMR proteins: Low probability of MSI-H.   No further bleeding after hysteroscopy and D&C.  MRI of Abd 11/09/2019: Impression:Negative. No evidence of abdominal metastatic disease or other significant abnormality.    MRI of Pelvis 11/10/2019: Impression: 2 cm mass within the endometrial cavity, consistent with known endometrial carcinoma. This shows adjacent myometrial invasion to depth of <50%. (FIGO stage IA). No evidence of pelvic metastatic disease. Discussed with the radiologist.    Due to poor performance status and heart failure rehab, Mirena IUD was inserted by Dr. Leafy Ro on 11/17/19.  Performance status improved. Endometrial biopsy was repeated 01/20/20 and showed persistent grade 1 cancer.    In view of persistent disease and improved cardiac function surgery with TLH/BSO and SLN mapping and biopsies was performed at [facility] 03/22/20.    A. Left obturator sentinel lymph node, excisional biopsy:   Four lymph nodes, negative for malignancy (0/4).   B.  Uterus, cervix, bilateral fallopian tubes, and ovaries:   Cervix: No significant pathologic diagnosis.   Endomyometrium and serosa: Endometrial adenocarcinoma (1.5 cm), endometrioid type, grade 1 (of 3). Tumor invades through ~11% of the myometrial thickness (0.2 of 1.8 cm). No lymphovascular invasion is identified. The serosa is not involved. Leiomyomas. Adenomyosis.   MMR/MSI testing negative.   Right ovary: Simple cyst.   Right fallopian tube: No significant pathologic diagnosis.   Left ovary: Simple cyst. Left fallopian tube: No significant pathologic diagnosis.  Problem List: Patient Active Problem List   Diagnosis Date Noted   Endometrial  cancer ([place]) 10/28/2019   Cough 08/03/2019   Mild intermittent asthma  12/16/2018   Mixed restrictive and obstructive lung disease ([place]) 12/16/2018   Depression 07/15/2018   Polymyositis ([place]) 05/30/2017   Chronic kidney insufficiency, stage 3 (moderate) ([place]) 03/26/2017   Breast calcification, right 01/30/2017   Polyarthralgia 10/12/2016   Myalgia 10/12/2016   Right elbow pain 10/12/2016   Bradycardia 04/04/2016   Chronic insomnia 03/31/2016   Mild episode of recurrent major depressive disorder ([place]) 03/31/2016   Cognitive impairment 03/26/2016   Gastroesophageal reflux 09/23/2015   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   Moderate mitral insufficiency 35/32/9924   Chronic systolic CHF (congestive heart failure), NYHA class 2 ([place]) 04/18/2015   High risk medication use 03/21/2015   Benign essential hypertension 10/01/2014   Hypomagnesemia 09/08/2014   Vitamin D deficiency, unspecified 09/08/2014   Osteoarthritis 04/05/2014   Degenerative disc disease, cervical 03/29/2014   Hyperlipidemia, mixed 03/29/2014   Monoclonal gammopathy 03/29/2014   Pulmonary hypertension ([place]) 10/21/2013    Past Medical History: Past Medical History:  Diagnosis Date   Anxiety    Asthma    CHF (congestive heart failure) ([place])    Chronic kidney disease    Renal Insufficiency Stage 3   Degenerative disc disease, cervical    Depression    GERD (gastroesophageal reflux disease)    Hyperlipidemia    Hypertension    Mixed restrictive and obstructive lung disease ([place])    Predominantly restriction due to chronic volume loss in the left lung.  Mild small airways disease.   Moderate mitral insufficiency    Monoclonal gammopathy    Polymyositis (HCC)    Pulmonary hypertension (HCC)     Past Surgical History: Past Surgical History:  Procedure Laterality Date   APPENDECTOMY     BREAST EXCISIONAL BIOPSY Left 1990   negative   CATARACT EXTRACTION     COLONOSCOPY N/A 08/27/2016   Procedure: COLONOSCOPY;  Surgeon: Lollie Sails, MD;  Location: [facility]  ENDOSCOPY;  Service: Endoscopy;  Laterality: N/A;   COLONOSCOPY WITH PROPOFOL N/A 07/22/2018   Procedure: COLONOSCOPY WITH PROPOFOL;  Surgeon: Lollie Sails, MD;  Location: [facility] ENDOSCOPY;  Service: Endoscopy;  Laterality: N/A;   HYSTEROSCOPY WITH D & C N/A 10/12/2019   Procedure: DILATATION AND CURETTAGE /HYSTEROSCOPY, POLYPECTOMY OR MYOMECTOMY;  Surgeon: Benjaman Kindler, MD;  Location: [facility] ORS;  Service: Gynecology;  Laterality: N/A;   JOINT REPLACEMENT     REPLACEMENT TOTAL HIP W/  RESURFACING IMPLANTS Right    THYMECTOMY     Family History: Family History  Adopted: Yes  Problem Relation Age of Onset   Breast cancer Neg Hx    Immunization History  Administered Date(s) Administered   Influenza Inj Mdck Quad Pf 02/20/2016, 03/13/2019   Influenza Split 02/18/2014   Influenza, High Dose Seasonal PF 02/20/2016, 01/16/2018, 03/13/2019   Influenza,inj,Quad PF,6+ Mos 02/22/2017   Influenza-Unspecified 02/08/2012, 03/03/2013, 02/18/2014, 03/18/2015, 02/22/2017, 03/08/2020   PFIZER Comirnaty(Gray Top)Covid-19 Tri-Sucrose Vaccine 07/16/2019, 08/12/2019, 02/27/2020   PFIZER(Purple Top)SARS-COV-2 Vaccination 07/16/2019, 08/12/2019   Pneumococcal Polysaccharide-23 03/26/2016   Rabies Immune Globulin 10/29/2015, 11/01/2015, 11/05/2015, 11/12/2015   Rabies, IM 10/29/2015, 11/01/2015, 11/05/2015, 11/12/2015   Td 12/25/1994   Tdap 10/28/2015    Social History: Social History   Socioeconomic History   Marital status: Legally Separated    Spouse name: matthew   Number of children: 1   Years of education: Not on file   Highest education level: Associate degree: occupational, Hotel manager, or vocational program  Occupational History  Comment: disabled  Tobacco Use   Smoking status: Never   Smokeless tobacco: Never  Vaping Use   Vaping Use: Never used  Substance and Sexual Activity   Alcohol use: No   Drug use: No   Sexual activity: Yes    Birth control/protection: None  Other  Topics Concern   Not on file  Social History Narrative   Not on file   Social Determinants of Health   Financial Resource Strain: Not on file  Food Insecurity: Not on file  Transportation Needs: Not on file  Physical Activity: Not on file  Stress: Not on file  Social Connections: Not on file  Intimate Partner Violence: Not on file    Allergies: Allergies  Allergen Reactions   Hctz [Hydrochlorothiazide] Other (See Comments)    Kidney Disorder    Current Medications: Current Outpatient Medications  Medication Sig Dispense Refill   acetaminophen (TYLENOL) 500 MG tablet Take 1,000 mg by mouth every 8 (eight) hours as needed for moderate pain.      albuterol (VENTOLIN HFA) 108 (90 Base) MCG/ACT inhaler Inhale 1-2 puffs into the lungs every 6 (six) hours as needed for wheezing or shortness of breath. 18 g 5   buPROPion (WELLBUTRIN SR) 200 MG 12 hr tablet Take 200 mg by mouth every morning.      clonazePAM (KLONOPIN) 0.5 MG tablet Take 0.5 mg by mouth 2 (two) times daily as needed for anxiety.     donepezil (ARICEPT) 10 MG tablet Take 10 mg by mouth at bedtime.     Fluticasone-Umeclidin-Vilant (TRELEGY ELLIPTA) 100-62.5-25 MCG/ACT AEPB Inhale 100 mcg into the lungs daily. 28 each 0   furosemide (LASIX) 20 MG tablet Take 20 mg by mouth daily as needed for fluid.      memantine (NAMENDA) 5 MG tablet Take 5 mg by mouth 2 (two) times daily.     methylPREDNISolone (MEDROL DOSEPAK) 4 MG TBPK tablet Take as directed in the package.  This is a taper pack. 21 tablet 0   Multiple Vitamins-Minerals (MULTIVITAMIN WITH MINERALS) tablet Take 1 tablet by mouth daily.     spironolactone (ALDACTONE) 25 MG tablet Take 25 mg by mouth daily.     Suvorexant (BELSOMRA PO) Take by mouth.     SYMBICORT 160-4.5 MCG/ACT inhaler TAKE 2 PUFFS BY MOUTH TWICE A DAY 10.2 each 1   telmisartan (MICARDIS) 40 MG tablet Take 40 mg by mouth daily.     No current facility-administered medications for this visit.    Review of Systems General:  no complaints Skin: no complaints Eyes: no complaints HEENT: no complaints Breasts: no complaints Pulmonary: no complaints Cardiac: no complaints Gastrointestinal: no complaints Genitourinary/Sexual: bladder Ob/Gyn: vulvar irritation Musculoskeletal: no complaints Hematology: no complaints Neurologic/Psych: no complaints  Objective:  Physical Examination:  BP 125/67    Pulse (!) 59    Temp 98.7 F (37.1 C)    Resp 20    Wt 183 lb 14.4 oz (83.4 kg)    SpO2 100%    BMI 29.68 kg/m    ECOG Performance Status: 2 - Symptomatic, <50% confined to bed  GENERAL: chronically ill appearing.  HEENT:  Sclera clear. Anicteric NODES:  Negative axillary, supraclavicular, inguinal lymph node survery LUNGS:  Clear to auscultation bilaterally.   HEART:  Regular rate and rhythm.  ABDOMEN:  Soft, nontender.  No hernias, incisions well healed. No masses or ascites EXTREMITIES:  No peripheral edema. Atraumatic. No cyanosis SKIN:  Clear with no obvious rashes or skin changes.  NEURO:  Nonfocal. Well oriented.  Appropriate affect.  Pelvic: exam chaperoned by nursing, EGBUS within normal limits;  Vulva: labia majora a bit thickened, no masses, tenderness or lesions; Vagina: normal.  Adnexa, uterus, cervix: surgically absent. Rectal: deferred.  Lab Results  Component Value Date   WBC 6.7 03/25/2021   HGB 12.7 03/25/2021   HCT 39.7 03/25/2021   MCV 103.4 (H) 03/25/2021   PLT 164 03/25/2021     Chemistry      Component Value Date/Time   NA 139 03/25/2021 1440   NA 140 01/26/2014 0558   K 4.3 03/25/2021 1440   K 3.8 01/26/2014 0558   CL 106 03/25/2021 1440   CL 107 01/26/2014 0558   CO2 28 03/25/2021 1440   CO2 27 01/26/2014 0558   BUN 24 (H) 03/25/2021 1440   BUN 22 (H) 01/26/2014 0558   CREATININE 1.02 (H) 03/25/2021 1440   CREATININE 1.11 01/26/2014 0558      Component Value Date/Time   CALCIUM 9.5 03/25/2021 1440   CALCIUM 9.1 01/26/2014 0558    ALKPHOS 72 03/25/2021 1440   ALKPHOS 122 03/11/2012 1023   AST 21 03/25/2021 1440   AST 35 03/11/2012 1023   ALT 19 03/25/2021 1440   ALT 37 03/11/2012 1023   BILITOT 1.0 03/25/2021 1440   BILITOT 0.4 03/11/2012 1023      Assessment:  JOSIANE LABINE is a 71 y.o. 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  Repeat biopsy 01/20/20 showed persistent grade 1 cancer and so had surgery 03/22/20 with TLH/BSO, SLN.  Tumor size 1.5 cm invading 38m in 18 mm myometrium, negative LVSI, cervix, serosa, tubes, ovaries, lymph nodes and washings. No adjuvant therapy.  NED  Immunohistochemistry (IHC) Testing for DNA Mismatch Repair (MMR) shows retained expression of all four proteins.  Using lubricants for dyspareunia with vaginal atrophy. Possible yeast infection on labia majora.  Medical co-morbidities complicating care:  CHF with significant deconditioning.    Moderate mitral insufficiency  Chronic kidney disease   Renal Insufficiency Stage 3 Depression   Hypertension   Mixed restrictive and obstructive lung disease ([place]: Predominantly restriction due to chronic volume loss in the left lung.  Mild small airways disease. Pulmonary hypertension multifactorial in nature with groupings including secondary causes of chf and valve disease.  Plan:   Problem List Items Addressed This Visit       Genitourinary   Endometrial cancer ([place]   Advised her to try some lotrimin or other yeast cream for possible labial yeast infection.   We will see her back in 8 months and she will see Dr BLeafy Roin 4 months.   The patient's diagnosis, an outline of the further diagnostic and laboratory studies which will be required, the recommendation, and alternatives were discussed.  All questions were answered to the patient's satisfaction.  AMellody Drown MD   CC:  TEzequiel Kayser MD 1Wax[facility]achieKTerrebonne General Medical CenterMTancred  [place] [phone]

## 2021-06-29 ENCOUNTER — Ambulatory Visit: Payer: Medicare Other | Admitting: Pulmonary Disease

## 2021-07-27 ENCOUNTER — Ambulatory Visit: Payer: Medicare Other | Admitting: Sports Medicine

## 2021-08-15 NOTE — Progress Notes (Deleted)
? Benito Mccreedy D.Merril Abbe ?[facility] ?[place] ?Phone: [phone] ? ?Assessment and Plan:   ?  ?There are no diagnoses linked to this encounter.  ?  ?  ?Date of injury was ***. Original symptom severity scores were *** and ***. The patient was counseled on the nature of the injury, typical course and potential options for further evaluation and treatment. Discussed the importance of compliance with recommendations. Patient stated understanding of this plan and willingness to comply. ? ?Recommendations:  ?-  Complete mental and physical rest for 48 hours after concussive event ?- Recommend light aerobic activity while keeping symptoms less than 3/10 ?- Stop mental or physical activities that cause symptoms to worsen greater than 3/10, and wait 24 hours before attempting them again ?- Eliminate screen time as much as possible for first 48 hours after concussive event, then continue limited screen time (recommend less than 2 hours per day) ? ? ?- Encouraged to RTC in *** for reassessment or sooner for any concerns or acute changes  ? ?Pertinent previous records reviewed include *** ?  ?Time of visit *** minutes, which included chart review, physical exam, treatment plan, symptom severity score, VOMS, and tandem gait testing being performed, interpreted, and discussed with patient at today's visit. ?  ?Subjective:   ?I, Pincus Badder, am serving as a Education administrator for [employer] ? ?Chief Complaint: concussion symptoms  ? ?HPI:  ?08/16/2021 ?Patient is a 70 year old female complaining of concussion symptoms. Patient states ? ?  ?Concussion HPI:  ?- Injury date: ***   ?- Mechanism of injury: ***  ?- LOC: ***  ?- Initial evaluation: ***  ?- Previous head injuries/concussions: ***   ?- Previous imaging: ***    ?- Social history: Student at ***, activities include ***  ?  ?Hospitalization for head injury? No*** ?Diagnosed/treated for headache disorder or migraines?  No*** ?Diagnosed with learning disability Angie Fava? No*** ?Diagnosed with ADD/ADHD? No*** ?Diagnose with Depression, anxiety, or other Psychiatric Disorder? No*** ?  ?Current medications:  ?Current Outpatient Medications  ?Medication Sig Dispense Refill  ? acetaminophen (TYLENOL) 500 MG tablet Take 1,000 mg by mouth every 8 (eight) hours as needed for moderate pain.     ? albuterol (VENTOLIN HFA) 108 (90 Base) MCG/ACT inhaler Inhale 1-2 puffs into the lungs every 6 (six) hours as needed for wheezing or shortness of breath. 18 g 5  ? buPROPion (WELLBUTRIN SR) 200 MG 12 hr tablet Take 200 mg by mouth every morning.     ? clonazePAM (KLONOPIN) 0.5 MG tablet Take 0.5 mg by mouth 2 (two) times daily as needed for anxiety.    ? donepezil (ARICEPT) 10 MG tablet Take 10 mg by mouth at bedtime.    ? Fluticasone-Umeclidin-Vilant (TRELEGY ELLIPTA) 100-62.5-25 MCG/ACT AEPB Inhale 100 mcg into the lungs daily. 28 each 0  ? furosemide (LASIX) 20 MG tablet Take 20 mg by mouth daily as needed for fluid.     ? memantine (NAMENDA) 5 MG tablet Take 5 mg by mouth 2 (two) times daily.    ? methylPREDNISolone (MEDROL DOSEPAK) 4 MG TBPK tablet Take as directed in the package.  This is a taper pack. 21 tablet 0  ? Multiple Vitamins-Minerals (MULTIVITAMIN WITH MINERALS) tablet Take 1 tablet by mouth daily.    ? spironolactone (ALDACTONE) 25 MG tablet Take 25 mg by mouth daily.    ? Suvorexant (BELSOMRA PO) Take by mouth.    ? SYMBICORT 160-4.5 MCG/ACT  inhaler TAKE 2 PUFFS BY MOUTH TWICE A DAY 10.2 each 1  ? telmisartan (MICARDIS) 40 MG tablet Take 40 mg by mouth daily.    ? ?No current facility-administered medications for this visit.  ? ? ?  ?Objective:   ?  ?There were no vitals filed for this visit.  ?  ?There is no height or weight on file to calculate BMI.  ?  ?Physical Exam:   ?  ?General: Well-appearing, cooperative, sitting comfortably in no acute distress.  ?Psychiatric: Mood and affect are appropriate.   ?  ?Today's Symptom  Severity Score: ? ?Scores: 0-6 ? ?Headache:*** ?"Pressure in head":***  ?Neck Pain:***  ?Nausea or vomiting:***  ?Dizziness:***  ?Blurred vision:***  ?Balance problems:***  ?Sensitivity to light:***  ?Sensitivity to noise:***  ?Feeling slowed down:***  ?Feeling like ?in a fog?:***  ??Don?t feel right?:***  ?Difficulty concentrating:***  ?Difficulty remembering:***  ?Fatigue or low energy:***  ?Confusion:***  ?Drowsiness:***  ?More emotional:***  ?Irritability:***  ?Sadness:***  ?Nervous or Anxious:***  ?Trouble falling asleep:***  ? ?Total number of symptoms: ***/22  ?Symptom Severity index: ***/132  ?Worse with physical activity? No*** ?Worse with mental activity? No*** ?Percent improved since injury: ***%  ?  ?Full pain-free cervical PROM: yes***  ?  ?Tandem gait: ?- Forward, eyes open: *** errors ?- Backward, eyes open: *** errors ?- Forward, eyes closed: *** errors ?- Backward, eyes closed: *** errors ? ?VOMS:  ? - Baseline symptoms: *** ?- Horizontal Vestibular-Ocular Reflex: ***/10  ?- Vertical Vestibular-Ocular Reflex: ***/10  ?- Smooth pursuits: ***/10  ?- Horizontal Saccades:  ***/10  ?- Vertical Saccades: ***/10  ?- Visual Motion Sensitivity Test:  ***/10  ?- Convergence: ***cm (<5 cm normal)  ?  ? ?Electronically signed by:  ?Benito Mccreedy D.Merril Abbe ?[facility] ?1:12 PM 08/15/21 ?

## 2021-08-16 ENCOUNTER — Ambulatory Visit: Payer: Medicare Other | Admitting: Sports Medicine

## 2021-08-17 ENCOUNTER — Other Ambulatory Visit: Payer: Self-pay | Admitting: Physician Assistant

## 2021-08-17 ENCOUNTER — Other Ambulatory Visit: Payer: Self-pay

## 2021-08-17 ENCOUNTER — Ambulatory Visit
Admission: RE | Admit: 2021-08-17 | Discharge: 2021-08-17 | Disposition: A | Discharge status: Home / Self Care | Payer: Medicare Other | Source: Ambulatory Visit | Attending: Physician Assistant | Admitting: Physician Assistant

## 2021-08-17 DIAGNOSIS — S0990XA Unspecified injury of head, initial encounter: Secondary | ICD-10-CM

## 2021-11-01 ENCOUNTER — Encounter: Payer: Self-pay | Admitting: Family

## 2021-11-01 ENCOUNTER — Ambulatory Visit: Payer: Medicare Other | Attending: Family | Admitting: Family

## 2021-11-01 VITALS — BP 112/65 | HR 50 | Resp 14 | Ht 66.0 in | Wt 184.1 lb

## 2021-11-01 DIAGNOSIS — I10 Essential (primary) hypertension: Secondary | ICD-10-CM

## 2021-11-01 DIAGNOSIS — F32A Depression, unspecified: Secondary | ICD-10-CM | POA: Insufficient documentation

## 2021-11-01 DIAGNOSIS — F329 Major depressive disorder, single episode, unspecified: Secondary | ICD-10-CM | POA: Diagnosis not present

## 2021-11-01 DIAGNOSIS — I509 Heart failure, unspecified: Secondary | ICD-10-CM | POA: Diagnosis present

## 2021-11-01 DIAGNOSIS — E785 Hyperlipidemia, unspecified: Secondary | ICD-10-CM | POA: Insufficient documentation

## 2021-11-01 DIAGNOSIS — F419 Anxiety disorder, unspecified: Secondary | ICD-10-CM | POA: Insufficient documentation

## 2021-11-01 DIAGNOSIS — C541 Malignant neoplasm of endometrium: Secondary | ICD-10-CM | POA: Insufficient documentation

## 2021-11-01 DIAGNOSIS — I272 Pulmonary hypertension, unspecified: Secondary | ICD-10-CM | POA: Diagnosis not present

## 2021-11-01 DIAGNOSIS — K219 Gastro-esophageal reflux disease without esophagitis: Secondary | ICD-10-CM | POA: Diagnosis not present

## 2021-11-01 DIAGNOSIS — N189 Chronic kidney disease, unspecified: Secondary | ICD-10-CM | POA: Diagnosis not present

## 2021-11-01 DIAGNOSIS — I130 Hypertensive heart and chronic kidney disease with heart failure and stage 1 through stage 4 chronic kidney disease, or unspecified chronic kidney disease: Secondary | ICD-10-CM | POA: Insufficient documentation

## 2021-11-01 DIAGNOSIS — I5022 Chronic systolic (congestive) heart failure: Secondary | ICD-10-CM | POA: Diagnosis not present

## 2021-11-01 NOTE — Patient Instructions (Addendum)
Resume weighing daily and call for an overnight weight gain of 3 pounds or more or a weekly weight gain of more than 5 pounds.   If you have voicemail, please make sure your mailbox is cleaned out so that we may leave a message and please make sure to listen to any voicemails.    If you receive a satisfaction survey regarding the [facility], please take the time to fill it out. This way we can continue to provide excellent care and make any changes that need to be made.   

## 2021-11-01 NOTE — Progress Notes (Signed)
[facility] - PHARMACIST COUNSELING NOTE  Guideline-Directed Medical Therapy/Evidence Based Medicine  ACE/ARB/ARNI:  telmisartan 40 mg daily Beta Blocker: Carvedilol 12.5 mg twice daily Aldosterone Antagonist: Spironolactone 25 mg daily Diuretic: Furosemide 20 mg daily PRN SGLT2i: None  Adherence Assessment  Do you ever forget to take your medication? '[]'$ Yes '[]'$ No  Do you ever skip doses due to side effects? '[]'$ Yes '[]'$ No  Do you have trouble affording your medicines? '[]'$ Yes '[]'$ No  Are you ever unable to pick up your medication due to transportation difficulties? '[]'$ Yes '[]'$ No  Do you ever stop taking your medications because you don't believe they are helping? '[]'$ Yes '[]'$ No  Do you check your weight daily? '[]'$ Yes '[]'$ No   Adherence strategy: none (medications in bottles and contained together in bag)  Barriers to obtaining medications: Patient states that she would like assistance organizing medications  Vital signs: HR 50, BP 112/65, weight (pounds) 184 lb ECHO: Date 06/2019, EF 40%      Latest Ref Rng & Units 03/25/2021    2:40 PM 08/12/2020    5:31 PM 10/08/2019    1:41 PM  BMP  Glucose 70 - 99 mg/dL 93  124  100   BUN 8 - 23 mg/dL '24  28  23   '$ Creatinine 0.44 - 1.00 mg/dL 1.02  0.93  1.04   Sodium 135 - 145 mmol/L 139  139  139   Potassium 3.5 - 5.1 mmol/L 4.3  4.6  4.1   Chloride 98 - 111 mmol/L 106  106  104   CO2 22 - 32 mmol/L '28  26  25   '$ Calcium 8.9 - 10.3 mg/dL 9.5  9.8  9.4     Past Medical History:  Diagnosis Date   Anxiety    Asthma    CHF (congestive heart failure) (HCC)    Chronic kidney disease    Renal Insufficiency Stage 3   Degenerative disc disease, cervical    Depression    GERD (gastroesophageal reflux disease)    Hyperlipidemia    Hypertension    Mixed restrictive and obstructive lung disease (HCC)    Predominantly restriction due to chronic volume loss in the left lung.  Mild small airways disease.    Moderate mitral insufficiency    Monoclonal gammopathy    Polymyositis (HCC)    Pulmonary hypertension [facility])     ASSESSMENT 71 year old female who presents to the HF clinic for management of HFrEF. PMH relevant to HTN, CKD, HLD, pulmonary HTN, depression, GERD, anxiety. Last ECHO 2019 EF 40%. On spironolactone 25, telmisartan 40, and furosemide as needed.   PLAN Reconciled medications. Added carvedilol 12.5 mg BID to patient list. Added frequency of Belsomra based on pharmacy fill history. Provided contact information for local independent pharmacies that provide adherence/pill packaging programs. Also advised to call [facility] to inquire about services. Repeat ECHO   Time spent: 15 minutes  Wynelle Cleveland, PharmD Pharmacy Resident  11/01/2021 1:38 PM  Current Outpatient Medications:    acetaminophen (TYLENOL) 500 MG tablet, Take 1,000 mg by mouth every 8 (eight) hours as needed for moderate pain. Needs daily, Disp: , Rfl:    albuterol (VENTOLIN HFA) 108 (90 Base) MCG/ACT inhaler, Inhale 1-2 puffs into the lungs every 6 (six) hours as needed for wheezing or shortness of breath., Disp: 18 g, Rfl: 5   buPROPion (WELLBUTRIN SR) 200 MG 12 hr tablet, Take 200 mg by mouth every morning. , Disp: , Rfl:  carvedilol (COREG) 12.5 MG tablet, Take 12.5 mg by mouth 2 (two) times daily., Disp: , Rfl:    clonazePAM (KLONOPIN) 0.5 MG tablet, Take 0.5 mg by mouth 2 (two) times daily as needed for anxiety., Disp: , Rfl:    donepezil (ARICEPT) 10 MG tablet, Take 10 mg by mouth at bedtime., Disp: , Rfl:    Fluticasone-Umeclidin-Vilant (TRELEGY ELLIPTA) 100-62.5-25 MCG/ACT AEPB, Inhale 100 mcg into the lungs daily., Disp: 28 each, Rfl: 0   furosemide (LASIX) 20 MG tablet, Take 20 mg by mouth daily as needed for fluid. Takes every few days, Disp: , Rfl:    memantine (NAMENDA) 5 MG tablet, Take 5 mg by mouth 2 (two) times daily., Disp: , Rfl:    Multiple Vitamins-Minerals (MULTIVITAMIN WITH  MINERALS) tablet, Take 1 tablet by mouth daily., Disp: , Rfl:    spironolactone (ALDACTONE) 25 MG tablet, Take 25 mg by mouth daily., Disp: , Rfl:    Suvorexant (BELSOMRA PO), Take 10 mg by mouth every evening. Takes a few nights per week, Disp: , Rfl:    telmisartan (MICARDIS) 40 MG tablet, Take 40 mg by mouth daily., Disp: , Rfl:    COUNSELING POINTS/CLINICAL PEARLS  DRUGS TO CAUTION IN HEART FAILURE  Drug or Class Mechanism  Analgesics NSAIDs COX-2 inhibitors Glucocorticoids  Sodium and water retention, increased systemic vascular resistance, decreased response to diuretics   Diabetes Medications Metformin Thiazolidinediones Rosiglitazone (Avandia) Pioglitazone (Actos) DPP4 Inhibitors Saxagliptin (Onglyza) Sitagliptin (Januvia)   Lactic acidosis Possible calcium channel blockade   Unknown  Antiarrhythmics Class I  Flecainide Disopyramide Class III Sotalol Other Dronedarone  Negative inotrope, proarrhythmic   Proarrhythmic, beta blockade  Negative inotrope  Antihypertensives Alpha Blockers Doxazosin Calcium Channel Blockers Diltiazem Verapamil Nifedipine Central Alpha Adrenergics Moxonidine Peripheral Vasodilators Minoxidil  Increases renin and aldosterone  Negative inotrope    Possible sympathetic withdrawal  Unknown  Anti-infective Itraconazole Amphotericin B  Negative inotrope Unknown  Hematologic Anagrelide Cilostazol   Possible inhibition of PD IV Inhibition of PD III causing arrhythmias  Neurologic/Psychiatric Stimulants Anti-Seizure Drugs Carbamazepine Pregabalin Antidepressants Tricyclics Citalopram Parkinsons Bromocriptine Pergolide Pramipexole Antipsychotics Clozapine Antimigraine Ergotamine Methysergide Appetite suppressants Bipolar Lithium  Peripheral alpha and beta agonist activity  Negative inotrope and chronotrope Calcium channel blockade  Negative inotrope, proarrhythmic Dose-dependent QT  prolongation  Excessive serotonin activity/valvular damage Excessive serotonin activity/valvular damage Unknown  IgE mediated hypersensitivy, calcium channel blockade  Excessive serotonin activity/valvular damage Excessive serotonin activity/valvular damage Valvular damage  Direct myofibrillar degeneration, adrenergic stimulation  Antimalarials Chloroquine Hydroxychloroquine Intracellular inhibition of lysosomal enzymes  Urologic Agents Alpha Blockers Doxazosin Prazosin Tamsulosin Terazosin  Increased renin and aldosterone  Adapted from Page Carleene Overlie, et al. "Drugs That May Cause or Exacerbate Heart Failure: A Scientific Statement from the American Heart  Association." Circulation 2016; 134:e32-e69. DOI: 10.1161/CIR.0000000000000426   MEDICATION ADHERENCES TIPS AND STRATEGIES Taking medication as prescribed improves patient outcomes in heart failure (reduces hospitalizations, improves symptoms, increases survival) Side effects of medications can be managed by decreasing doses, switching agents, stopping drugs, or adding additional therapy. Please let someone in the [facility] know if you have having bothersome side effects so we can modify your regimen. Do not alter your medication regimen without talking to [place].  Medication reminders can help patients remember to take drugs on time. If you are missing or forgetting doses you can try linking behaviors, using pill boxes, or an electronic reminder like an alarm on your phone or an app. Some people can also get automated phone calls  as medication reminders.

## 2021-11-01 NOTE — Progress Notes (Signed)
Patient ID: Anna Romero, female    DOB: November 27, 1950, 71 y.o.   MRN: [id number]  HPI  Ms Gosse is a 71 y/o female with a history of hyperlipidemia, HTN, CKD, pulmonary HTN, depression, GERD, anxiety and chronic heart failure.   Echo report from 07/09/17 reviewed and showed an EF of 40% along with moderate MR/TR.  She has not been admitted or been in the ED in the last 6 months.    She presents today for a follow-up visit although hasn't been seen since July 2020. She presents with a chief complaint of minimal shortness of breath upon moderate exertion. Describes this as chronic in nature having been present for several years. She has associated fatigue, depression, chronic pain and difficulty sleeping along with this. She denies any abdominal distention, palpitations, pedal edema, chest pain, cough or dizziness.   Has scales at home but hasn't been weighing daily. Has been walking daily for 1 mile each day. Admits that her husband is still difficult as he won't consistently take his mental health medications.   Past Medical History:  Diagnosis Date   Anxiety    Asthma    CHF (congestive heart failure) (HCC)    Chronic kidney disease    Renal Insufficiency Stage 3   Degenerative disc disease, cervical    Depression    GERD (gastroesophageal reflux disease)    Hyperlipidemia    Hypertension    Mixed restrictive and obstructive lung disease (HCC)    Predominantly restriction due to chronic volume loss in the left lung.  Mild small airways disease.   Moderate mitral insufficiency    Monoclonal gammopathy    Polymyositis (HCC)    Pulmonary hypertension ([place])    Past Surgical History:  Procedure Laterality Date   APPENDECTOMY     BREAST EXCISIONAL BIOPSY Left 1990   negative   CATARACT EXTRACTION     COLONOSCOPY N/A 08/27/2016   Procedure: COLONOSCOPY;  Surgeon: Lollie Sails, MD;  Location: [facility] ENDOSCOPY;  Service: Endoscopy;  Laterality: N/A;   COLONOSCOPY WITH PROPOFOL  N/A 07/22/2018   Procedure: COLONOSCOPY WITH PROPOFOL;  Surgeon: Lollie Sails, MD;  Location: [facility] ENDOSCOPY;  Service: Endoscopy;  Laterality: N/A;   HYSTEROSCOPY WITH D & C N/A 10/12/2019   Procedure: DILATATION AND CURETTAGE /HYSTEROSCOPY, POLYPECTOMY OR MYOMECTOMY;  Surgeon: Benjaman Kindler, MD;  Location: [facility] ORS;  Service: Gynecology;  Laterality: N/A;   JOINT REPLACEMENT     REPLACEMENT TOTAL HIP W/  RESURFACING IMPLANTS Right    THYMECTOMY     Family History  Adopted: Yes  Problem Relation Age of Onset   Breast cancer Neg Hx    Social History   Tobacco Use   Smoking status: Never   Smokeless tobacco: Never  Substance Use Topics   Alcohol use: No   Allergies  Allergen Reactions   Hctz [Hydrochlorothiazide] Other (See Comments)    Kidney Disorder   Prior to Admission medications   Medication Sig Start Date End Date Taking? Authorizing Provider  acetaminophen (TYLENOL) 500 MG tablet Take 1,000 mg by mouth every 8 (eight) hours as needed for moderate pain. Needs daily   Yes [provider]  albuterol (VENTOLIN HFA) 108 (90 Base) MCG/ACT inhaler Inhale 1-2 puffs into the lungs every 6 (six) hours as needed for wheezing or shortness of breath. 06/14/20  Yes Tyler Pita, MD  buPROPion [facility] SR) 200 MG 12 hr tablet Take 200 mg by mouth every morning.  09/15/19  Yes [provider]  carvedilol (COREG) 12.5 MG tablet Take 12.5 mg by mouth 2 (two) times daily. 10/05/21  Yes [provider]  clonazePAM (KLONOPIN) 0.5 MG tablet Take 0.5 mg by mouth 2 (two) times daily as needed for anxiety.   Yes [provider]  donepezil (ARICEPT) 10 MG tablet Take 10 mg by mouth at bedtime.   Yes [provider]  Fluticasone-Umeclidin-Vilant (TRELEGY ELLIPTA) 100-62.5-25 MCG/ACT AEPB Inhale 100 mcg into the lungs daily. 05/23/21  Yes Tyler Pita, MD  furosemide (LASIX) 20 MG tablet Take 20 mg by mouth daily as needed for fluid. Takes  every few days   Yes [provider]  memantine (NAMENDA) 5 MG tablet Take 5 mg by mouth 2 (two) times daily. 09/20/19  Yes [provider]  Multiple Vitamins-Minerals (MULTIVITAMIN WITH MINERALS) tablet Take 1 tablet by mouth daily.   Yes [provider]  spironolactone (ALDACTONE) 25 MG tablet Take 25 mg by mouth daily.   Yes [provider]  Suvorexant (BELSOMRA PO) Take 10 mg by mouth every evening. Takes a few nights per week   Yes [provider]  telmisartan (MICARDIS) 40 MG tablet Take 40 mg by mouth daily.   Yes [provider]   Review of Systems  Constitutional:  Positive for fatigue. Negative for appetite change.  HENT:  Positive for congestion and rhinorrhea. Negative for sore throat.   Eyes: Negative.   Respiratory:  Positive for shortness of breath (minimal). Negative for chest tightness.   Cardiovascular:  Negative for chest pain, palpitations and leg swelling.  Gastrointestinal:  Negative for abdominal distention and abdominal pain.  Endocrine: Negative.   Genitourinary: Negative.   Musculoskeletal:  Positive for back pain (chronic pain) and neck pain (at times).  Skin: Negative.   Allergic/Immunologic: Negative.   Neurological:  Negative for dizziness and light-headedness.       Memory loss  Hematological:  Negative for adenopathy. Does not bruise/bleed easily.  Psychiatric/Behavioral:  Positive for dysphoric mood and sleep disturbance (not sleeping well; sleeping on 1 pillow). The patient is not nervous/anxious.    Vitals:   11/01/21 1251  BP: 112/65  Pulse: (!) 50  Resp: 14  SpO2: 98%  Weight: 184 lb 2 oz (83.5 kg)  Height: '5\' 6"'$  (1.676 m)   Wt Readings from Last 3 Encounters:  11/01/21 184 lb 2 oz (83.5 kg)  06/14/21 183 lb 14.4 oz (83.4 kg)  05/23/21 184 lb (83.5 kg)   Lab Results  Component Value Date   CREATININE 1.02 (H) 03/25/2021   CREATININE 0.93 08/12/2020   CREATININE 1.04 (H) 10/08/2019    Physical Exam Vitals and nursing note reviewed.  Constitutional:      Appearance: She is well-developed.  HENT:     Head: Normocephalic and atraumatic.  Neck:     Vascular: No JVD.  Cardiovascular:     Rate and Rhythm: Regular rhythm. Bradycardia present.  Pulmonary:     Effort: Pulmonary effort is normal. No respiratory distress.     Breath sounds: No wheezing or rales.  Abdominal:     Tenderness: There is no abdominal tenderness.  Musculoskeletal:     Cervical back: Normal range of motion and neck supple.     Right lower leg: No tenderness. No edema.     Left lower leg: No tenderness. No edema.  Skin:    General: Skin is warm and dry.  Neurological:     Mental Status: She is alert and oriented to  person, place, and time.  Psychiatric:        Mood and Affect: Mood normal.        Behavior: Behavior normal.    Assessment & Plan:  1: Chronic heart failure with mildly reduced ejection fraction- - NYHA class II - euvolemic today - not weighing daily but does have working scales; reviewed the importance of weighing daily so that she can call for an overnight weight gain of >2 pounds or a weekly weight gain of >5 pounds - not adding salt to her food and has been reading food labels - saw cardiology Petra Kuba) 06/06/21 - saw pulmonology Alva Garnet) 12/16/2018 - echo has been scheduled for 11/30/21; any med changes will be depending on those results - BNP 08/12/20 was 87.9 - PharmD reconciled medications with the patient  2: HTN- - BP looks good (112/65) - saw PCP (Tumey) 08/17/21 - BMP from 08/17/21 reviewed and showed sodium 140, potassium 4.8, creatinine 1.1 and GFR 59  3: Depression- - patient says that she's still seeing a counselor along with her psychiatrist - says that she's been walking for a mile every day which helps with her mental status  4: Endometrial cancer- - saw GYN Leafy Ro) 10/24/21   Medication bottles reviewed.   Return in 1 month after her echo, sooner if  needed.

## 2021-11-22 ENCOUNTER — Other Ambulatory Visit: Payer: Self-pay | Admitting: Pulmonary Disease

## 2021-11-30 ENCOUNTER — Ambulatory Visit (HOSPITAL_BASED_OUTPATIENT_CLINIC_OR_DEPARTMENT_OTHER): Payer: Medicare Other | Admitting: Family

## 2021-11-30 ENCOUNTER — Encounter: Payer: Self-pay | Admitting: Family

## 2021-11-30 ENCOUNTER — Ambulatory Visit
Admission: RE | Admit: 2021-11-30 | Discharge: 2021-11-30 | Disposition: A | Discharge status: Home / Self Care | Payer: Medicare Other | Source: Ambulatory Visit | Attending: Family | Admitting: Family

## 2021-11-30 VITALS — BP 114/56 | HR 48 | Resp 14 | Ht 66.0 in | Wt 177.4 lb

## 2021-11-30 DIAGNOSIS — I10 Essential (primary) hypertension: Secondary | ICD-10-CM

## 2021-11-30 DIAGNOSIS — G479 Sleep disorder, unspecified: Secondary | ICD-10-CM | POA: Insufficient documentation

## 2021-11-30 DIAGNOSIS — F329 Major depressive disorder, single episode, unspecified: Secondary | ICD-10-CM

## 2021-11-30 DIAGNOSIS — G8929 Other chronic pain: Secondary | ICD-10-CM | POA: Diagnosis not present

## 2021-11-30 DIAGNOSIS — I130 Hypertensive heart and chronic kidney disease with heart failure and stage 1 through stage 4 chronic kidney disease, or unspecified chronic kidney disease: Secondary | ICD-10-CM | POA: Diagnosis not present

## 2021-11-30 DIAGNOSIS — I272 Pulmonary hypertension, unspecified: Secondary | ICD-10-CM | POA: Diagnosis not present

## 2021-11-30 DIAGNOSIS — E785 Hyperlipidemia, unspecified: Secondary | ICD-10-CM | POA: Insufficient documentation

## 2021-11-30 DIAGNOSIS — N189 Chronic kidney disease, unspecified: Secondary | ICD-10-CM | POA: Insufficient documentation

## 2021-11-30 DIAGNOSIS — I5022 Chronic systolic (congestive) heart failure: Secondary | ICD-10-CM

## 2021-11-30 DIAGNOSIS — K219 Gastro-esophageal reflux disease without esophagitis: Secondary | ICD-10-CM | POA: Insufficient documentation

## 2021-11-30 DIAGNOSIS — F32A Depression, unspecified: Secondary | ICD-10-CM | POA: Insufficient documentation

## 2021-11-30 DIAGNOSIS — C541 Malignant neoplasm of endometrium: Secondary | ICD-10-CM

## 2021-11-30 DIAGNOSIS — F419 Anxiety disorder, unspecified: Secondary | ICD-10-CM | POA: Diagnosis not present

## 2021-11-30 LAB — ECHOCARDIOGRAM COMPLETE
AR max vel: 2.27 cm2
AV Area VTI: 2.28 cm2
AV Area mean vel: 2.23 cm2
AV Mean grad: 3.5 mmHg
AV Peak grad: 6 mmHg
Ao pk vel: 1.22 m/s
Area-P 1/2: 1.79 cm2
S' Lateral: 3.4 cm

## 2021-11-30 NOTE — Progress Notes (Signed)
*  PRELIMINARY RESULTS* Echocardiogram 2D Echocardiogram has been performed.  Anna Romero 11/30/2021, 12:06 PM

## 2021-11-30 NOTE — Patient Instructions (Addendum)
Resume weighing daily and call for an overnight weight gain of 3 pounds or more or a weekly weight gain of more than 5 pounds.   If you have voicemail, please make sure your mailbox is cleaned out so that we may leave a message and please make sure to listen to any voicemails.    Consider using [facility] for your heart

## 2021-11-30 NOTE — Progress Notes (Signed)
Patient ID: Anna Romero, female    DOB: [birth year], 71 y.o.   MRN: [id number]  HPI  Ms Siple is a 71 y/o female with a history of hyperlipidemia, HTN, CKD, pulmonary HTN, depression, GERD, anxiety and chronic heart failure.   Echo report from 11/30/21 reviewed and showed an EF of 45-50%. Echo report from 07/09/17 reviewed and showed an EF of 40% along with moderate MR/TR.  She has not been admitted or been in the ED in the last 6 months.    She presents today for a follow-up visit with a chief complaint of minimal fatigue upon moderate exertion. Describes this as chronic in nature. She has associated shortness of breath, chronic difficulty sleeping, depression and chronic pain along with this. She denies any dizziness, abdominal distention, palpitations, pedal edema, chest pain or weight gain (although not weighing daily).   Past Medical History:  Diagnosis Date   Anxiety    Asthma    CHF (congestive heart failure) (HCC)    Chronic kidney disease    Renal Insufficiency Stage 3   Degenerative disc disease, cervical    Depression    GERD (gastroesophageal reflux disease)    Hyperlipidemia    Hypertension    Mixed restrictive and obstructive lung disease (HCC)    Predominantly restriction due to chronic volume loss in the left lung.  Mild small airways disease.   Moderate mitral insufficiency    Monoclonal gammopathy    Polymyositis (HCC)    Pulmonary hypertension ([place])    Past Surgical History:  Procedure Laterality Date   APPENDECTOMY     BREAST EXCISIONAL BIOPSY Left 1990   negative   CATARACT EXTRACTION     COLONOSCOPY N/A 08/27/2016   Procedure: COLONOSCOPY;  Surgeon: Lollie Sails, MD;  Location: [facility] ENDOSCOPY;  Service: Endoscopy;  Laterality: N/A;   COLONOSCOPY WITH PROPOFOL N/A 07/22/2018   Procedure: COLONOSCOPY WITH PROPOFOL;  Surgeon: Lollie Sails, MD;  Location: [facility] ENDOSCOPY;  Service: Endoscopy;  Laterality: N/A;   HYSTEROSCOPY WITH D & C N/A  10/12/2019   Procedure: DILATATION AND CURETTAGE /HYSTEROSCOPY, POLYPECTOMY OR MYOMECTOMY;  Surgeon: Benjaman Kindler, MD;  Location: [facility] ORS;  Service: Gynecology;  Laterality: N/A;   JOINT REPLACEMENT     REPLACEMENT TOTAL HIP W/  RESURFACING IMPLANTS Right    THYMECTOMY     Family History  Adopted: Yes  Problem Relation Age of Onset   Breast cancer Neg Hx    Social History   Tobacco Use   Smoking status: Never   Smokeless tobacco: Never  Substance Use Topics   Alcohol use: No   Allergies  Allergen Reactions   Hctz [Hydrochlorothiazide] Other (See Comments)    Kidney Disorder   Prior to Admission medications   Medication Sig Start Date End Date Taking? Authorizing Provider  acetaminophen (TYLENOL) 500 MG tablet Take 1,000 mg by mouth every 8 (eight) hours as needed for moderate pain. Needs daily   Yes [provider]  buPROPion (WELLBUTRIN SR) 200 MG 12 hr tablet Take 200 mg by mouth every morning.  09/15/19  Yes [provider]  carvedilol (COREG) 12.5 MG tablet Take 12.5 mg by mouth 2 (two) times daily. 10/05/21  Yes [provider]  clonazePAM (KLONOPIN) 0.5 MG tablet Take 0.5 mg by mouth 2 (two) times daily as needed for anxiety.   Yes [provider]  donepezil (ARICEPT) 10 MG tablet Take 10 mg by mouth at bedtime.   Yes [provider]  Fluticasone-Umeclidin-Vilant (TRELEGY ELLIPTA) 100-62.5-25 MCG/ACT AEPB Inhale 100 mcg into the lungs daily. 05/23/21  Yes Tyler Pita, MD  furosemide (LASIX) 20 MG tablet Take 20 mg by mouth daily as needed for fluid. Takes every few days   Yes [provider]  memantine (NAMENDA) 5 MG tablet Take 5 mg by mouth 2 (two) times daily. 09/20/19  Yes [provider]  Multiple Vitamins-Minerals (MULTIVITAMIN WITH MINERALS) tablet Take 1 tablet by mouth daily.   Yes [provider]  spironolactone (ALDACTONE) 25 MG tablet Take 25 mg by mouth daily.   Yes [provider]  Suvorexant (BELSOMRA PO) Take 10 mg by mouth every evening. Takes a few nights per week   Yes [provider]  telmisartan (MICARDIS) 40 MG tablet Take 40 mg by mouth daily.   Yes [provider]  albuterol (VENTOLIN HFA) 108 (90 Base) MCG/ACT inhaler INHALE 1-2 PUFFS BY MOUTH EVERY 6 HOURS AS NEEDED FOR WHEEZE OR SHORTNESS OF BREATH Patient not taking: Reported on 11/30/2021 11/22/21   Tyler Pita, MD    Review of Systems  Constitutional:  Positive for fatigue. Negative for appetite change.  HENT:  Positive for congestion. Negative for rhinorrhea and sore throat.   Eyes: Negative.   Respiratory:  Positive for shortness of breath (minimal). Negative for chest tightness.   Cardiovascular:  Negative for chest pain, palpitations and leg swelling.  Gastrointestinal:  Negative for abdominal distention and abdominal pain.  Endocrine: Negative.   Genitourinary: Negative.   Musculoskeletal:  Positive for back pain (chronic pain) and neck pain (at times).  Skin: Negative.   Allergic/Immunologic: Negative.   Neurological:  Negative for dizziness and light-headedness.       Memory loss  Hematological:  Negative for adenopathy. Does not bruise/bleed easily.  Psychiatric/Behavioral:  Positive for dysphoric mood and sleep disturbance (not sleeping well; sleeping on 1 pillow). The patient is not nervous/anxious.    Vitals:   11/30/21 1220  BP: (!) 114/56  Pulse: (!) 48  Resp: 14  SpO2: 98%  Weight: 177 lb 6 oz (80.5 kg)  Height: '5\' 6"'$  (1.676 m)   Wt Readings from Last 3 Encounters:  11/30/21 177 lb 6 oz (80.5 kg)  11/01/21 184 lb 2 oz (83.5 kg)  06/14/21 183 lb 14.4 oz (83.4 kg)   Lab Results  Component Value Date   CREATININE 1.02 (H) 03/25/2021   CREATININE 0.93 08/12/2020   CREATININE 1.04 (H) 10/08/2019   Physical Exam Vitals and nursing note reviewed.  Constitutional:      Appearance: She is well-developed.  HENT:     Head: Normocephalic and  atraumatic.  Neck:     Vascular: No JVD.  Cardiovascular:     Rate and Rhythm: Regular rhythm. Bradycardia present.  Pulmonary:     Effort: Pulmonary effort is normal. No respiratory distress.     Breath sounds: No wheezing or rales.  Abdominal:     Tenderness: There is no abdominal tenderness.  Musculoskeletal:     Cervical back: Normal range of motion and neck supple.     Right lower leg: No tenderness. No edema.     Left lower leg: No tenderness. No edema.  Skin:    General: Skin is warm and dry.  Neurological:     Mental Status: She is alert and oriented to person, place, and time.  Psychiatric:        Mood and Affect: Mood normal.        Behavior:  Behavior normal.    Assessment & Plan:  1: Chronic heart failure with mildly reduced ejection fraction- - NYHA class II - euvolemic today - not weighing daily but does have working scales; emphasized the importance of weighing daily so that she can call for an overnight weight gain of >2 pounds or a weekly weight gain of >5 pounds - weight down 7 pounds from last visit here 1 month ago - not adding salt to her food and has been reading food labels - saw cardiology Petra Kuba) 06/06/21 - saw pulmonology Alva Garnet) 12/16/2018 - had echo done earlier today and results were reviewed with her - discussed GDMT of SGLT2 as well as changing telmisartan to entresto - she asks to think about this and requests information on SGLT2 which was provided today - advised her that should she decide to proceed, she can just call me back and I'll send the RX in with f/u ~ 1 month after - otherwise, will re-address this at her next visit - already taking furosemide PRN and last took it a couple of days ago - BNP 08/12/20 was 87.9  2: HTN- - BP looks good (114/56) - saw PCP (Tumey) 08/17/21 - BMP from 08/17/21 reviewed and showed sodium 140, potassium 4.8, creatinine 1.1 and GFR 59  3: Depression- - patient says that she's still seeing a counselor along  with her psychiatrist - says that she's been walking for a mile every day which helps with her mental status  4: Endometrial cancer- - saw GYN Leafy Ro) 10/24/21   Medication bottles reviewed.   Return in 3 months, sooner if needed or if she decides to begin SGLT2

## 2021-12-03 ENCOUNTER — Other Ambulatory Visit: Payer: Self-pay

## 2021-12-03 DIAGNOSIS — N189 Chronic kidney disease, unspecified: Secondary | ICD-10-CM | POA: Diagnosis not present

## 2021-12-03 DIAGNOSIS — I5022 Chronic systolic (congestive) heart failure: Secondary | ICD-10-CM | POA: Insufficient documentation

## 2021-12-03 DIAGNOSIS — L299 Pruritus, unspecified: Secondary | ICD-10-CM | POA: Insufficient documentation

## 2021-12-03 NOTE — ED Triage Notes (Addendum)
Patient ambulatory to triage, states she is concerned she was bitten by snake while sleeping in her home. Denies seeing any snakes in her house. Woke this morning with 4 bite marks to right wrist with very mild redness and no swelling noted. Denies shortness of breath or chest pain. Reports mild headache and mild numbness to lips.

## 2021-12-04 ENCOUNTER — Emergency Department
Admission: EM | Admit: 2021-12-04 | Discharge: 2021-12-04 | Disposition: A | Discharge status: Home / Self Care | Payer: Medicare Other | Attending: Emergency Medicine | Admitting: Emergency Medicine

## 2021-12-04 DIAGNOSIS — W57XXXA Bitten or stung by nonvenomous insect and other nonvenomous arthropods, initial encounter: Secondary | ICD-10-CM

## 2021-12-04 NOTE — Discharge Instructions (Addendum)
Use Tylenol for pain and fevers.  Up to 1000 mg per dose, up to 4 times per day.  Do not take more than 4000 mg of Tylenol/acetaminophen within 24 hours..  Use Benadryl tablets or steroid cream.   Clean the area with warm soap and water and apply bacitracin or Neosporin to help prevent infection.

## 2021-12-04 NOTE — ED Provider Notes (Signed)
[facility] Provider Note    Event Date/Time   First MD Initiated Contact with Patient 12/04/21 0022     (approximate)   History   No chief complaint on file.   HPI  Anna Romero is a 71 y.o. female who presents to the ED for evaluation of No chief complaint on file.   I review CHF clinic visit from 7/13.History of mildly reduced EF, CKD, pulm hypertension, anxiety.  She presents to the ED today for evaluation of possible bug bites or snake bite on her wrist.  She reports awakening with the symptoms yesterday morning.  Reports that they itch.  Reports that she does not know what caused them, no ticks were removed.  No systemic symptoms, no weakness or tingling or numbness to the affected hand distal to this.  No shortness of breath, throat closure sensation.  Denies other rash.   Physical Exam   Triage Vital Signs: ED Triage Vitals  Enc Vitals Group     BP 12/03/21 2025 (!) 183/71     Pulse Rate 12/03/21 2025 (!) 56     Resp 12/03/21 2025 20     Temp 12/03/21 2025 98.5 F (36.9 C)     Temp Source 12/03/21 2025 Oral     SpO2 12/03/21 2025 98 %     Weight 12/03/21 2026 180 lb (81.6 kg)     Height 12/03/21 2026 '5\' 6"'$  (1.676 m)     Head Circumference --      Peak Flow --      Pain Score 12/03/21 2026 2     Pain Loc --      Pain Edu? --      Excl. in [place]? --     Most recent vital signs: Vitals:   12/03/21 2025  BP: (!) 183/71  Pulse: (!) 56  Resp: 20  Temp: 98.5 F (36.9 C)  SpO2: 98%    General: Awake, no distress.  Ambulatory independently with normal gait.  Well-appearing. CV:  Good peripheral perfusion.  Resp:  Normal effort.  Abd:  No distention.  MSK:  No deformity noted.  Neuro:  No focal deficits appreciated. Other:  Right wrist has 4 small erythematous lesions as pictured below.  No purulence, surrounding induration or fluctuance.  Hand is distally neurovascularly intact.      ED Results / Procedures / Treatments    Labs (all labs ordered are listed, but only abnormal results are displayed) Labs Reviewed - No data to display  EKG   RADIOLOGY   Official radiology report(s): No results found.  PROCEDURES and INTERVENTIONS:  Procedures  Medications - No data to display   IMPRESSION / MDM / [place] / ED COURSE  I reviewed the triage vital signs and the nursing notes.  Differential diagnosis includes, but is not limited to, hives, bug bite, snake bite, cellulitis, abscess  71 year old female presents to the ED with what looks like a couple bug bites on her wrist without evidence of superimposed infection and suitable for outpatient management.  We discussed conservative measures at home and return precautions.  She is suitable for outpatient management.      FINAL CLINICAL IMPRESSION(S) / ED DIAGNOSES   Final diagnoses:  None     Rx / DC Orders   ED Discharge Orders     None        Note:  This document was prepared using Dragon voice recognition software and may include unintentional dictation  errors.   Vladimir Crofts, MD 12/04/21 [phone]

## 2022-01-04 ENCOUNTER — Ambulatory Visit: Payer: Medicare Other | Attending: Neurology | Admitting: Speech Pathology

## 2022-01-04 ENCOUNTER — Telehealth: Payer: Self-pay | Admitting: Speech Pathology

## 2022-01-04 NOTE — Telephone Encounter (Signed)
Ms. Anna Romero was scheduled for a cognition assessment with me this afternoon at 3pm. We didn't hear from her, so I called and spoke with her. She sounded emotionally upset, was crying and stated, "I am not doing good at all." Out of concern for pt, I asked the following questions with pt responses noted by each question.  Is anyone present right now that is hurting you? No Are you in immediate harm? No Do you feel unsafe in your current relationship? - Not physically unsafe, but emotionally unsafe Do you feel threatened by others? Not physically, but emotionally Is anyone at home hurting you right now? Not physically but emotionally  I also asked if I could contact the sheriff's dept on her behalf and request a welfare check. She stated "no my daughter will be off work shortly and will check on me."  I encouraged her to call 911 at any time, reach out to Dr Tressia Miners or call the number that I was calling from [phone]) should she need any assistance.  Approximately 1 minute after hanging up, pt called me back and sounded very composed. She apologized for missing the appt. She further ensured me that she was safe, and her daughter would be coming by.  During both conversations, I reiterated the continued need to follow up with psychiatry, mental health counseling and neuropsych. At this time, she will call to reschedule ST services as needed.  PCP and Neurologist made aware of my conversations with pt.   Anna Romero B. Rutherford Nail, M.S., CCC-SLP, Mining engineer Certified Brain Injury [place]  [place] Office [phone] Ascom [phone] Fax [phone]

## 2022-01-09 ENCOUNTER — Ambulatory Visit: Payer: Medicare Other | Admitting: Speech Pathology

## 2022-01-10 ENCOUNTER — Encounter: Payer: Medicare Other | Admitting: Speech Pathology

## 2022-01-17 ENCOUNTER — Encounter: Payer: Medicare Other | Admitting: Speech Pathology

## 2022-01-19 ENCOUNTER — Encounter: Payer: Medicare Other | Admitting: Speech Pathology

## 2022-01-24 ENCOUNTER — Encounter: Payer: Medicare Other | Admitting: Speech Pathology

## 2022-01-26 ENCOUNTER — Encounter: Payer: Medicare Other | Admitting: Speech Pathology

## 2022-01-29 ENCOUNTER — Encounter: Payer: Medicare Other | Admitting: Speech Pathology

## 2022-01-31 ENCOUNTER — Encounter: Payer: Medicare Other | Admitting: Speech Pathology

## 2022-02-05 ENCOUNTER — Encounter: Payer: Medicare Other | Admitting: Speech Pathology

## 2022-02-07 ENCOUNTER — Encounter: Payer: Medicare Other | Admitting: Speech Pathology

## 2022-02-13 ENCOUNTER — Encounter: Payer: Medicare Other | Admitting: Speech Pathology

## 2022-02-14 ENCOUNTER — Inpatient Hospital Stay: Payer: Medicare Other | Attending: Obstetrics and Gynecology

## 2022-02-15 ENCOUNTER — Encounter: Payer: Medicare Other | Admitting: Speech Pathology

## 2022-02-20 ENCOUNTER — Encounter: Payer: Medicare Other | Admitting: Speech Pathology

## 2022-02-23 ENCOUNTER — Encounter: Payer: Medicare Other | Admitting: Speech Pathology

## 2022-02-27 ENCOUNTER — Ambulatory Visit: Payer: Medicare Other | Admitting: Family

## 2022-02-27 ENCOUNTER — Encounter: Payer: Medicare Other | Admitting: Speech Pathology

## 2022-03-01 ENCOUNTER — Encounter: Payer: Medicare Other | Admitting: Speech Pathology

## 2022-03-04 NOTE — Progress Notes (Deleted)
Patient ID: Anna Romero, female    DOB: 09/22/1950, 71 y.o.   MRN: [id number]  HPI  Anna Romero is a 71 y/o female with a history of hyperlipidemia, HTN, CKD, pulmonary HTN, depression, GERD, anxiety and chronic heart failure.   Echo report from 11/30/21 reviewed and showed an EF of 45-50%. Echo report from 07/09/17 reviewed and showed an EF of 40% along with moderate MR/TR.  Was in the ED 12/04/21 due to bug bites where she was evaluated and released.     She presents today for a follow-up visit with a chief complaint of   Past Medical History:  Diagnosis Date   Anxiety    Asthma    CHF (congestive heart failure) ([place])    Chronic kidney disease    Renal Insufficiency Stage 3   Degenerative disc disease, cervical    Depression    GERD (gastroesophageal reflux disease)    Hyperlipidemia    Hypertension    Mixed restrictive and obstructive lung disease (HCC)    Predominantly restriction due to chronic volume loss in the left lung.  Mild small airways disease.   Moderate mitral insufficiency    Monoclonal gammopathy    Polymyositis (HCC)    Pulmonary hypertension ([place])    Past Surgical History:  Procedure Laterality Date   APPENDECTOMY     BREAST EXCISIONAL BIOPSY Left 1990   negative   CATARACT EXTRACTION     COLONOSCOPY N/A 08/27/2016   Procedure: COLONOSCOPY;  Surgeon: Lollie Sails, MD;  Location: [facility] ENDOSCOPY;  Service: Endoscopy;  Laterality: N/A;   COLONOSCOPY WITH PROPOFOL N/A 07/22/2018   Procedure: COLONOSCOPY WITH PROPOFOL;  Surgeon: Lollie Sails, MD;  Location: [facility] ENDOSCOPY;  Service: Endoscopy;  Laterality: N/A;   HYSTEROSCOPY WITH D & C N/A 10/12/2019   Procedure: DILATATION AND CURETTAGE /HYSTEROSCOPY, POLYPECTOMY OR MYOMECTOMY;  Surgeon: Benjaman Kindler, MD;  Location: [facility] ORS;  Service: Gynecology;  Laterality: N/A;   JOINT REPLACEMENT     REPLACEMENT TOTAL HIP W/  RESURFACING IMPLANTS Right    THYMECTOMY     Family History  Adopted: Yes   Problem Relation Age of Onset   Breast cancer Neg Hx    Social History   Tobacco Use   Smoking status: Never   Smokeless tobacco: Never  Substance Use Topics   Alcohol use: No   Allergies  Allergen Reactions   Hctz [Hydrochlorothiazide] Other (See Comments)    Kidney Disorder     Review of Systems  Constitutional:  Positive for fatigue. Negative for appetite change.  HENT:  Positive for congestion. Negative for rhinorrhea and sore throat.   Eyes: Negative.   Respiratory:  Positive for shortness of breath (minimal). Negative for chest tightness.   Cardiovascular:  Negative for chest pain, palpitations and leg swelling.  Gastrointestinal:  Negative for abdominal distention and abdominal pain.  Endocrine: Negative.   Genitourinary: Negative.   Musculoskeletal:  Positive for back pain (chronic pain) and neck pain (at times).  Skin: Negative.   Allergic/Immunologic: Negative.   Neurological:  Negative for dizziness and light-headedness.       Memory loss  Hematological:  Negative for adenopathy. Does not bruise/bleed easily.  Psychiatric/Behavioral:  Positive for dysphoric mood and sleep disturbance (not sleeping well; sleeping on 1 pillow). The patient is not nervous/anxious.      Physical Exam Vitals and nursing note reviewed.  Constitutional:      Appearance: She is well-developed.  HENT:     Head: Normocephalic  and atraumatic.  Neck:     Vascular: No JVD.  Cardiovascular:     Rate and Rhythm: Regular rhythm. Bradycardia present.  Pulmonary:     Effort: Pulmonary effort is normal. No respiratory distress.     Breath sounds: No wheezing or rales.  Abdominal:     Tenderness: There is no abdominal tenderness.  Musculoskeletal:     Cervical back: Normal range of motion and neck supple.     Right lower leg: No tenderness. No edema.     Left lower leg: No tenderness. No edema.  Skin:    General: Skin is warm and dry.  Neurological:     Mental Status: She is alert  and oriented to person, place, and time.  Psychiatric:        Mood and Affect: Mood normal.        Behavior: Behavior normal.    Assessment & Plan:  1: Chronic heart failure with mildly reduced ejection fraction- - NYHA class II - euvolemic today - not weighing daily but does have working scales; emphasized the importance of weighing daily so that she can call for an overnight weight gain of >2 pounds or a weekly weight gain of >5 pounds - weight 177.6 pounds from last visit here 3 months ago - not adding salt to her food and has been reading food labels - saw cardiology Anna Romero) 06/06/21 - saw pulmonology Anna Romero) 12/16/2018 - discussed GDMT of SGLT2 as well as changing telmisartan to entresto - she asks to think about this and requests information on SGLT2 which was provided today - otherwise, will re-address this at her next visit - already taking furosemide PRN and last took it a couple of days ago - BNP 08/12/20 was 87.9  2: HTN- - BP  - saw PCP Anna Romero) 12/29/21 - BMP from 08/17/21 reviewed and showed sodium 140, potassium 4.8, creatinine 1.1 and GFR 59  3: Depression- - patient says that she's still seeing a counselor along with her psychiatrist - says that she's been walking for a mile every day which helps with her mental status  4: Endometrial cancer- - saw GYN Anna Romero) 10/24/21   Medication bottles reviewed.

## 2022-03-05 ENCOUNTER — Telehealth: Payer: Self-pay | Admitting: Family

## 2022-03-05 ENCOUNTER — Encounter: Payer: Medicare Other | Admitting: Speech Pathology

## 2022-03-05 ENCOUNTER — Ambulatory Visit: Payer: Medicare Other | Admitting: Family

## 2022-03-05 NOTE — Telephone Encounter (Signed)
Patient did not show for her Heart Failure Clinic appointment on 03/05/22. Will attempt to reschedule.

## 2022-03-07 ENCOUNTER — Encounter: Payer: Medicare Other | Admitting: Speech Pathology

## 2022-03-12 ENCOUNTER — Encounter: Payer: Medicare Other | Admitting: Speech Pathology

## 2022-03-13 ENCOUNTER — Encounter: Payer: Self-pay | Admitting: Family

## 2022-03-13 ENCOUNTER — Ambulatory Visit: Payer: Medicare Other | Attending: Family | Admitting: Family

## 2022-03-13 VITALS — BP 160/75 | HR 59 | Resp 18 | Ht 66.0 in | Wt 186.4 lb

## 2022-03-13 DIAGNOSIS — G8929 Other chronic pain: Secondary | ICD-10-CM | POA: Insufficient documentation

## 2022-03-13 DIAGNOSIS — I509 Heart failure, unspecified: Secondary | ICD-10-CM | POA: Diagnosis present

## 2022-03-13 DIAGNOSIS — R4189 Other symptoms and signs involving cognitive functions and awareness: Secondary | ICD-10-CM | POA: Diagnosis not present

## 2022-03-13 DIAGNOSIS — I272 Pulmonary hypertension, unspecified: Secondary | ICD-10-CM | POA: Diagnosis not present

## 2022-03-13 DIAGNOSIS — F32A Depression, unspecified: Secondary | ICD-10-CM | POA: Diagnosis not present

## 2022-03-13 DIAGNOSIS — F419 Anxiety disorder, unspecified: Secondary | ICD-10-CM | POA: Diagnosis not present

## 2022-03-13 DIAGNOSIS — E785 Hyperlipidemia, unspecified: Secondary | ICD-10-CM | POA: Diagnosis not present

## 2022-03-13 DIAGNOSIS — I5022 Chronic systolic (congestive) heart failure: Secondary | ICD-10-CM | POA: Diagnosis not present

## 2022-03-13 DIAGNOSIS — I130 Hypertensive heart and chronic kidney disease with heart failure and stage 1 through stage 4 chronic kidney disease, or unspecified chronic kidney disease: Secondary | ICD-10-CM | POA: Diagnosis present

## 2022-03-13 DIAGNOSIS — K219 Gastro-esophageal reflux disease without esophagitis: Secondary | ICD-10-CM | POA: Insufficient documentation

## 2022-03-13 DIAGNOSIS — N189 Chronic kidney disease, unspecified: Secondary | ICD-10-CM | POA: Diagnosis not present

## 2022-03-13 DIAGNOSIS — I10 Essential (primary) hypertension: Secondary | ICD-10-CM | POA: Diagnosis not present

## 2022-03-13 DIAGNOSIS — G479 Sleep disorder, unspecified: Secondary | ICD-10-CM | POA: Insufficient documentation

## 2022-03-13 DIAGNOSIS — Z79899 Other long term (current) drug therapy: Secondary | ICD-10-CM | POA: Insufficient documentation

## 2022-03-13 DIAGNOSIS — F329 Major depressive disorder, single episode, unspecified: Secondary | ICD-10-CM

## 2022-03-13 NOTE — Patient Instructions (Addendum)
Resume weighing daily and call for an overnight weight gain of 3 pounds or more or a weekly weight gain of more than 5 pounds.   If you have voicemail, please make sure your mailbox is cleaned out so that we may leave a message and please make sure to listen to any voicemails.    Drink 60-64 ounces of fluid daily   Call us in the future if you need [place] for anything.

## 2022-03-13 NOTE — Progress Notes (Signed)
Patient ID: Anna Romero, female    DOB: [birth year], 71 y.o.   MRN: [id number]  HPI  Anna Romero is a 71 y/o female with a history of hyperlipidemia, HTN, CKD, pulmonary HTN, depression, GERD, anxiety and chronic heart failure.   Echo report from 11/30/21 reviewed and showed an EF of 45-50%. Echo report from 07/09/17 reviewed and showed an EF of 40% along with moderate MR/TR.  Was in the ED 12/04/21 due to bug bites where she was evaluated and released.     She presents today for a follow-up visit with a chief complaint of minimal shortness of breath upon moderate exertion. She describes this as chronic in nature having been present for several years. She has associated fatigue, pedal edema, depression, chronic difficulty sleeping, chronic pain and gradual weight gain along with this. She denies any dizziness, cough, chest pain, palpitations or abdominal distention.   She admits to being under quite a bit of stress as she's currently splitting her time between living with her husband and living with her daughter. She was living at her families homeplace but it now needs a new septic system.   She says that her cardiologist has decreased her carvedilol because her heart rate was low.   She is drinking 1 bottle of water and that's all for the day.   Past Medical History:  Diagnosis Date   Anxiety    Asthma    CHF (congestive heart failure) (HCC)    Chronic kidney disease    Renal Insufficiency Stage 3   Degenerative disc disease, cervical    Depression    GERD (gastroesophageal reflux disease)    Hyperlipidemia    Hypertension    Mixed restrictive and obstructive lung disease (HCC)    Predominantly restriction due to chronic volume loss in the left lung.  Mild small airways disease.   Moderate mitral insufficiency    Monoclonal gammopathy    Polymyositis (HCC)    Pulmonary hypertension ([place])    Past Surgical History:  Procedure Laterality Date   APPENDECTOMY     BREAST  EXCISIONAL BIOPSY Left 1990   negative   CATARACT EXTRACTION     COLONOSCOPY N/A 08/27/2016   Procedure: COLONOSCOPY;  Surgeon: Lollie Sails, MD;  Location: [facility] ENDOSCOPY;  Service: Endoscopy;  Laterality: N/A;   COLONOSCOPY WITH PROPOFOL N/A 07/22/2018   Procedure: COLONOSCOPY WITH PROPOFOL;  Surgeon: Lollie Sails, MD;  Location: [facility] ENDOSCOPY;  Service: Endoscopy;  Laterality: N/A;   HYSTEROSCOPY WITH D & C N/A 10/12/2019   Procedure: DILATATION AND CURETTAGE /HYSTEROSCOPY, POLYPECTOMY OR MYOMECTOMY;  Surgeon: Benjaman Kindler, MD;  Location: [facility] ORS;  Service: Gynecology;  Laterality: N/A;   JOINT REPLACEMENT     REPLACEMENT TOTAL HIP W/  RESURFACING IMPLANTS Right    THYMECTOMY     Family History  Adopted: Yes  Problem Relation Age of Onset   Breast cancer Neg Hx    Social History   Tobacco Use   Smoking status: Never   Smokeless tobacco: Never  Substance Use Topics   Alcohol use: No   Allergies  Allergen Reactions   Hctz [Hydrochlorothiazide] Other (See Comments)    Kidney Disorder   Prior to Admission medications   Medication Sig Start Date End Date Taking? Authorizing Provider  acetaminophen (TYLENOL) 500 MG tablet Take 1,000 mg by mouth every 8 (eight) hours as needed for moderate pain. Needs daily   Yes [provider]  buPROPion (WELLBUTRIN SR) 200 MG 12 hr  tablet Take 200 mg by mouth every morning.  09/15/19  Yes [provider]  carvedilol (COREG) 6.25 MG tablet Take 6.25 mg by mouth 2 (two) times daily. 10/05/21  Yes [provider]  clonazePAM (KLONOPIN) 0.5 MG tablet Take 0.5 mg by mouth 2 (two) times daily as needed for anxiety.   Yes [provider]  donepezil (ARICEPT) 10 MG tablet Take 10 mg by mouth at bedtime.   Yes [provider]  Fluticasone-Umeclidin-Vilant (TRELEGY ELLIPTA) 100-62.5-25 MCG/ACT AEPB Inhale 100 mcg into the lungs daily. 05/23/21  Yes Tyler Pita, MD  furosemide (LASIX) 20 MG tablet  Take 20 mg by mouth daily as needed for fluid. Takes every few days   Yes [provider]  memantine (NAMENDA) 10 MG tablet Take 5 mg by mouth 2 (two) times daily. 09/20/19  Yes [provider]  Multiple Vitamins-Minerals (MULTIVITAMIN WITH MINERALS) tablet Take 1 tablet by mouth daily.   Yes [provider]  spironolactone (ALDACTONE) 25 MG tablet Take 25 mg by mouth daily.   Yes [provider]  Suvorexant (BELSOMRA PO) Take 10 mg by mouth every evening. Takes a few nights per week   Yes [provider]  telmisartan (MICARDIS) 40 MG tablet Take 40 mg by mouth daily.   Yes [provider]  albuterol (VENTOLIN HFA) 108 (90 Base) MCG/ACT inhaler INHALE 1-2 PUFFS BY MOUTH EVERY 6 HOURS AS NEEDED FOR WHEEZE OR SHORTNESS OF BREATH Patient not taking: Reported on 11/30/2021 11/22/21   Tyler Pita, MD   Review of Systems  Constitutional:  Positive for fatigue. Negative for appetite change.  HENT:  Positive for congestion. Negative for rhinorrhea and sore throat.   Eyes: Negative.   Respiratory:  Positive for shortness of breath (minimal). Negative for cough and chest tightness.   Cardiovascular:  Positive for leg swelling (at times). Negative for chest pain and palpitations.  Gastrointestinal:  Negative for abdominal distention and abdominal pain.  Endocrine: Negative.   Genitourinary: Negative.   Musculoskeletal:  Positive for back pain (chronic pain) and neck pain (at times).  Skin: Negative.   Allergic/Immunologic: Negative.   Neurological:  Negative for dizziness and light-headedness.       Memory loss  Hematological:  Negative for adenopathy. Does not bruise/bleed easily.  Psychiatric/Behavioral:  Positive for dysphoric mood and sleep disturbance (not sleeping well; sleeping on 1 pillow). The patient is not nervous/anxious.    Vitals:   03/13/22 1201  BP: (!) 160/75  Pulse: (!) 59  Resp: 18  SpO2: 100%  Weight: 186 lb 6 oz (84.5  kg)  Height: '5\' 6"'$  (1.676 m)   Wt Readings from Last 3 Encounters:  03/13/22 186 lb 6 oz (84.5 kg)  12/03/21 180 lb (81.6 kg)  11/30/21 177 lb 6 oz (80.5 kg)   Lab Results  Component Value Date   CREATININE 1.02 (H) 03/25/2021   CREATININE 0.93 08/12/2020   CREATININE 1.04 (H) 10/08/2019   Physical Exam Vitals and nursing note reviewed.  Constitutional:      Appearance: She is well-developed.  HENT:     Head: Normocephalic and atraumatic.  Neck:     Vascular: No JVD.  Cardiovascular:     Rate and Rhythm: Regular rhythm. Bradycardia present.  Pulmonary:     Effort: Pulmonary effort is normal. No respiratory distress.     Breath sounds: No wheezing or rales.  Abdominal:     Tenderness: There is no abdominal tenderness.  Musculoskeletal:  General: No tenderness.     Cervical back: Normal range of motion and neck supple.     Right lower leg: No tenderness. Edema (trace pitting) present.     Left lower leg: No tenderness. Edema (trace pitting) present.  Skin:    General: Skin is warm and dry.  Neurological:     Mental Status: She is alert and oriented to person, place, and time.  Psychiatric:        Mood and Affect: Mood normal.        Behavior: Behavior normal.    Assessment & Plan:  1: Chronic heart failure with mildly reduced ejection fraction- - NYHA class II - euvolemic today - not weighing daily but does have working scales; emphasized the importance of weighing daily so that she can call for an overnight weight gain of >2 pounds or a weekly weight gain of >5 pounds - weight up 9 pounds from last visit here 3 months ago - admits to not getting any exercise and not watching what she eats very closely - not adding salt to her food and has been reading food labels; has been eating salt and vinegar potato chips at time and is aware of the high sodium content - saw cardiology Petra Kuba) 03/06/22 - saw pulmonology Alva Garnet) 12/16/2018 - she still isn't ready to  begin SGLT2 and will continue this conversation with cardiologist - already taking furosemide PRN and last took it a couple of days ago - only drinking 1 16.9 ounce water bottle a day and nothing else; reviewed the importance of getting her daily fluid intake to 60-64 ounces - BNP 08/12/20 was 87.9  2: HTN- - BP elevated (160/75) but she rushed here after seeing her PCP - saw PCP Tressia Miners) earlier today - BMP from 08/17/21 reviewed and showed sodium 140, potassium 4.8, creatinine 1.1 and GFR 59  3: Depression- - patient says that she's still seeing a counselor along with her psychiatrist - says that she needs to get back to her daily walking  4: 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   Will not make a return appointment at this time. Advised patient to follow closely with cardiology and her PCP but that she could call back at anytime for questions or to make another appointment and she was comfortable with this plan.

## 2022-03-14 ENCOUNTER — Other Ambulatory Visit: Payer: Self-pay | Admitting: Internal Medicine

## 2022-03-14 ENCOUNTER — Encounter: Payer: Medicare Other | Admitting: Speech Pathology

## 2022-03-14 DIAGNOSIS — Z1231 Encounter for screening mammogram for malignant neoplasm of breast: Secondary | ICD-10-CM

## 2022-03-19 ENCOUNTER — Encounter: Payer: Medicare Other | Admitting: Speech Pathology

## 2022-03-21 ENCOUNTER — Encounter: Payer: Medicare Other | Admitting: Speech Pathology

## 2022-03-21 ENCOUNTER — Inpatient Hospital Stay: Payer: Medicare Other | Attending: Obstetrics and Gynecology | Admitting: Obstetrics and Gynecology

## 2022-03-21 ENCOUNTER — Encounter: Payer: Self-pay | Admitting: Obstetrics and Gynecology

## 2022-03-21 VITALS — BP 132/76 | HR 68 | Temp 96.6°F | Resp 20 | Wt 186.3 lb

## 2022-03-21 DIAGNOSIS — C541 Malignant neoplasm of endometrium: Secondary | ICD-10-CM

## 2022-03-21 DIAGNOSIS — N952 Postmenopausal atrophic vaginitis: Secondary | ICD-10-CM | POA: Diagnosis not present

## 2022-03-21 DIAGNOSIS — N9089 Other specified noninflammatory disorders of vulva and perineum: Secondary | ICD-10-CM

## 2022-03-21 DIAGNOSIS — Z79890 Hormone replacement therapy (postmenopausal): Secondary | ICD-10-CM | POA: Insufficient documentation

## 2022-03-21 DIAGNOSIS — Z08 Encounter for follow-up examination after completed treatment for malignant neoplasm: Secondary | ICD-10-CM | POA: Diagnosis not present

## 2022-03-21 DIAGNOSIS — N941 Unspecified dyspareunia: Secondary | ICD-10-CM | POA: Insufficient documentation

## 2022-03-21 DIAGNOSIS — Z90710 Acquired absence of both cervix and uterus: Secondary | ICD-10-CM | POA: Insufficient documentation

## 2022-03-21 DIAGNOSIS — Z90722 Acquired absence of ovaries, bilateral: Secondary | ICD-10-CM | POA: Insufficient documentation

## 2022-03-21 DIAGNOSIS — Z8542 Personal history of malignant neoplasm of other parts of uterus: Secondary | ICD-10-CM | POA: Diagnosis not present
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
## 2022-03-21 NOTE — Patient Instructions (Addendum)
Please contact Dr Migdalia Dk office for follow up in 4 months and we will see you in 8 months. I'll call you with results of the biopsy when it's back. Please let me know if you have any complications.   Please consider healthy vulval hygiene practices as noted below.     Avoid   Pantyhose   Synthetic underwear  Jeans and other tight pants  Swimsuits, leotards, thongs, lycra garments  Pantyliners  Scented soaps or shampoos  Bubble bath   Scented detergents   Washcloths  Feminine sprays, douches, powders   Dyed toilet articles   Hair dryers to dry vulva skin without contact       Substitute   Stockings with a garter belt  Thigh-high or knee-high stockings  Cotton underwear or no underwear  Loose pants, skirts, dresses  Loose-fitting cotton garments  Tampons or cotton pads  Fragrance-free pH neutral soap (eg, Basis, Neutrogena, Dove soap)  Tub baths in the morning and at night without additives and at a comfortable temperature Unscented detergents Use fingertips for washing; pat dry, don't rub dry  These are not necessary products and can be omitted from personal practices Toilet articles without dyes Dry vulva by gentle patting

## 2022-03-23 LAB — SURGICAL PATHOLOGY

## 2022-03-26 ENCOUNTER — Telehealth: Payer: Self-pay | Admitting: Nurse Practitioner

## 2022-03-26 NOTE — Telephone Encounter (Signed)
Answering service message states Anna Romero would like someone to call about her test results on Nov.1,2023.

## 2022-03-27 ENCOUNTER — Telehealth: Payer: Self-pay

## 2022-03-27 ENCOUNTER — Telehealth: Payer: Self-pay | Admitting: *Deleted

## 2022-03-27 NOTE — Telephone Encounter (Signed)
Patient called asking for results   Surgical pathology Order: [id number] Status: Edited Result - FINAL     Visible to patient: No (inaccessible in MyChart)     Next appt: 11/21/2022 at 03:00 PM in Oncology ([place])     Dx: Encounter for follow-up surveillance ...   0 Result Notes    Component 6 d ago  SURGICAL PATHOLOGY SURGICAL PATHOLOGY CASE: [id number] PATIENT: Anna Romero Surgical Pathology Report     Specimen Submitted: A. Vulva, left 2:30  Clinical History: Encounter for follow up surveillance of endometrial cancer - ([id number].Z85.42) - Primary.  Endometrial cancer HCC C54.1    DIAGNOSIS: A. VULVA, LEFT AT 230 O'CLOCK; BIOPSY: - BENIGN SQUAMOUS MUCOSA WITH MILD EPIDERMAL HYPERPLASIA, SPONGIOSIS, AND HYPERGRANULOSIS. - NEGATIVE FOR SQUAMOUS INTRAEPITHELIAL LESION AND MALIGNANCY.  Comment: The clinical concerns for dysplasia and lichen sclerosus are noted. Typical features of lichen sclerosis, including epidermal atrophy, and superficial dermal sclerosis/homogenization are not identified.  The features present in the current biopsy are more suggestive of chronic irritation or lichen simplex chronicus.  Clinical correlation is recommended.  GROSS DESCRIPTION: A. Labeled: Left vulva (per requisition 2:30 left vulva) Received: Formalin Collection time: 2:10 PM on 03/21/2022 Placed into formalin time: 2:10 PM on 03/21/2022 Tissue fragment(s): 2 Size: Range from 0.2-0.3 cm Description: Received are 2 fragments of tan-brown soft tissue and potential skin. Entirely submitted in 1 cassette.  RB 03/22/2022  Final Diagnosis performed by Allena Napoleon, MD.   Electronically signed 03/23/2022 9:12:23AM The electronic signature indicates that the named Attending Pathologist has evaluated the specimen Technical component performed at [facility], [address], [place], [place] [address] Lab: [phone] Dir: Rush Farmer, MD, MMM  Professional component performed at  [facility], [facility], [place], [place], [place] [address] Lab: [phone] Dir: Kathi Simpers, MD  Resulting Agency [facility]         Specimen Collected: 03/21/22 16:10 Last Resulted: 03/23/22 09:29

## 2022-03-27 NOTE — Telephone Encounter (Signed)
Dr. Theora Gianotti has reviewed biopsy results. Recommends to follow up with Dr. Leafy Ro for this. Does not appear to have any reason to start steroid. Defers treatment to Dr. Leafy Ro. Continue vulva hygiene recommendations that she was given in her AVS.

## 2022-03-27 NOTE — Telephone Encounter (Signed)
Called to inform patient of results.. Per Patient spoke to Philip Aspen yesterday in regards to results. I apologized for the 2nd phone call and ensured her that her results came back good. Patient verbalized understanding.

## 2022-05-25 ENCOUNTER — Telehealth: Payer: Self-pay | Admitting: Pulmonary Disease

## 2022-05-25 NOTE — Telephone Encounter (Signed)
I spoke with the patient. She said she saw on TV that the pharmacies will not have Albuterol inhalers anymore. I suggested that she contact her pharmacy to see if they have heard anything about them not getting albuterol anymore. She will reach out to her pharmacy.  Nothing further needed.

## 2022-05-28 ENCOUNTER — Ambulatory Visit
Admission: RE | Admit: 2022-05-28 | Discharge: 2022-05-28 | Disposition: A | Discharge status: Home / Self Care | Payer: Self-pay | Source: Ambulatory Visit | Attending: Orthopedic Surgery | Admitting: Orthopedic Surgery

## 2022-05-28 ENCOUNTER — Other Ambulatory Visit: Payer: Self-pay

## 2022-05-28 DIAGNOSIS — Z049 Encounter for examination and observation for unspecified reason: Secondary | ICD-10-CM

## 2022-05-28 NOTE — Progress Notes (Deleted)
Referring Physician:  Steffanie Rainwater, MD [place],  [place] [address]  Primary Physician:  Gladstone Lighter, MD  History of Present Illness: 05/28/2022*** Ms. Charletta Maslak has a history of HTN, CHF, pulmonary hypertension, asthma, mixed restrictive/obstructive lung disease, GERD, CKD s, endometrial CA, polymyositis, OSA with CPAP, and hyperlipidemia.   Seen by ortho at [facility]) for right hip pain. History of right THA and revision. She was having intermittent right posterior thigh pain to her foot- pain resolved with prednisone.   She saw rheumatology on 03/29/22 and he referred her to physiatry for her lumbar spine.          Duration: *** Location: *** Quality: *** Severity: ***  Precipitating: aggravated by *** Modifying factors: made better by *** Weakness: none Timing: *** Bowel/Bladder Dysfunction: none  Conservative measures:  Physical therapy: ***  Multimodal medical therapy including regular antiinflammatories: prednisone, tramadol, tylenol, zanaflex Injections: *** epidural steroid injections  Past Surgery: ***  KHAMYAH STOHR has ***no symptoms of cervical myelopathy.  The symptoms are causing a significant impact on the patient's life.   Review of Systems:  A 10 point review of systems is negative, except for the pertinent positives and negatives detailed in the HPI.  Past Medical History: Past Medical History:  Diagnosis Date   Anxiety    Asthma    CHF (congestive heart failure) (HCC)    Chronic kidney disease    Renal Insufficiency Stage 3   Degenerative disc disease, cervical    Depression    GERD (gastroesophageal reflux disease)    Hyperlipidemia    Hypertension    Mixed restrictive and obstructive lung disease (HCC)    Predominantly restriction due to chronic volume loss in the left lung.  Mild small airways disease.   Moderate mitral insufficiency    Monoclonal gammopathy    Polymyositis (HCC)    Pulmonary  hypertension (HCC)     Past Surgical History: Past Surgical History:  Procedure Laterality Date   APPENDECTOMY     BREAST EXCISIONAL BIOPSY Left 1990   negative   CATARACT EXTRACTION     COLONOSCOPY N/A 08/27/2016   Procedure: COLONOSCOPY;  Surgeon: Lollie Sails, MD;  Location: [facility] ENDOSCOPY;  Service: Endoscopy;  Laterality: N/A;   COLONOSCOPY WITH PROPOFOL N/A 07/22/2018   Procedure: COLONOSCOPY WITH PROPOFOL;  Surgeon: Lollie Sails, MD;  Location: [facility] ENDOSCOPY;  Service: Endoscopy;  Laterality: N/A;   HYSTEROSCOPY WITH D & C N/A 10/12/2019   Procedure: DILATATION AND CURETTAGE /HYSTEROSCOPY, POLYPECTOMY OR MYOMECTOMY;  Surgeon: Benjaman Kindler, MD;  Location: [facility] ORS;  Service: Gynecology;  Laterality: N/A;   JOINT REPLACEMENT     REPLACEMENT TOTAL HIP W/  RESURFACING IMPLANTS Right    THYMECTOMY      Allergies: Allergies as of 05/31/2022 - Review Complete 03/21/2022  Allergen Reaction Noted   Hctz [hydrochlorothiazide] Other (See Comments) 04/06/2016    Medications: Outpatient Encounter Medications as of 05/31/2022  Medication Sig   acetaminophen (TYLENOL) 500 MG tablet Take 1,000 mg by mouth every 8 (eight) hours as needed for moderate pain. Needs daily   albuterol (VENTOLIN HFA) 108 (90 Base) MCG/ACT inhaler INHALE 1-2 PUFFS BY MOUTH EVERY 6 HOURS AS NEEDED FOR WHEEZE OR SHORTNESS OF BREATH (Patient not taking: Reported on 11/30/2021)   buPROPion (WELLBUTRIN SR) 200 MG 12 hr tablet Take 200 mg by mouth every morning.    carvedilol (COREG) 6.25 MG tablet Take 6.25 mg by mouth 2 (two) times daily.  clonazePAM (KLONOPIN) 0.5 MG tablet Take 0.5 mg by mouth 2 (two) times daily as needed for anxiety.   donepezil (ARICEPT) 10 MG tablet Take 10 mg by mouth at bedtime. (Patient not taking: Reported on 03/21/2022)   Fluticasone-Umeclidin-Vilant (TRELEGY ELLIPTA) 100-62.5-25 MCG/ACT AEPB Inhale 100 mcg into the lungs daily.   furosemide (LASIX) 20 MG tablet Take 20 mg by  mouth daily as needed for fluid. Takes every few days   memantine (NAMENDA) 10 MG tablet Take 5 mg by mouth 2 (two) times daily.   Multiple Vitamins-Minerals (MULTIVITAMIN WITH MINERALS) tablet Take 1 tablet by mouth daily.   spironolactone (ALDACTONE) 25 MG tablet Take 25 mg by mouth daily.   Suvorexant (BELSOMRA PO) Take 10 mg by mouth every evening. Takes a few nights per week   telmisartan (MICARDIS) 40 MG tablet Take 40 mg by mouth daily.   No facility-administered encounter medications on file as of 05/31/2022.    Social History: Social History   Tobacco Use   Smoking status: Never   Smokeless tobacco: Never  Vaping Use   Vaping Use: Never used  Substance Use Topics   Alcohol use: No   Drug use: No    Family Medical History: Family History  Adopted: Yes  Problem Relation Age of Onset   Breast cancer Neg Hx     Physical Examination: There were no vitals filed for this visit.  General: Patient is well developed, well nourished, calm, collected, and in no apparent distress. Attention to examination is appropriate.  Respiratory: Patient is breathing without any difficulty.   NEUROLOGICAL:     Awake, alert, oriented to person, place, and time.  Speech is clear and fluent. Fund of knowledge is appropriate.   Cranial Nerves: Pupils equal round and reactive to light.  Facial tone is symmetric.  Facial sensation is symmetric.  ROM of spine:  *** ROM of cervical spine *** pain *** ROM of lumbar spine *** pain  No abnormal lesions on exposed skin.   Strength: Side Biceps Triceps Deltoid Interossei Grip Wrist Ext. Wrist Flex.  R 5 5 5 5 5 5 5  $ L 5 5 5 5 5 5 5   $ Side Iliopsoas Quads Hamstring PF DF EHL  R 5 5 5 5 5 5  $ L 5 5 5 5 5 5   $ Reflexes are ***2+ and symmetric at the biceps, triceps, brachioradialis, patella and achilles.   Hoffman's is absent.  Clonus is not present.   Bilateral upper and lower extremity sensation is intact to light touch.    No evidence  of dysmetria noted.  Gait is normal.   ***No difficulty with tandem gait.    Medical Decision Making  Imaging: Lumbar spine xrays dated 03/29/22:  Slight right sided scoliosis with diffuse mild lumbar DDD and spondylosis.   Radiology report not available for my review. ***  Assessment and Plan: Ms. Vanginkel is a pleasant 72 y.o. female with ***  Treatment options discussed with patient and following plan made:   - Order for physical therapy for *** spine ***. - Continue on current medications including ***. Reviewed proper dosing along with risks and benefits. Take and NSAIDs with food.      I spent a total of *** minutes in face-to-face and non-face-to-face activities related to this patient's care today including review of outside records, review of imaging, review of symptoms, physical exam, discussion of differential diagnosis, discussion of treatment options, and documentation.   Thank you for involving me in  the care of this patient.   Geronimo Boot PA-C Dept. of Neurosurgery

## 2022-05-31 ENCOUNTER — Ambulatory Visit: Payer: 59 | Admitting: Orthopedic Surgery

## 2022-06-01 IMAGING — US US BREAST*R* LIMITED INC AXILLA
1 series · 12 of 12 positions shown · non-contrast
Comparison: Previous exams including recent screening mammogram
dated 07/07/2020.

CLINICAL DATA: Patient returns today to evaluate possible RIGHT
breast masses identified on recent screening mammogram.

EXAM:
DIGITAL DIAGNOSTIC UNILATERAL RIGHT MAMMOGRAM WITH TOMOSYNTHESIS AND
CAD; ULTRASOUND RIGHT BREAST LIMITED
TECHNIQUE: Right digital diagnostic mammography and breast tomosynthesis was
performed. The images were evaluated with computer-aided detection.;
Targeted ultrasound examination of the right breast was performed

[Series 1: us breast*right* limited inc axilla · 0.05mm/px · 12 of 12 slices shown]
[im 1/12]
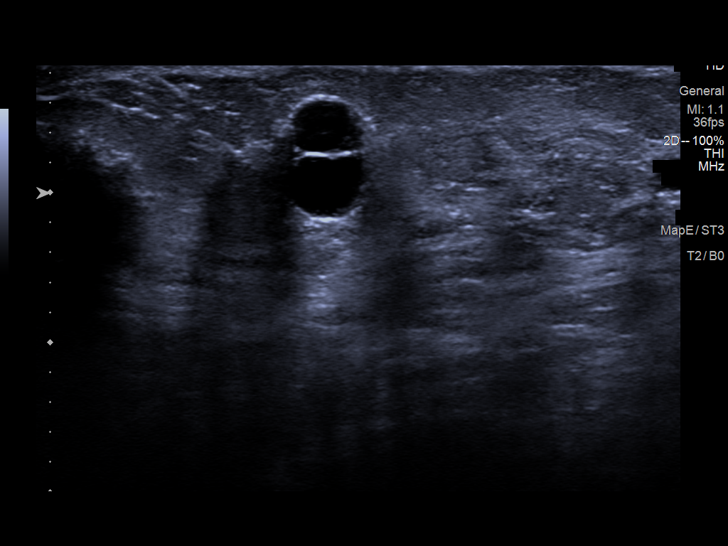
[im 2/12]
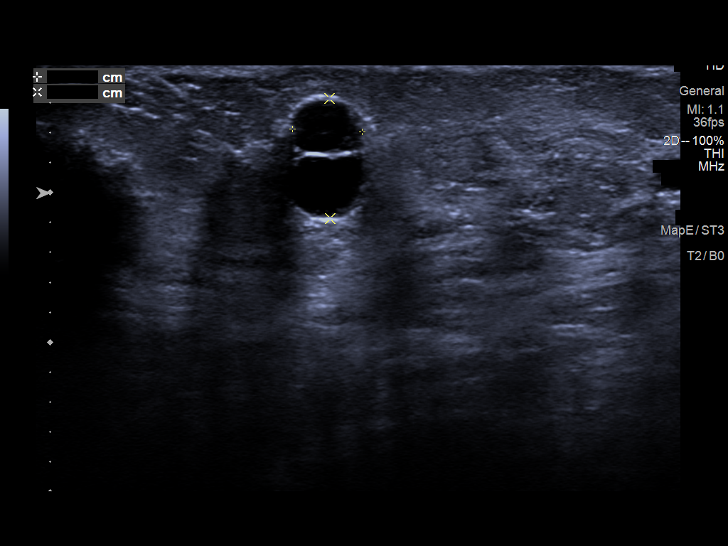
[im 3/12]
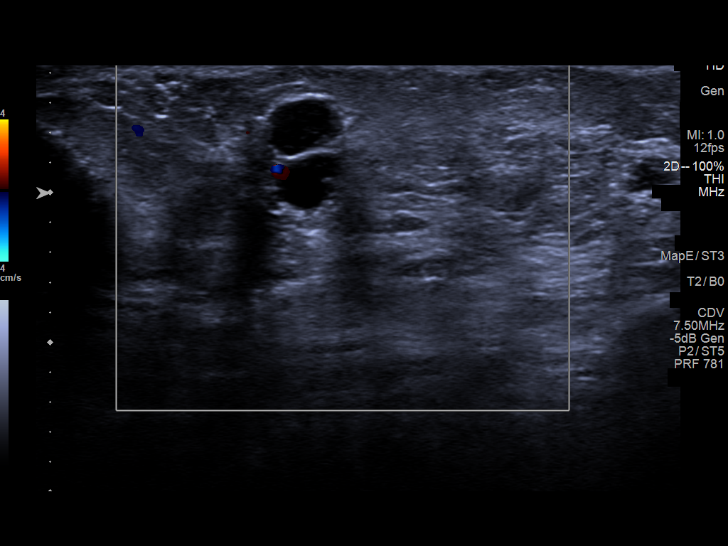
[im 4/12]
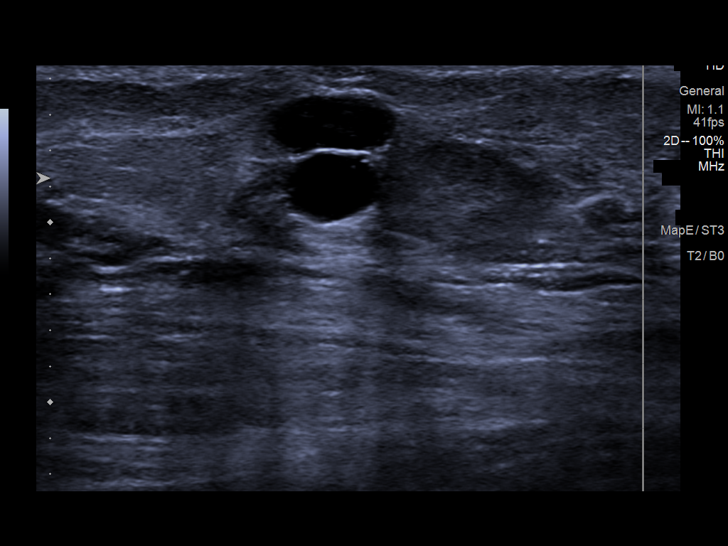
[im 5/12]
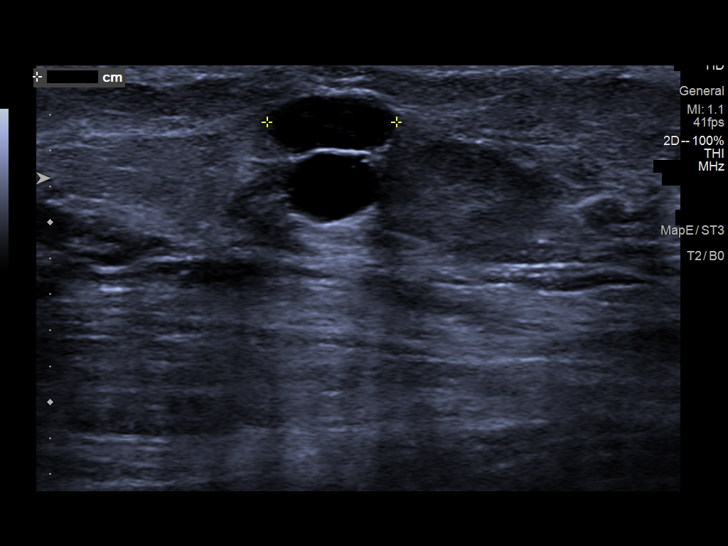
[im 6/12]
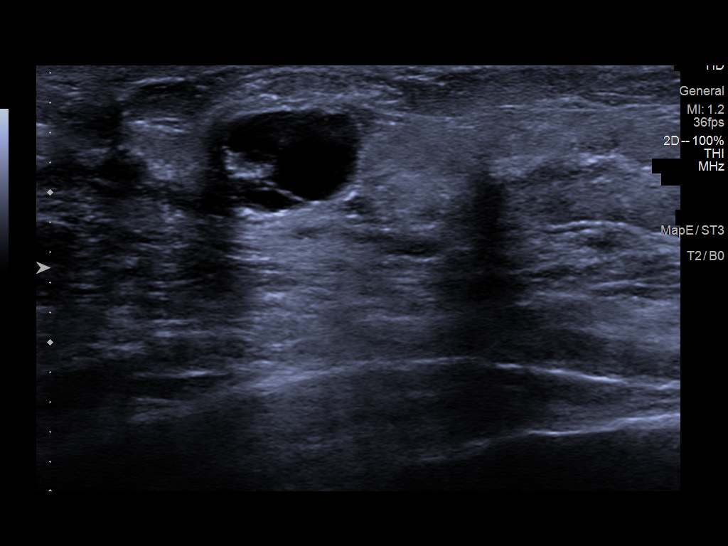
[im 7/12]
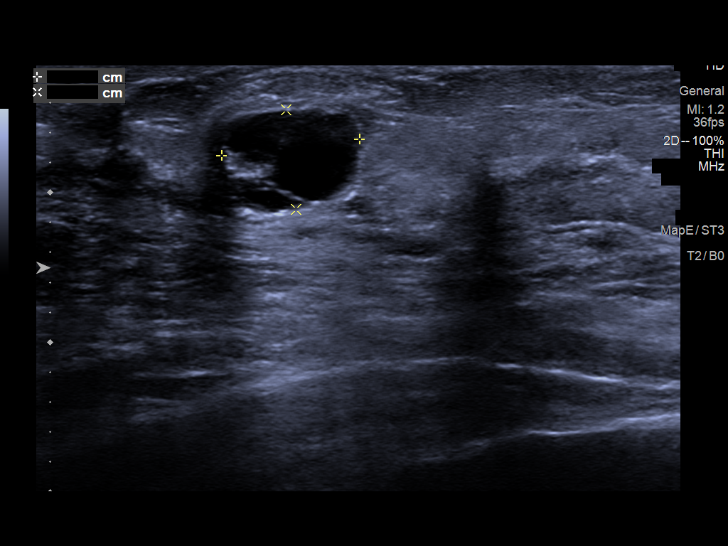
[im 8/12]
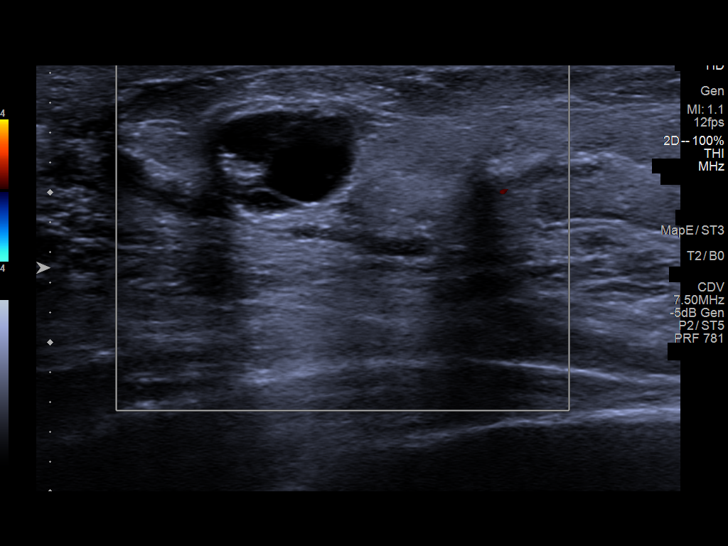
[im 9/12]
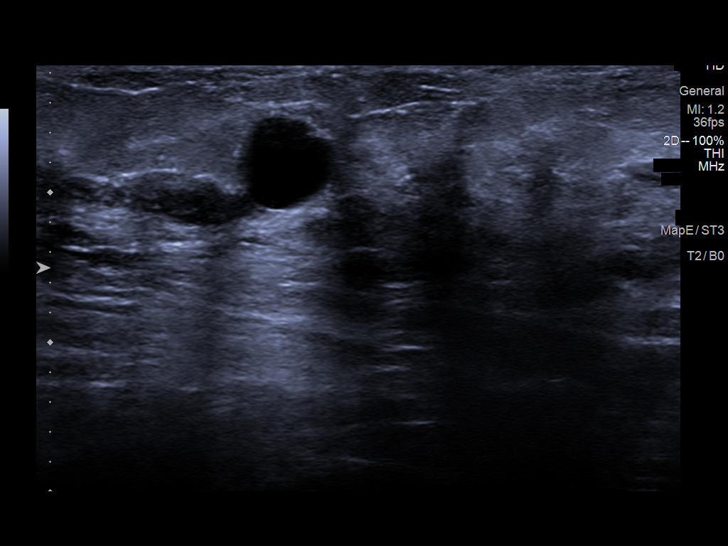
[im 10/12]
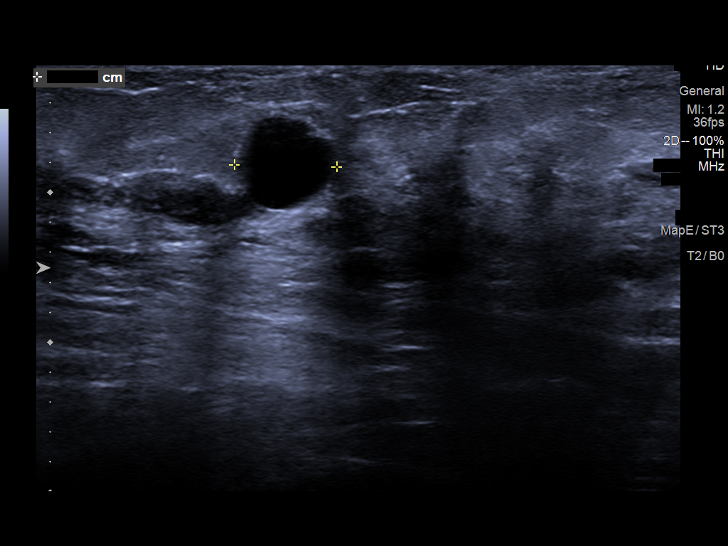
[im 11/12]
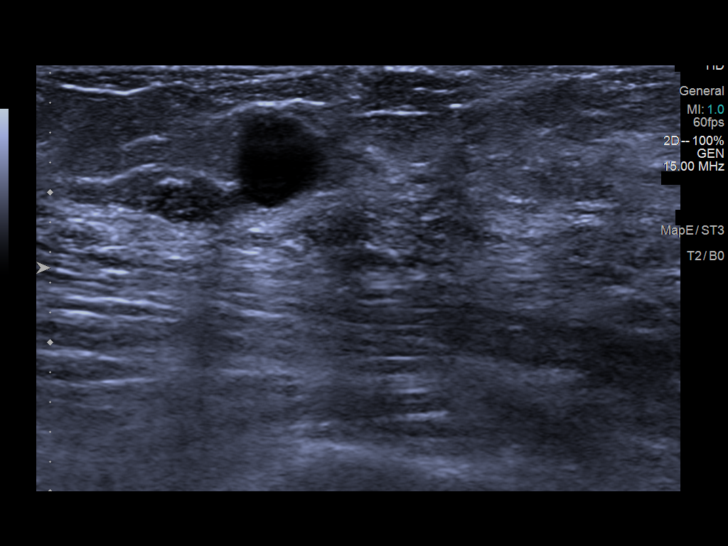
[im 12/12]
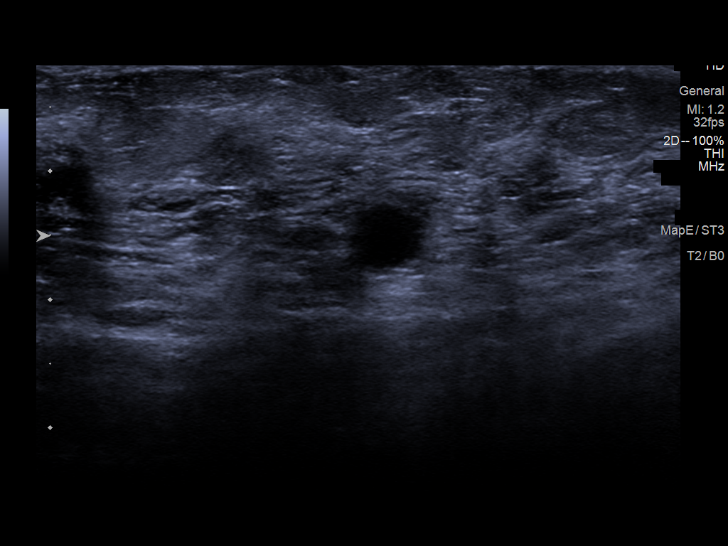

[12 of 12 positions shown; findings below may reference images not displayed]

ACR Breast Density Category c: The breast tissue is heterogeneously
dense, which may obscure small masses.
FINDINGS: On today's additional diagnostic views, including spot compression
with 3D tomosynthesis, 2 oval circumscribed low-density masses are
confirmed within the lower RIGHT breast, 5-6 o'clock axis region,
largest measuring approximately 8 mm greatest dimension.

Targeted ultrasound is performed, showing benign cysts within the
RIGHT breast at the 5 o'clock and 6 o'clock axes, measuring 9 mm and
8 mm respectively, corresponding to the mammographic finding.
IMPRESSION: No evidence of malignancy. Benign cysts within the lower RIGHT
breast, largest measuring 9 mm, corresponding to the mammographic
findings.

Patient may return to routine annual bilateral screening mammogram
schedule.

RECOMMENDATION:
Screening mammogram in one year.(Code:S8-I-3DD)

I have discussed the findings and recommendations with the patient.
If applicable, a reminder letter will be sent to the patient
regarding the next appointment.

BI-RADS CATEGORY  2: Benign.

## 2022-06-07 ENCOUNTER — Encounter: Payer: Self-pay | Admitting: Orthopedic Surgery

## 2022-06-07 ENCOUNTER — Ambulatory Visit (INDEPENDENT_AMBULATORY_CARE_PROVIDER_SITE_OTHER): Payer: 59 | Admitting: Orthopedic Surgery

## 2022-06-07 VITALS — BP 128/82 | Ht 66.0 in | Wt 187.4 lb

## 2022-06-07 DIAGNOSIS — M5136 Other intervertebral disc degeneration, lumbar region: Secondary | ICD-10-CM

## 2022-06-07 DIAGNOSIS — M47816 Spondylosis without myelopathy or radiculopathy, lumbar region: Secondary | ICD-10-CM

## 2022-06-07 NOTE — Patient Instructions (Signed)
It was so nice to see you today, I am glad you are feeling better.   Your lower back xrays showed some wear and tear (arthritis). This is likely causing your back pain and may be causing the right thigh pain.   If you continue to have no pain, then I would just watch things for now.   If pain comes back, we can consider physical therapy and/or a lumbar MRI scan. Depending on what I saw on the MRI scan, we could consider possible injections.   We will call you in a month to check on you. If things get worse, please call me.   Please do not hesitate to call if you have any questions or concerns. You can also message me in [place].   Geronimo Boot PA-C [phone]

## 2022-06-07 NOTE — Progress Notes (Signed)
Referring Physician:  Steffanie Rainwater, MD [place],  [place] [address]  Primary Physician:  Gladstone Lighter, MD  History of Present Illness: 06/07/2022 Ms. Anna Romero has a history of HTN, CHF, pulmonary hypertension, asthma, mixed restrictive/obstructive lung disease, GERD, CKD s, endometrial CA, polymyositis, OSA with CPAP, and hyperlipidemia.   Seen by ortho at [facility]) for right hip pain. History of right THA and revision. She was having intermittent right posterior thigh pain to her foot- pain resolved with prednisone.   She saw rheumatology on 03/29/22 and he referred her to physiatry for her lumbar spine.   She has intermittent LBP with intermittent right lateral thigh pain that was really bad about a month ago. Since then, she's had no significant pain. No specific alleviating/aggravating factors. No numbness, tingling, or weakness. No bowel or bladder issues.   Conservative measures:  Physical therapy: none  Multimodal medical therapy including regular antiinflammatories: prednisone, tramadol, tylenol, zanaflex Injections: no epidural steroid injections  Past Surgery: no spinal surgery  AYSHA LIVECCHI has no symptoms of cervical myelopathy.  The symptoms are causing a significant impact on the patient's life.   Review of Systems:  A 10 point review of systems is negative, except for the pertinent positives and negatives detailed in the HPI.  Past Medical History: Past Medical History:  Diagnosis Date   Anxiety    Asthma    CHF (congestive heart failure) (HCC)    Chronic kidney disease    Renal Insufficiency Stage 3   Degenerative disc disease, cervical    Depression    GERD (gastroesophageal reflux disease)    Hyperlipidemia    Hypertension    Mixed restrictive and obstructive lung disease (HCC)    Predominantly restriction due to chronic volume loss in the left lung.  Mild small airways disease.   Moderate mitral insufficiency     Monoclonal gammopathy    Polymyositis (HCC)    Pulmonary hypertension (HCC)     Past Surgical History: Past Surgical History:  Procedure Laterality Date   APPENDECTOMY     BREAST EXCISIONAL BIOPSY Left 1990   negative   CATARACT EXTRACTION     COLONOSCOPY N/A 08/27/2016   Procedure: COLONOSCOPY;  Surgeon: Lollie Sails, MD;  Location: [facility] ENDOSCOPY;  Service: Endoscopy;  Laterality: N/A;   COLONOSCOPY WITH PROPOFOL N/A 07/22/2018   Procedure: COLONOSCOPY WITH PROPOFOL;  Surgeon: Lollie Sails, MD;  Location: [facility] ENDOSCOPY;  Service: Endoscopy;  Laterality: N/A;   HYSTEROSCOPY WITH D & C N/A 10/12/2019   Procedure: DILATATION AND CURETTAGE /HYSTEROSCOPY, POLYPECTOMY OR MYOMECTOMY;  Surgeon: Benjaman Kindler, MD;  Location: [facility] ORS;  Service: Gynecology;  Laterality: N/A;   JOINT REPLACEMENT     REPLACEMENT TOTAL HIP W/  RESURFACING IMPLANTS Right    THYMECTOMY      Allergies: Allergies as of 06/07/2022 - Review Complete 03/21/2022  Allergen Reaction Noted   Hctz [hydrochlorothiazide] Other (See Comments) 04/06/2016    Medications: Outpatient Encounter Medications as of 06/07/2022  Medication Sig   acetaminophen (TYLENOL) 500 MG tablet Take 1,000 mg by mouth every 8 (eight) hours as needed for moderate pain. Needs daily   albuterol (VENTOLIN HFA) 108 (90 Base) MCG/ACT inhaler INHALE 1-2 PUFFS BY MOUTH EVERY 6 HOURS AS NEEDED FOR WHEEZE OR SHORTNESS OF BREATH (Patient not taking: Reported on 11/30/2021)   buPROPion (WELLBUTRIN SR) 200 MG 12 hr tablet Take 200 mg by mouth every morning.    carvedilol (COREG) 6.25 MG tablet Take  6.25 mg by mouth 2 (two) times daily.   clonazePAM (KLONOPIN) 0.5 MG tablet Take 0.5 mg by mouth 2 (two) times daily as needed for anxiety.   donepezil (ARICEPT) 10 MG tablet Take 10 mg by mouth at bedtime. (Patient not taking: Reported on 03/21/2022)   Fluticasone-Umeclidin-Vilant (TRELEGY ELLIPTA) 100-62.5-25 MCG/ACT AEPB Inhale 100 mcg into the lungs  daily.   furosemide (LASIX) 20 MG tablet Take 20 mg by mouth daily as needed for fluid. Takes every few days   memantine (NAMENDA) 10 MG tablet Take 5 mg by mouth 2 (two) times daily.   Multiple Vitamins-Minerals (MULTIVITAMIN WITH MINERALS) tablet Take 1 tablet by mouth daily.   spironolactone (ALDACTONE) 25 MG tablet Take 25 mg by mouth daily.   Suvorexant (BELSOMRA PO) Take 10 mg by mouth every evening. Takes a few nights per week   telmisartan (MICARDIS) 40 MG tablet Take 40 mg by mouth daily.   No facility-administered encounter medications on file as of 06/07/2022.    Social History: Social History   Tobacco Use   Smoking status: Never   Smokeless tobacco: Never  Vaping Use   Vaping Use: Never used  Substance Use Topics   Alcohol use: No   Drug use: No    Family Medical History: Family History  Adopted: Yes  Problem Relation Age of Onset   Breast cancer Neg Hx     Physical Examination: There were no vitals filed for this visit.  General: Patient is well developed, well nourished, calm, collected, and in no apparent distress. Attention to examination is appropriate.  Respiratory: Patient is breathing without any difficulty.   NEUROLOGICAL:     Awake, alert, oriented to person, place, and time.  Speech is clear and fluent. Fund of knowledge is appropriate.   Cranial Nerves: Pupils equal round and reactive to light.  Facial tone is symmetric.    No lower lumbar tenderness. Reasonable ROM of lumbar spine with no pain.   No abnormal lesions on exposed skin.   Strength: Side Biceps Triceps Deltoid Interossei Grip Wrist Ext. Wrist Flex.  R '5 5 5 5 5 5 5  '$ L '5 5 5 5 5 5 5   '$ Side Iliopsoas Quads Hamstring PF DF EHL  R '5 5 5 5 5 5  '$ L '5 5 5 5 5 5   '$ Reflexes are 2+ and symmetric at the biceps, triceps, brachioradialis, patella and achilles.   Hoffman's is absent.  Clonus is not present.   Bilateral upper and lower extremity sensation is intact to light touch.      No pain with IR/ER of right hip.   Gait is normal.    Medical Decision Making  Imaging: Lumbar spine xrays dated 03/29/22:  Slight right sided scoliosis with diffuse mild lumbar DDD and spondylosis.   Radiology report not available for my review.   Assessment and Plan: Ms. Urey is a pleasant 72 y.o. female with intermittent right lateral thigh pain that was really bad about a month ago. Since then, she's had no significant pain. No numbness, tingling, or weakness.   She has known slight right sided scoliosis with diffuse mild lumbar DDD and spondylosis. LBP is likely spine mediated. Right leg pain may be spine mediated. Ortho did not feel her pain was hip mediated.   Treatment options discussed with patient and following plan made:   - She is having no current pain, so will hold on further treatment for now.  - If pain gets worse, consider  PT and/or lumbar MRI.  - Will call her in 4 weeks to check on her.   I spent a total of 25 minutes in face-to-face and non-face-to-face activities related to this patient's care today including review of outside records, review of imaging, review of symptoms, physical exam, discussion of differential diagnosis, discussion of treatment options, and documentation.   Thank you for involving me in the care of this patient.   Geronimo Boot PA-C Dept. of Neurosurgery

## 2022-07-11 ENCOUNTER — Telehealth: Payer: Self-pay | Admitting: Orthopedic Surgery

## 2022-07-11 NOTE — Telephone Encounter (Signed)
Her back pain is getting better, not much pain any more. She is aware to call the office if she has any more issues with her back. We can consider a referral to PT and then an MRI, she agreed.

## 2022-07-11 NOTE — Telephone Encounter (Signed)
See below. Please call to see how she is doing.   Thanks.

## 2022-07-11 NOTE — Telephone Encounter (Signed)
-----   Message from Geronimo Boot, [place] sent at 07/10/2022  4:10 PM EST -----  ----- Message ----- From: Geronimo Boot, PA-C Sent: 07/10/2022  12:00 AM EST To: Geronimo Boot, PA-C  Call to check on her.   If pain is back/worse, consider PT for lumbar spine and/or lumbar MRI.

## 2022-10-04 ENCOUNTER — Encounter: Payer: Self-pay | Admitting: *Deleted

## 2022-10-05 ENCOUNTER — Encounter: Payer: Self-pay | Admitting: *Deleted

## 2022-10-05 ENCOUNTER — Ambulatory Visit: Payer: 59 | Admitting: Anesthesiology

## 2022-10-05 ENCOUNTER — Encounter
Admission: RE | Disposition: A | Discharge status: Home / Self Care | Payer: Self-pay | Source: Home / Self Care | Attending: Gastroenterology

## 2022-10-05 ENCOUNTER — Ambulatory Visit
Admission: RE | Admit: 2022-10-05 | Discharge: 2022-10-05 | Disposition: A | Discharge status: Home / Self Care | Payer: 59 | Attending: Gastroenterology | Admitting: Gastroenterology

## 2022-10-05 DIAGNOSIS — N183 Chronic kidney disease, stage 3 unspecified: Secondary | ICD-10-CM | POA: Diagnosis not present

## 2022-10-05 DIAGNOSIS — Z96641 Presence of right artificial hip joint: Secondary | ICD-10-CM | POA: Insufficient documentation

## 2022-10-05 DIAGNOSIS — J4489 Other specified chronic obstructive pulmonary disease: Secondary | ICD-10-CM | POA: Insufficient documentation

## 2022-10-05 DIAGNOSIS — I272 Pulmonary hypertension, unspecified: Secondary | ICD-10-CM | POA: Diagnosis not present

## 2022-10-05 DIAGNOSIS — K2100 Gastro-esophageal reflux disease with esophagitis, without bleeding: Secondary | ICD-10-CM | POA: Insufficient documentation

## 2022-10-05 DIAGNOSIS — K317 Polyp of stomach and duodenum: Secondary | ICD-10-CM | POA: Insufficient documentation

## 2022-10-05 DIAGNOSIS — K640 First degree hemorrhoids: Secondary | ICD-10-CM | POA: Diagnosis not present

## 2022-10-05 DIAGNOSIS — I130 Hypertensive heart and chronic kidney disease with heart failure and stage 1 through stage 4 chronic kidney disease, or unspecified chronic kidney disease: Secondary | ICD-10-CM | POA: Insufficient documentation

## 2022-10-05 DIAGNOSIS — K449 Diaphragmatic hernia without obstruction or gangrene: Secondary | ICD-10-CM | POA: Insufficient documentation

## 2022-10-05 DIAGNOSIS — K219 Gastro-esophageal reflux disease without esophagitis: Secondary | ICD-10-CM | POA: Diagnosis present

## 2022-10-05 DIAGNOSIS — I509 Heart failure, unspecified: Secondary | ICD-10-CM | POA: Insufficient documentation

## 2022-10-05 DIAGNOSIS — Z9049 Acquired absence of other specified parts of digestive tract: Secondary | ICD-10-CM | POA: Insufficient documentation

## 2022-10-05 DIAGNOSIS — F32A Depression, unspecified: Secondary | ICD-10-CM | POA: Diagnosis not present

## 2022-10-05 DIAGNOSIS — F419 Anxiety disorder, unspecified: Secondary | ICD-10-CM | POA: Insufficient documentation

## 2022-10-05 DIAGNOSIS — D122 Benign neoplasm of ascending colon: Secondary | ICD-10-CM | POA: Diagnosis not present

## 2022-10-05 HISTORY — PX: COLONOSCOPY WITH PROPOFOL: SHX5780

## 2022-10-05 HISTORY — PX: ESOPHAGOGASTRODUODENOSCOPY (EGD) WITH PROPOFOL: SHX5813

## 2022-10-05 HISTORY — DX: Malignant neoplasm of endometrium: C54.1

## 2022-10-05 SURGERY — COLONOSCOPY WITH PROPOFOL
Anesthesia: General

## 2022-10-05 MED ORDER — LIDOCAINE HCL (PF) 2 % IJ SOLN
INTRAMUSCULAR | Status: AC
  Filled 2022-10-05: qty 5

## 2022-10-05 MED ORDER — PROPOFOL 500 MG/50ML IV EMUL
INTRAVENOUS | Status: DC | PRN
  Administered 2022-10-05: 75 ug/kg/min via INTRAVENOUS

## 2022-10-05 MED ORDER — SODIUM CHLORIDE 0.9 % IV SOLN: INTRAVENOUS | Status: DC

## 2022-10-05 MED ORDER — PROPOFOL 10 MG/ML IV BOLUS
INTRAVENOUS | Status: DC | PRN
  Administered 2022-10-05: 20 mg via INTRAVENOUS
  Administered 2022-10-05: 70 mg via INTRAVENOUS
  Administered 2022-10-05: 30 mg via INTRAVENOUS

## 2022-10-05 MED ORDER — LIDOCAINE HCL (CARDIAC) PF 100 MG/5ML IV SOSY
PREFILLED_SYRINGE | INTRAVENOUS | Status: DC | PRN
  Administered 2022-10-05: 50 mg via INTRAVENOUS

## 2022-10-05 MED ORDER — GLYCOPYRROLATE 0.2 MG/ML IJ SOLN
INTRAMUSCULAR | Status: AC
  Filled 2022-10-05: qty 1

## 2022-10-05 MED ORDER — GLYCOPYRROLATE 0.2 MG/ML IJ SOLN
INTRAMUSCULAR | Status: DC | PRN
  Administered 2022-10-05: .1 mg via INTRAVENOUS

## 2022-10-05 NOTE — Interval H&P Note (Signed)
History and Physical Interval Note:  10/05/2022 1:32 PM  Anna Romero  has presented today for surgery, with the diagnosis of GERD,HX OF ADENOMATOUS POLYP OF COLON.  The various methods of treatment have been discussed with the patient and family. After consideration of risks, benefits and other options for treatment, the patient has consented to  Procedure(s) with comments: COLONOSCOPY WITH PROPOFOL (N/A) - PREFERS PM ESOPHAGOGASTRODUODENOSCOPY (EGD) WITH PROPOFOL (N/A) as a surgical intervention.  The patient's history has been reviewed, patient examined, no change in status, stable for surgery.  I have reviewed the patient's chart and labs.  Questions were answered to the patient's satisfaction.     Regis Bill  Ok to proceed with colonoscopy

## 2022-10-05 NOTE — Anesthesia Preprocedure Evaluation (Signed)
Anesthesia Evaluation  Patient identified by MRN, date of birth, ID band Patient awake    Reviewed: Allergy & Precautions, H&P , NPO status , Patient's Chart, lab work & pertinent test results  History of Anesthesia Complications Negative for: history of anesthetic complications  Airway Mallampati: II  TM Distance: >3 FB     Dental  (+) Teeth Intact, Dental Advidsory Given   Pulmonary neg shortness of breath, asthma , sleep apnea , COPD, neg recent URI Pulmonary HTN No home oxygen   breath sounds clear to auscultation       Cardiovascular Exercise Tolerance: Poor hypertension, pulmonary hypertension(-) angina +CHF  (-) Past MI and (-) Cardiac Stents (-) dysrhythmias  Rhythm:regular Rate:Normal  Negative stress echo Feb 2021 Echo:  MILD LV SYSTOLIC DYSFUNCTION WITH MILD LVH NORMAL RIGHT VENTRICULAR SYSTOLIC FUNCTION MILD VALVULAR REGURGITATION  NO VALVULAR STENOSIS MILD MR, TR ,PR EF40-45%   Neuro/Psych  PSYCHIATRIC DISORDERS Anxiety Depression    negative neurological ROS     GI/Hepatic Neg liver ROS,GERD  ,,  Endo/Other  negative endocrine ROS    Renal/GU Renal disease ([facility])     Musculoskeletal   Abdominal   Peds  Hematology negative hematology ROS (+)   Anesthesia Other Findings Past Medical History: No date: Anxiety No date: CHF (congestive heart failure) (HCC) No date: Chronic kidney disease     Comment:  Renal Insufficiency Stage 3 No date: Degenerative disc disease, cervical No date: Depression No date: GERD (gastroesophageal reflux disease) No date: Hyperlipidemia No date: Hypertension No date: Mixed restrictive and obstructive lung disease (HCC)     Comment:  Predominantly restriction due to chronic volume loss in               the left lung.  Mild small airways disease. No date: Moderate mitral insufficiency No date: Monoclonal gammopathy No date: Polymyositis (HCC) No date: Pulmonary  hypertension (HCC)  Past Surgical History: No date: APPENDECTOMY 1990: BREAST EXCISIONAL BIOPSY; Left     Comment:  negative No date: CATARACT EXTRACTION 08/27/2016: COLONOSCOPY; N/A     Comment:  Procedure: COLONOSCOPY;  Surgeon: Christena Deem, MD;              Location: [facility] ENDOSCOPY;  Service: Endoscopy;                Laterality: N/A; 07/22/2018: COLONOSCOPY WITH PROPOFOL; N/A     Comment:  Procedure: COLONOSCOPY WITH PROPOFOL;  Surgeon:               Christena Deem, MD;  Location: [facility] ENDOSCOPY;                Service: Endoscopy;  Laterality: N/A; No date: JOINT REPLACEMENT No date: REPLACEMENT TOTAL HIP W/  RESURFACING IMPLANTS; Right No date: THYMECTOMY     Reproductive/Obstetrics negative OB ROS                             Anesthesia Physical Anesthesia Plan  ASA: 3  Anesthesia Plan: General   Post-op Pain Management:    Induction: Intravenous  PONV Risk Score and Plan: Propofol infusion and TIVA  Airway Management Planned: Natural Airway and Nasal Cannula  Additional Equipment:   Intra-op Plan:   Post-operative Plan:   Informed Consent: I have reviewed the patients History and Physical, chart, labs and discussed the procedure including the risks, benefits and alternatives for the proposed anesthesia with the patient or authorized representative who  has indicated his/her understanding and acceptance.     Dental Advisory Given  Plan Discussed with: Anesthesiologist, CRNA and Surgeon  Anesthesia Plan Comments:         Anesthesia Quick Evaluation

## 2022-10-05 NOTE — Op Note (Signed)
[facility] Gastroenterology Patient Name: Anna Romero Procedure Date: 10/05/2022 1:23 PM MRN: [id number] Account #: [id number] Date of Birth: [birth year] Admit Type: Outpatient Age: 72 Room: [facility] ENDO ROOM 3 Gender: Female Note Status: Finalized Instrument Name: Prentice Docker [id number] Procedure:             Colonoscopy Indications:           Surveillance: Personal history of adenomatous polyps                         on last colonoscopy 3 years ago Providers:             Eather Colas MD, MD Medicines:             Monitored Anesthesia Care Complications:         No immediate complications. Estimated blood loss:                         Minimal. Procedure:             Pre-Anesthesia Assessment:                        - Prior to the procedure, a History and Physical was                         performed, and patient medications and allergies were                         reviewed. The patient is competent. The risks and                         benefits of the procedure and the sedation options and                         risks were discussed with the patient. All questions                         were answered and informed consent was obtained.                         Patient identification and proposed procedure were                         verified by the physician, the nurse, the                         anesthesiologist, the anesthetist and the technician                         in the endoscopy suite. Mental Status Examination:                         alert and oriented. Airway Examination: normal                         oropharyngeal airway and neck mobility. Respiratory                         Examination: clear to auscultation. CV Examination:  normal. Prophylactic Antibiotics: The patient does not                         require prophylactic antibiotics. Prior                         Anticoagulants: The patient has taken no  anticoagulant                         or antiplatelet agents. ASA Grade Assessment: III - A                         patient with severe systemic disease. After reviewing                         the risks and benefits, the patient was deemed in                         satisfactory condition to undergo the procedure. The                         anesthesia plan was to use monitored anesthesia care                         (MAC). Immediately prior to administration of                         medications, the patient was re-assessed for adequacy                         to receive sedatives. The heart rate, respiratory                         rate, oxygen saturations, blood pressure, adequacy of                         pulmonary ventilation, and response to care were                         monitored throughout the procedure. The physical                         status of the patient was re-assessed after the                         procedure.                        After obtaining informed consent, the colonoscope was                         passed under direct vision. Throughout the procedure,                         the patient's blood pressure, pulse, and oxygen                         saturations were monitored continuously. The  Colonoscope was introduced through the anus and                         advanced to the the cecum, identified by appendiceal                         orifice and ileocecal valve. The colonoscopy was                         performed without difficulty. The patient tolerated                         the procedure well. The quality of the bowel                         preparation was excellent. The ileocecal valve,                         appendiceal orifice, and rectum were photographed. Findings:      The perianal and digital rectal examinations were normal.      A tattoo was seen in the ascending colon. The tattoo site appeared       abnormal.  There appeared to be residual polyp from previous polypectomy.      A 4 mm polyp was found in the ascending colon at the tattoo site. The       polyp was sessile. The polyp was removed with a cold snare. Resection       and retrieval were complete. Estimated blood loss was minimal.      Internal hemorrhoids were found during retroflexion. The hemorrhoids       were Grade I (internal hemorrhoids that do not prolapse).      The exam was otherwise without abnormality on direct and retroflexion       views. Impression:            - A tattoo was seen in the ascending colon. The tattoo                         site appeared abnormal.                        - One 4 mm polyp in the ascending colon, removed with                         a cold snare. Resected and retrieved.                        - Internal hemorrhoids.                        - The examination was otherwise normal on direct and                         retroflexion views. Recommendation:        - Discharge patient to home.                        - Resume previous diet.                        -  Continue present medications.                        - Await pathology results.                        - Repeat colonoscopy for surveillance based on                         pathology results.                        - Return to referring physician as previously                         scheduled. Procedure Code(s):     --- Professional ---                        [phone], Colonoscopy, flexible; with removal of                         tumor(s), polyp(s), or other lesion(s) by snare                         technique Diagnosis Code(s):     --- Professional ---                        Z86.010, Personal history of colonic polyps                        K64.0, First degree hemorrhoids                        D12.2, Benign neoplasm of ascending colon CPT copyright 2022 [employer]. All rights reserved. The codes documented in this report  are preliminary and upon coder review may  be revised to meet current compliance requirements. Eather Colas MD, MD 10/05/2022 2:10:30 PM Number of Addenda: 0 Note Initiated On: 10/05/2022 1:23 PM Scope Withdrawal Time: 0 hours 8 minutes 12 seconds  Total Procedure Duration: 0 hours 11 minutes 5 seconds  Estimated Blood Loss:  Estimated blood loss was minimal.      [facility]

## 2022-10-05 NOTE — Op Note (Signed)
[facility] Gastroenterology Patient Name: Anna Romero Procedure Date: 10/05/2022 1:23 PM MRN: [id number] Account #: [id number] Date of Birth: 04/06/1951 Admit Type: Outpatient Age: 72 Room: [facility] ENDO ROOM 3 Gender: Female Note Status: Finalized Instrument Name: Patton Salles Endoscope [id number] Procedure:             Upper GI endoscopy Indications:           Gastro-esophageal reflux disease Providers:             Eather Colas MD, MD Medicines:             Monitored Anesthesia Care Complications:         No immediate complications. Estimated blood loss:                         Minimal. Procedure:             Pre-Anesthesia Assessment:                        - Prior to the procedure, a History and Physical was                         performed, and patient medications and allergies were                         reviewed. The patient is competent. The risks and                         benefits of the procedure and the sedation options and                         risks were discussed with the patient. All questions                         were answered and informed consent was obtained.                         Patient identification and proposed procedure were                         verified by the physician, the nurse, the                         anesthesiologist, the anesthetist and the technician                         in the endoscopy suite. Mental Status Examination:                         alert and oriented. Airway Examination: normal                         oropharyngeal airway and neck mobility. Respiratory                         Examination: clear to auscultation. CV Examination:                         normal. Prophylactic Antibiotics: The patient does not  require prophylactic antibiotics. Prior                         Anticoagulants: The patient has taken no anticoagulant                         or antiplatelet agents. ASA Grade  Assessment: III - A                         patient with severe systemic disease. After reviewing                         the risks and benefits, the patient was deemed in                         satisfactory condition to undergo the procedure. The                         anesthesia plan was to use monitored anesthesia care                         (MAC). Immediately prior to administration of                         medications, the patient was re-assessed for adequacy                         to receive sedatives. The heart rate, respiratory                         rate, oxygen saturations, blood pressure, adequacy of                         pulmonary ventilation, and response to care were                         monitored throughout the procedure. The physical                         status of the patient was re-assessed after the                         procedure.                        After obtaining informed consent, the endoscope was                         passed under direct vision. Throughout the procedure,                         the patient's blood pressure, pulse, and oxygen                         saturations were monitored continuously. The Endoscope                         was introduced through the mouth, and advanced to the  second part of duodenum. The upper GI endoscopy was                         accomplished without difficulty. The patient tolerated                         the procedure well. Findings:      LA Grade B (one or more mucosal breaks greater than 5 mm, not extending       between the tops of two mucosal folds) esophagitis with no bleeding was       found.      Multiple tongues of salmon-colored mucosa were present. No other visible       abnormalities were present. The maximum longitudinal extent of these       esophageal mucosal changes was 1 cm in length. Biopsies were taken with       a cold forceps for histology. Estimated blood  loss was minimal.      A small hiatal hernia was present.      A few small sessile fundic gland polyps with no bleeding and no stigmata       of recent bleeding were found in the gastric body.      The exam of the stomach was otherwise normal.      The examined duodenum was normal. Impression:            - LA Grade B reflux esophagitis with no bleeding.                        - Salmon-colored mucosa suspicious for short-segment                         Barrett's esophagus. Biopsied.                        - Small hiatal hernia.                        - A few fundic gland polyps.                        - Normal examined duodenum. Recommendation:        - Discharge patient to home.                        - Resume previous diet.                        - Continue present medications.                        - Await pathology results.                        - Return to referring physician as previously                         scheduled. Procedure Code(s):     --- Professional ---                        [id number], Esophagogastroduodenoscopy, flexible,  transoral; with biopsy, single or multiple Diagnosis Code(s):     --- Professional ---                        K21.00, Gastro-esophageal reflux disease with                         esophagitis, without bleeding                        K22.89, Other specified disease of esophagus                        K44.9, Diaphragmatic hernia without obstruction or                         gangrene                        K31.7, Polyp of stomach and duodenum CPT copyright 2022 American Medical Association. All rights reserved. The codes documented in this report are preliminary and upon coder review may  be revised to meet current compliance requirements. Eather Colas MD, MD 10/05/2022 2:06:04 PM Number of Addenda: 0 Note Initiated On: 10/05/2022 1:23 PM Estimated Blood Loss:  Estimated blood loss was minimal.      [facility]

## 2022-10-05 NOTE — H&P (Signed)
Outpatient short stay form Pre-procedure 10/05/2022  Anna Bill, MD  Primary Physician: Enid Baas, MD  Reason for visit:  GERD/History of polyps  History of present illness:    72 y/o lady with history of hypertension, arthritis, and asthma here for EGD for GERD and colonoscopy for history of advanced adenomas. Last colonoscopy in 2020 with TVA's. No blood thinners. No family history of GI malignancies. History of appendectomy and hysterectomy.    Current Facility-Administered Medications:    0.9 %  sodium chloride infusion, , Intravenous, Continuous, Lowry Bala, Rossie Muskrat, MD, Last Rate: 20 mL/hr at 10/05/22 1329, Continued from Pre-op at 10/05/22 1329  Facility-Administered Medications Ordered in Other Encounters:    glycopyrrolate (ROBINUL) injection, , Intravenous, Anesthesia Intra-op, Jeannene Patella, CRNA, 0.1 mg at 10/05/22 1329  Medications Prior to Admission  Medication Sig Dispense Refill Last Dose   carvedilol (COREG) 6.25 MG tablet Take 6.25 mg by mouth 2 (two) times daily.   10/05/2022   clonazePAM (KLONOPIN) 0.5 MG tablet Take 0.5 mg by mouth 2 (two) times daily as needed for anxiety.   Past Month   furosemide (LASIX) 20 MG tablet Take 20 mg by mouth daily as needed for fluid. Takes every few days   Past Week   memantine (NAMENDA) 10 MG tablet Take 5 mg by mouth 2 (two) times daily.   Past Week   spironolactone (ALDACTONE) 25 MG tablet Take 25 mg by mouth daily.   10/04/2022   telmisartan (MICARDIS) 40 MG tablet Take 40 mg by mouth daily.   10/04/2022   acetaminophen (TYLENOL) 500 MG tablet Take 1,000 mg by mouth every 8 (eight) hours as needed for moderate pain. Needs daily      buPROPion (WELLBUTRIN SR) 200 MG 12 hr tablet Take 200 mg by mouth every morning.       Fluticasone-Umeclidin-Vilant (TRELEGY ELLIPTA) 100-62.5-25 MCG/ACT AEPB Inhale 100 mcg into the lungs daily. 28 each 0    Multiple Vitamins-Minerals (MULTIVITAMIN WITH MINERALS) tablet Take 1 tablet  by mouth daily.      Suvorexant (BELSOMRA PO) Take 10 mg by mouth every evening. Takes a few nights per week        Allergies  Allergen Reactions   Hctz [Hydrochlorothiazide] Other (See Comments)    Kidney Disorder     Past Medical History:  Diagnosis Date   Anxiety    Asthma    CHF (congestive heart failure) (HCC)    Chronic kidney disease    Renal Insufficiency Stage 3   Degenerative disc disease, cervical    Depression    Endometrial cancer (HCC)    GERD (gastroesophageal reflux disease)    Hyperlipidemia    Hypertension    Mixed restrictive and obstructive lung disease (HCC)    Predominantly restriction due to chronic volume loss in the left lung.  Mild small airways disease.   Moderate mitral insufficiency    Monoclonal gammopathy    Polymyositis (HCC)    Pulmonary hypertension (HCC)     Review of systems:  Otherwise negative.    Physical Exam  Gen: Alert, oriented. Appears stated age.  HEENT: PERRLA. Lungs: No respiratory distress CV: RRR Abd: soft, benign, no masses Ext: No edema    Planned procedures: Proceed with colonoscopy. The patient understands the nature of the planned procedure, indications, risks, alternatives and potential complications including but not limited to bleeding, infection, perforation, damage to internal organs and possible oversedation/side effects from anesthesia. The patient agrees and gives consent to proceed.  Please refer to procedure notes for findings, recommendations and patient disposition/instructions.     Anna Bill, MD [facility] Gastroenterology

## 2022-10-05 NOTE — Transfer of Care (Signed)
Immediate Anesthesia Transfer of Care Note  Patient: Anna Romero  Procedure(s) Performed: COLONOSCOPY WITH PROPOFOL ESOPHAGOGASTRODUODENOSCOPY (EGD) WITH PROPOFOL  Patient Location: PACU and Endoscopy Unit  Anesthesia Type:General  Level of Consciousness: drowsy, patient cooperative, and responds to stimulation  Airway & Oxygen Therapy: Patient Spontanous Breathing  Post-op Assessment: Report given to RN and Post -op Vital signs reviewed and stable  Post vital signs: Reviewed and stable  Last Vitals:  Vitals Value Taken Time  BP 117/61   Temp    Pulse 51 10/05/22 1405  Resp 17 10/05/22 1405  SpO2 100 % 10/05/22 1405  Vitals shown include unvalidated device data.  Last Pain:  Vitals:   10/05/22 1236  TempSrc: Temporal  PainSc: Asleep         Complications: No notable events documented.

## 2022-10-05 NOTE — Anesthesia Postprocedure Evaluation (Signed)
Anesthesia Post Note  Patient: ADYLAN MOAD  Procedure(s) Performed: COLONOSCOPY WITH PROPOFOL ESOPHAGOGASTRODUODENOSCOPY (EGD) WITH PROPOFOL  Patient location during evaluation: Endoscopy Anesthesia Type: General Level of consciousness: awake and alert Pain management: pain level controlled Vital Signs Assessment: post-procedure vital signs reviewed and stable Respiratory status: spontaneous breathing, nonlabored ventilation, respiratory function stable and patient connected to nasal cannula oxygen Cardiovascular status: blood pressure returned to baseline and stable Postop Assessment: no apparent nausea or vomiting Anesthetic complications: no   No notable events documented.   Last Vitals:  Vitals:   10/05/22 1236 10/05/22 1404  BP: (!) 159/80 117/61  Pulse: 67 (!) 50  Resp: 18 20  Temp: (!) 36.1 C 36.8 C  SpO2: 100% 100%    Last Pain:  Vitals:   10/05/22 1424  TempSrc:   PainSc: 0-No pain                 Lenard Simmer

## 2022-10-08 ENCOUNTER — Encounter: Payer: Self-pay | Admitting: Gastroenterology

## 2022-10-10 LAB — SURGICAL PATHOLOGY

## 2022-11-01 ENCOUNTER — Ambulatory Visit: Payer: 59 | Attending: Neurology | Admitting: Speech Pathology

## 2022-11-01 DIAGNOSIS — R41841 Cognitive communication deficit: Secondary | ICD-10-CM | POA: Diagnosis present
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
## 2022-11-06 ENCOUNTER — Ambulatory Visit
Admission: RE | Admit: 2022-11-06 | Discharge: 2022-11-06 | Disposition: A | Discharge status: Home / Self Care | Payer: 59 | Source: Ambulatory Visit | Attending: Internal Medicine | Admitting: Internal Medicine

## 2022-11-06 ENCOUNTER — Ambulatory Visit: Payer: 59 | Admitting: Speech Pathology

## 2022-11-06 DIAGNOSIS — Z1231 Encounter for screening mammogram for malignant neoplasm of breast: Secondary | ICD-10-CM | POA: Insufficient documentation

## 2022-11-06 DIAGNOSIS — R41841 Cognitive communication deficit: Secondary | ICD-10-CM
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
## 2022-11-08 ENCOUNTER — Ambulatory Visit: Payer: 59 | Admitting: Speech Pathology

## 2022-11-12 ENCOUNTER — Ambulatory Visit: Payer: 59 | Admitting: Speech Pathology

## 2022-11-12 ENCOUNTER — Telehealth: Payer: Self-pay

## 2022-11-12 NOTE — Telephone Encounter (Signed)
Patient did not show for her SLP appointment scheduled for 11/12/22 @ 1100. This Clinical research associate attempted to contact patient via telephone. Pt did not answer and no voicemail set up for pt's only provided telephone number. Patient's next SLP appointment is scheduled for 11/27/22 @ 1400. If patient no shows for the next appointment, we will need to cancel subsequent appointments per policy.

## 2022-11-15 ENCOUNTER — Ambulatory Visit: Payer: 59 | Admitting: Speech Pathology

## 2022-11-21 ENCOUNTER — Inpatient Hospital Stay: Payer: 59

## 2022-11-27 ENCOUNTER — Ambulatory Visit: Payer: 59 | Admitting: Speech Pathology

## 2022-11-29 ENCOUNTER — Encounter: Payer: 59 | Admitting: Speech Pathology

## 2022-12-04 ENCOUNTER — Ambulatory Visit: Payer: 59 | Attending: Neurology | Admitting: Speech Pathology

## 2022-12-04 DIAGNOSIS — R41841 Cognitive communication deficit: Secondary | ICD-10-CM | POA: Insufficient documentation
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
## 2022-12-10 ENCOUNTER — Telehealth: Payer: Self-pay

## 2022-12-10 ENCOUNTER — Ambulatory Visit: Payer: 59 | Admitting: Speech Pathology

## 2022-12-10 NOTE — Telephone Encounter (Signed)
Pt did not show for scheduled ST treatment session. Contacted pt via telephone. Pt apologized for missing appointment today, stating that she "though it was at 2 o'clock." Pt commented that she has "a lot going on at the house" including "septic issues" which has been "stressful." Confirmed next scheduled appointment is for Monday, 7/29, @ 1400. Pt stated she would plan to attend.

## 2022-12-12 ENCOUNTER — Inpatient Hospital Stay: Payer: 59

## 2022-12-13 ENCOUNTER — Encounter: Payer: 59 | Admitting: Speech Pathology

## 2022-12-17 ENCOUNTER — Ambulatory Visit: Payer: 59 | Admitting: Speech Pathology

## 2022-12-19 ENCOUNTER — Ambulatory Visit: Payer: 59

## 2022-12-20 ENCOUNTER — Telehealth: Payer: Self-pay

## 2022-12-20 ENCOUNTER — Ambulatory Visit: Payer: 59 | Attending: Neurology | Admitting: Speech Pathology

## 2022-12-20 DIAGNOSIS — R41841 Cognitive communication deficit: Secondary | ICD-10-CM | POA: Insufficient documentation

## 2022-12-20 NOTE — Telephone Encounter (Signed)
Pt "no showed" for today's ST treatment. Called pt's cell phone number. Voicemail is not currently set up. Will keep pt's next scheduled appointment. If pt "no shows" again, pt will be d/c'd from ST services per policy.

## 2022-12-24 ENCOUNTER — Ambulatory Visit: Payer: 59 | Admitting: Speech Pathology

## 2022-12-24 DIAGNOSIS — R41841 Cognitive communication deficit: Secondary | ICD-10-CM
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
## 2022-12-31 ENCOUNTER — Ambulatory Visit: Payer: 59 | Admitting: Speech Pathology

## 2023-01-02 ENCOUNTER — Inpatient Hospital Stay: Payer: 59 | Attending: Obstetrics and Gynecology | Admitting: Obstetrics and Gynecology

## 2023-01-02 VITALS — BP 117/69 | HR 58 | Temp 98.9°F | Resp 19 | Wt 189.3 lb

## 2023-01-02 DIAGNOSIS — N952 Postmenopausal atrophic vaginitis: Secondary | ICD-10-CM | POA: Insufficient documentation

## 2023-01-02 DIAGNOSIS — N941 Unspecified dyspareunia: Secondary | ICD-10-CM | POA: Diagnosis not present

## 2023-01-02 DIAGNOSIS — Z90710 Acquired absence of both cervix and uterus: Secondary | ICD-10-CM | POA: Insufficient documentation

## 2023-01-02 DIAGNOSIS — Z8542 Personal history of malignant neoplasm of other parts of uterus: Secondary | ICD-10-CM | POA: Diagnosis present

## 2023-01-02 DIAGNOSIS — C541 Malignant neoplasm of endometrium: Secondary | ICD-10-CM

## 2023-01-02 DIAGNOSIS — Z90722 Acquired absence of ovaries, bilateral: Secondary | ICD-10-CM | POA: Diagnosis not present
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
## 2023-01-03 ENCOUNTER — Ambulatory Visit: Payer: 59 | Admitting: Speech Pathology

## 2023-01-03 DIAGNOSIS — R41841 Cognitive communication deficit: Secondary | ICD-10-CM
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
## 2023-01-10 ENCOUNTER — Ambulatory Visit: Payer: 59

## 2023-01-10 DIAGNOSIS — R41841 Cognitive communication deficit: Secondary | ICD-10-CM
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
## 2023-01-16 ENCOUNTER — Encounter: Payer: 59 | Admitting: Speech Pathology

## 2023-01-21 IMAGING — CT CT HEAD W/O CM
3 series · 16 of 47 positions shown, 19 images · non-contrast
Comparison: CT head 03/02/2012

CLINICAL DATA: Acute neuro deficit

EXAM:
CT HEAD WITHOUT CONTRAST
TECHNIQUE: Contiguous axial images were obtained from the base of the skull
through the vertex without intravenous contrast.

[Series 2: head wo · axial · 0.42mm/px · z∈[-83,+42]mm · 10 of 31 slices shown, 13 images]
[im 3/31  brain]
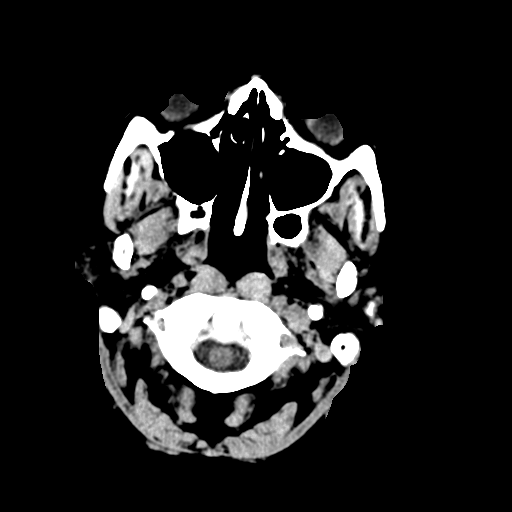
[im 3/31  bone]
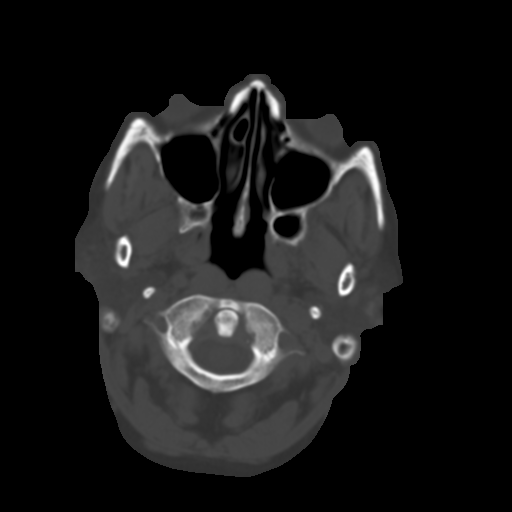
[im 6/31  brain]
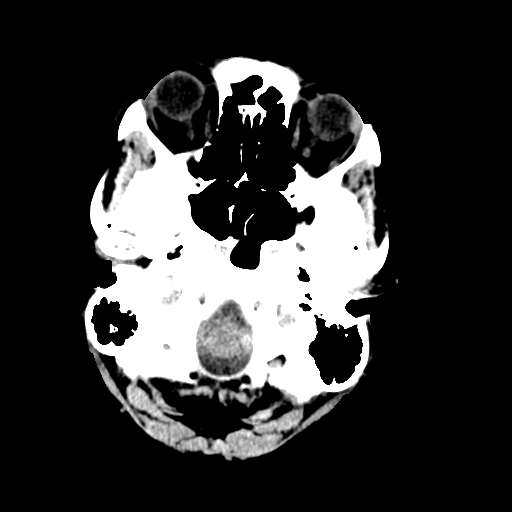
[im 9/31  brain]
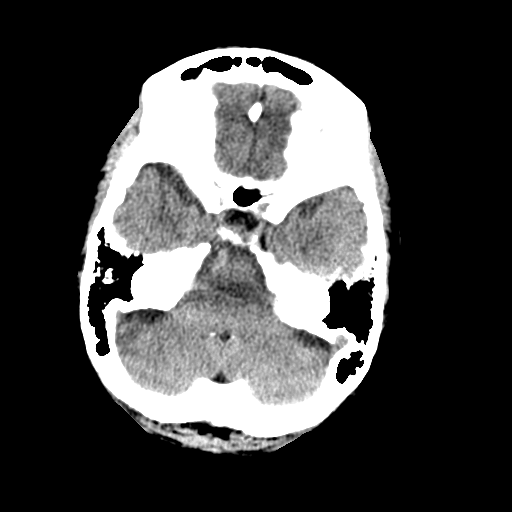
[im 11/31  brain]
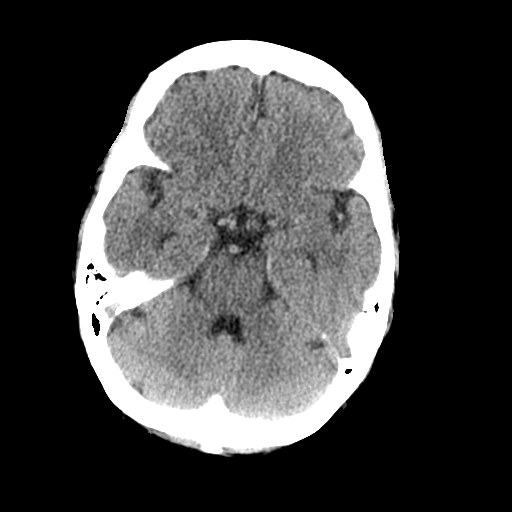
[im 14/31  brain]
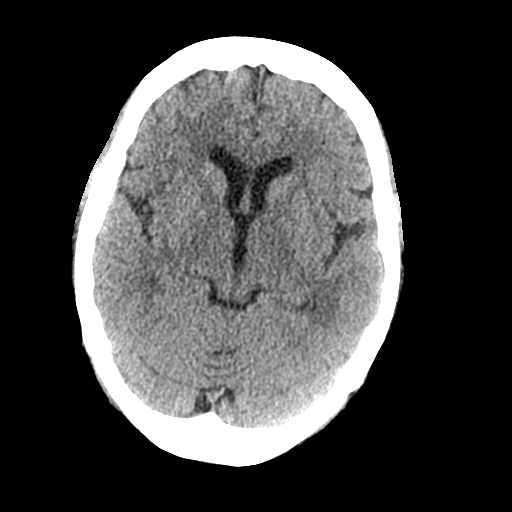
[im 14/31  bone]
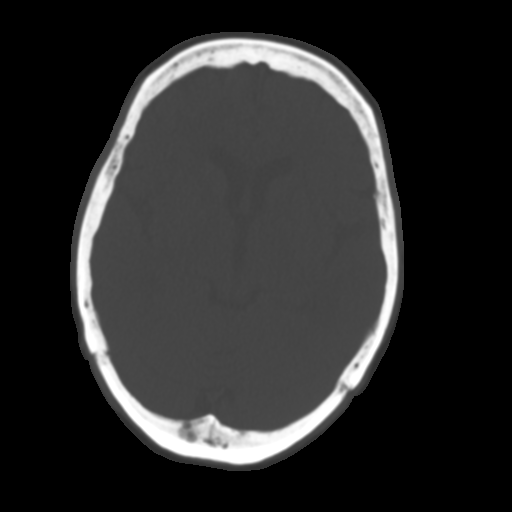
[im 17/31  brain]
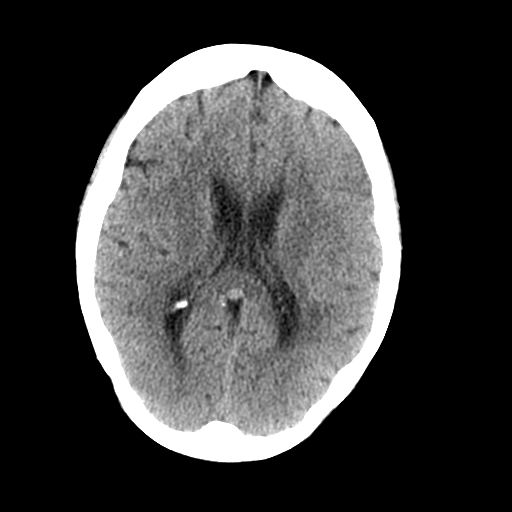
[im 20/31  brain]
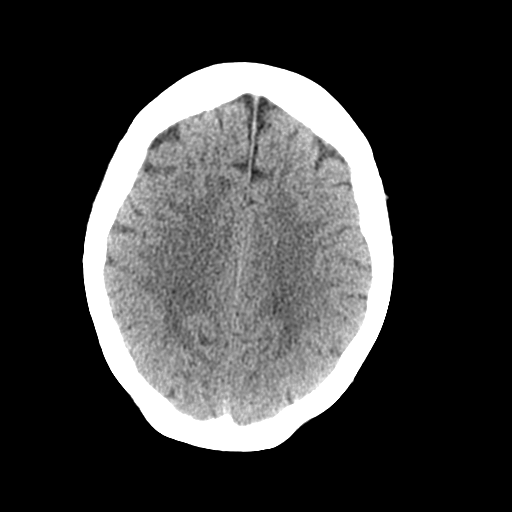
[im 23/31  brain]
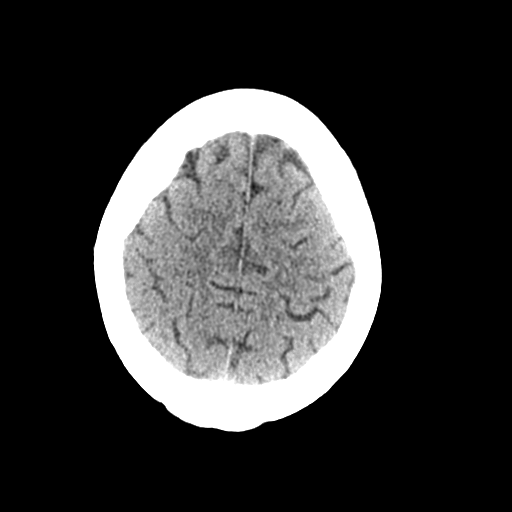
[im 25/31  brain]
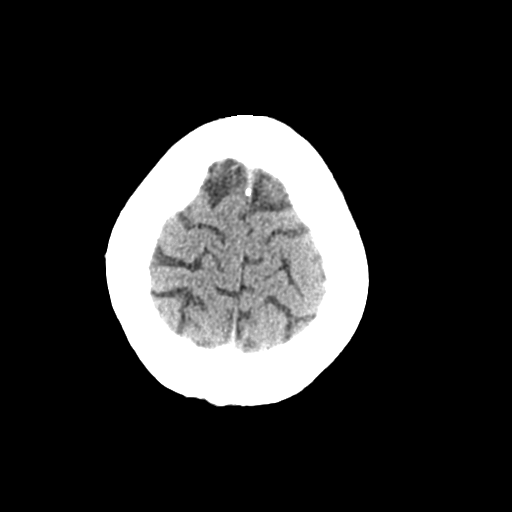
[im 25/31  bone]
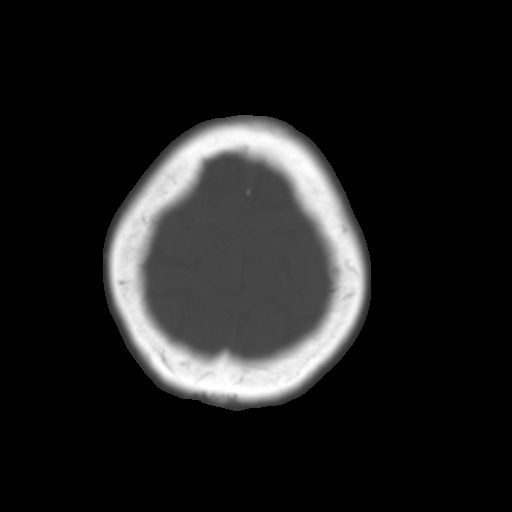
[im 28/31  brain]
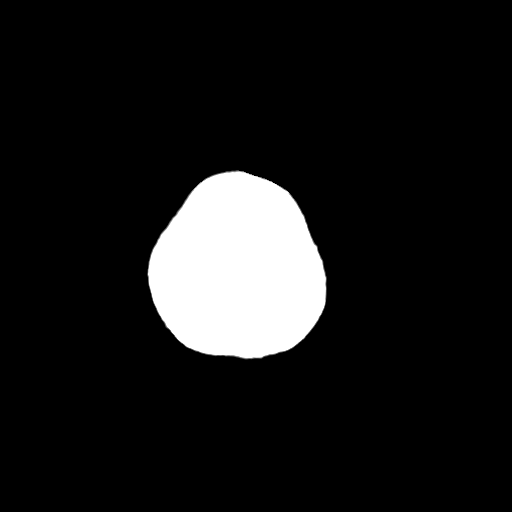

[Series 4: coronal soft tissue · coronal · 0.30mm/px · 3 of 63 slices shown]
[im 21/63  brain]
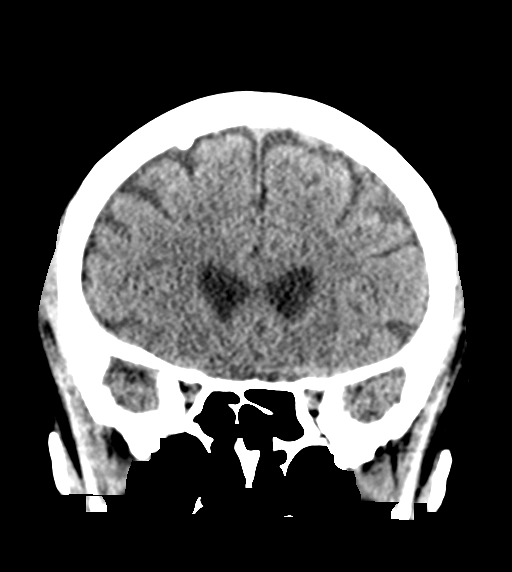
[im 28/63  brain]
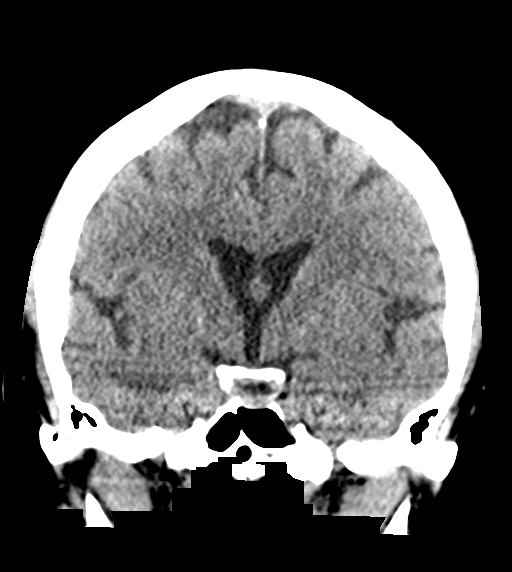
[im 35/63  brain]
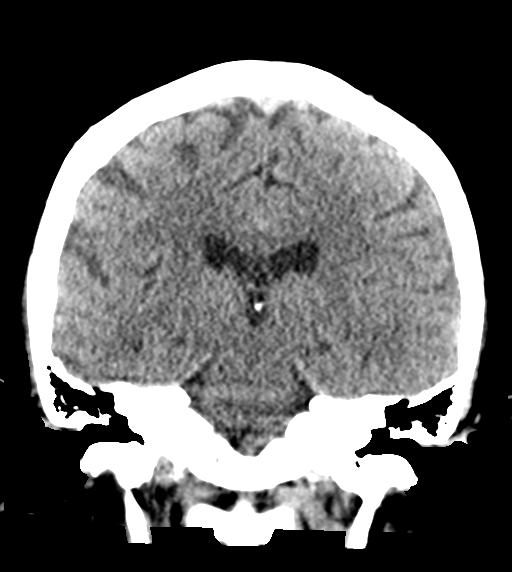

[Series 5: sagittal soft tissue · sagittal · 0.33mm/px · 3 of 51 slices shown]
[im 17/51  brain]
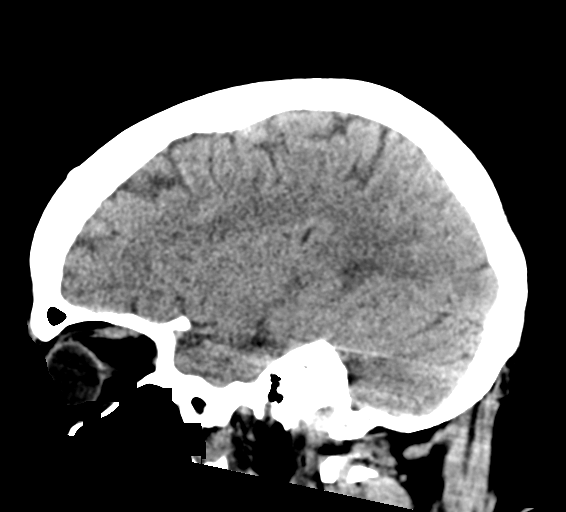
[im 26/51  brain]
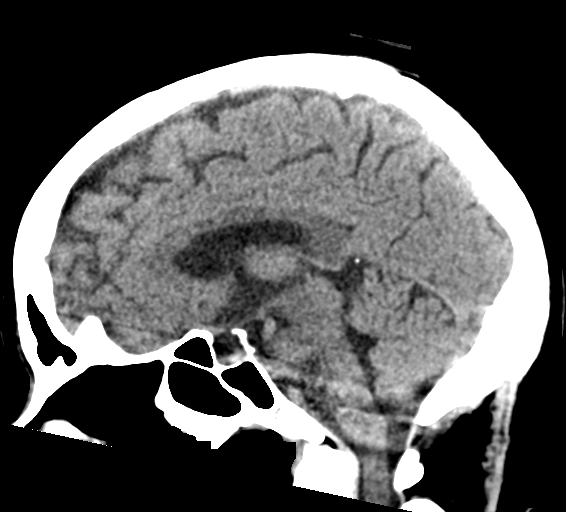
[im 34/51  brain]
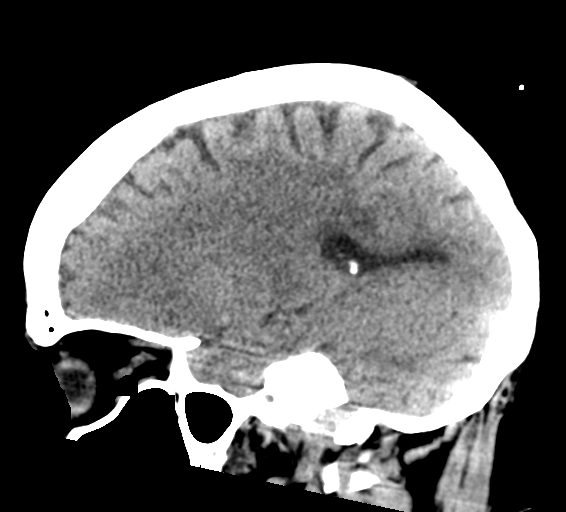

[16 of 47 positions shown; findings below may reference images not displayed]

FINDINGS: Brain: No evidence of acute infarction, hemorrhage, hydrocephalus,
extra-axial collection or mass lesion/mass effect. Interval
development of mild white matter hypodensity most prominent the
parietal lobes bilaterally. Probable chronic microvascular ischemia.

Vascular: Negative for hyperdense vessel

Skull: Negative

Sinuses/Orbits: Paranasal sinuses clear. Bilateral cataract
extraction

Other: None
IMPRESSION: No acute abnormality. Mild white matter hypodensity most consistent
with chronic microvascular ischemia

## 2023-01-24 ENCOUNTER — Encounter: Payer: 59 | Admitting: Speech Pathology

## 2023-02-07 ENCOUNTER — Encounter: Payer: 59 | Admitting: Speech Pathology

## 2023-06-15 IMAGING — CT CT HEAD W/O CM
3 of 4 series · 13 of 47 positions shown, 15 images · non-contrast
Comparison: 03/25/2021

CLINICAL DATA: Dizziness and lightheadedness



[Series 2: axial st head 5.00 ax · axial · 0.33mm/px · z∈[-562,-468]mm · 7 of 27 slices shown, 9 images]
[im 4/27  brain]
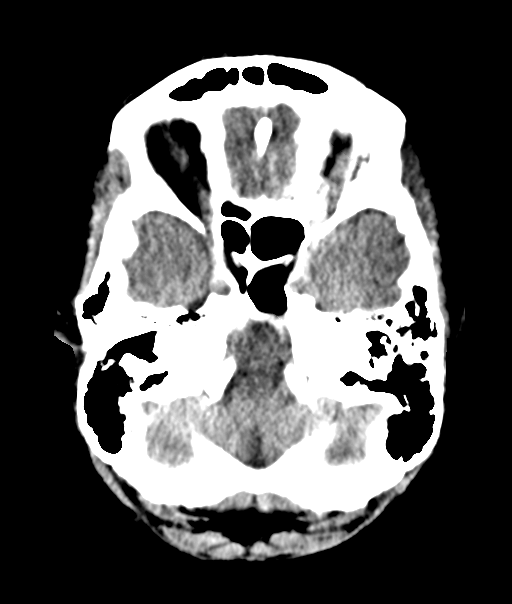
[im 4/27  bone]
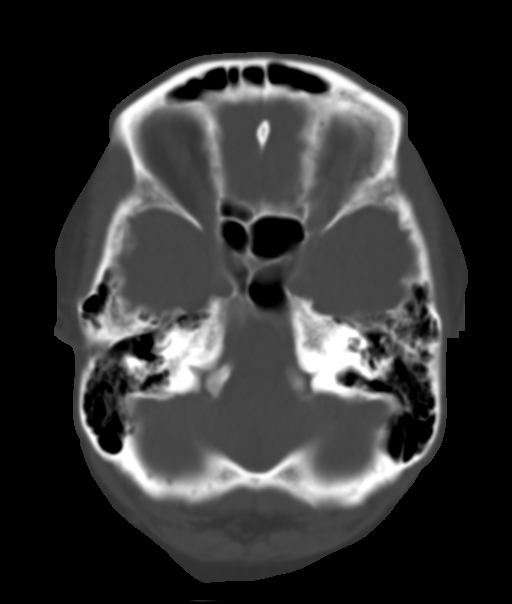
[im 7/27  brain]
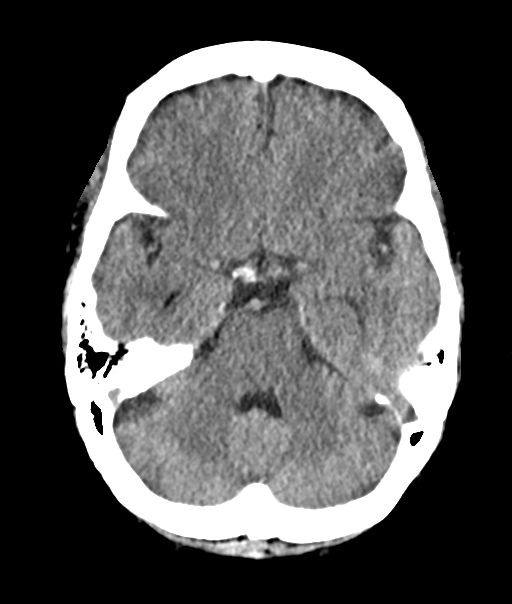
[im 10/27  brain]
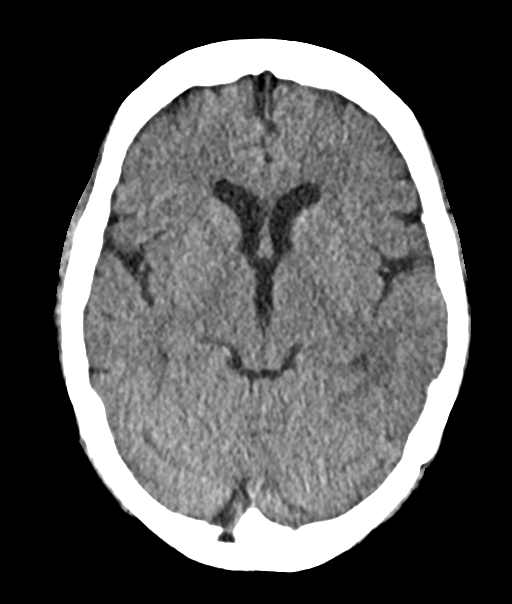
[im 14/27  brain]
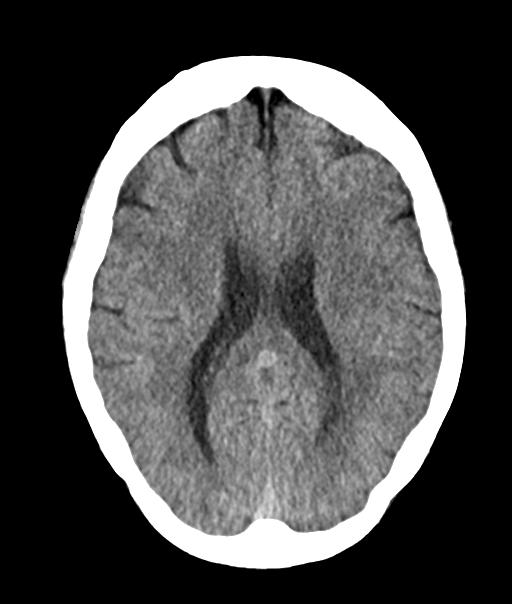
[im 17/27  brain]
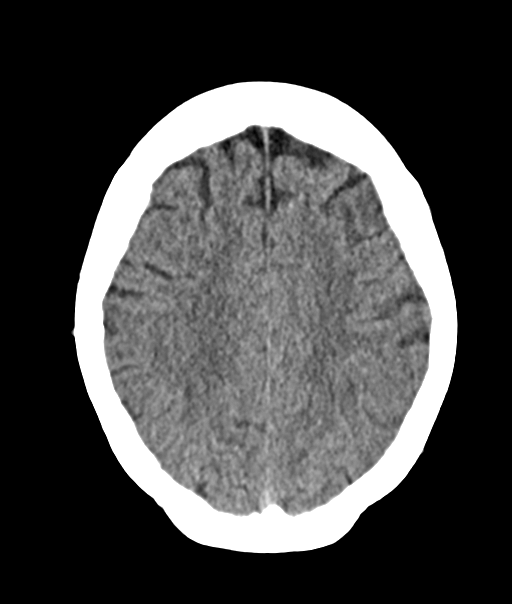
[im 17/27  bone]
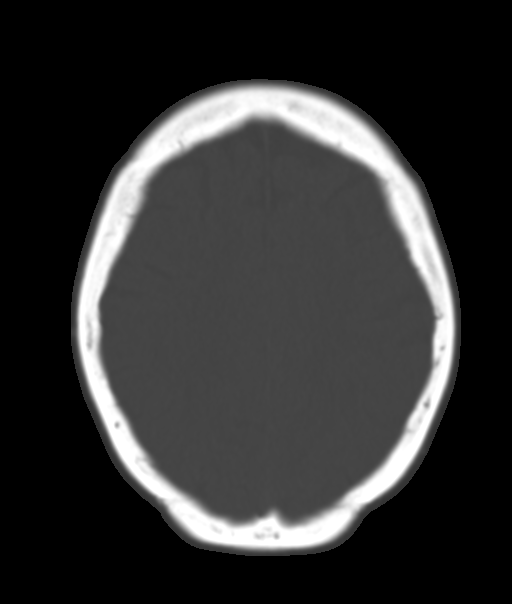
[im 20/27  brain]
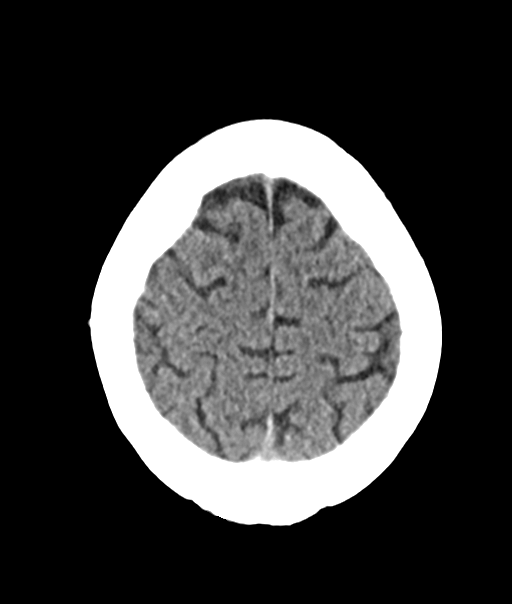
[im 23/27  brain]
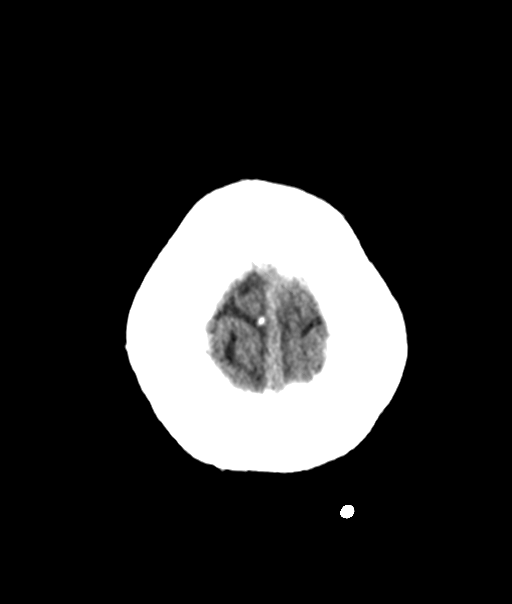

[Series 6: coronals head 3.00 cor · coronal · 0.27mm/px · 3 of 66 slices shown]
[im 22/66  brain]
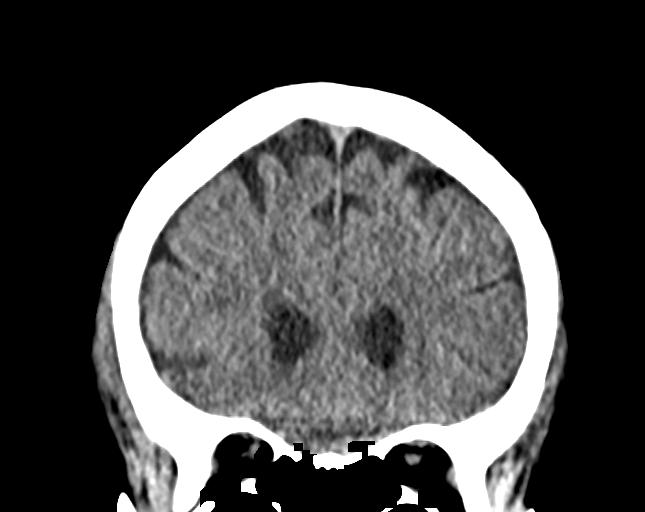
[im 29/66  brain]
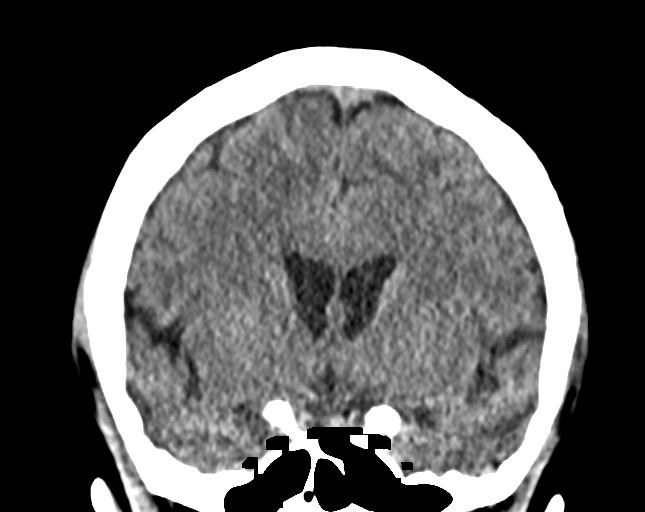
[im 37/66  brain]
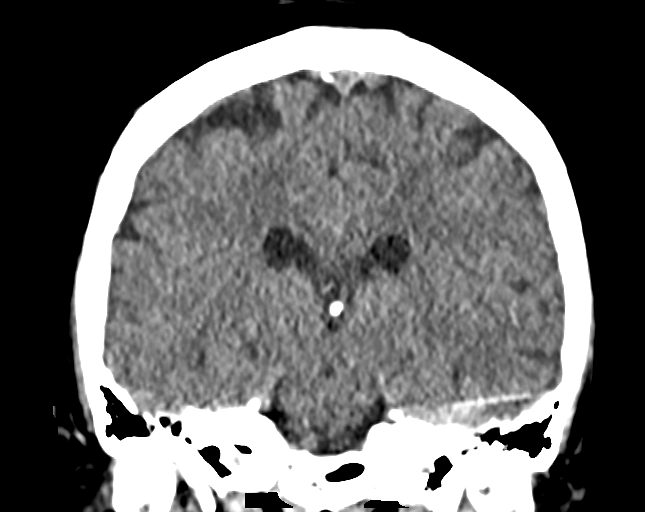

[Series 8: sagittals head 3.00 sag · sagittal · 0.27mm/px · 3 of 57 slices shown]
[im 19/57  brain]
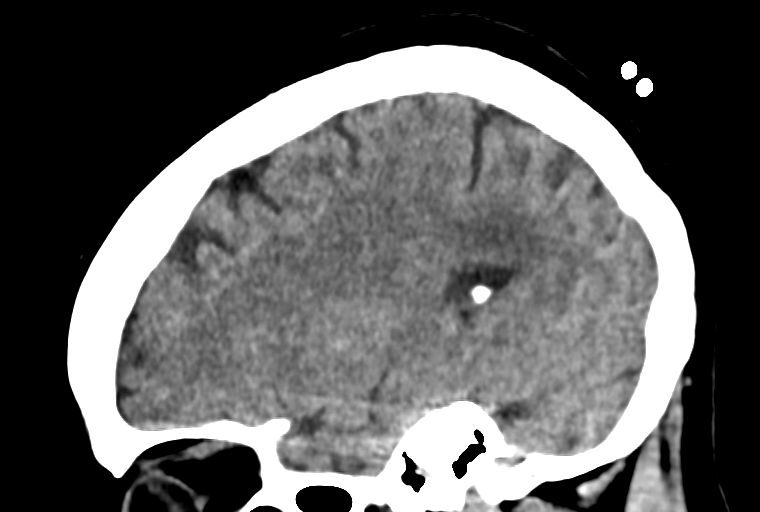
[im 29/57  brain]
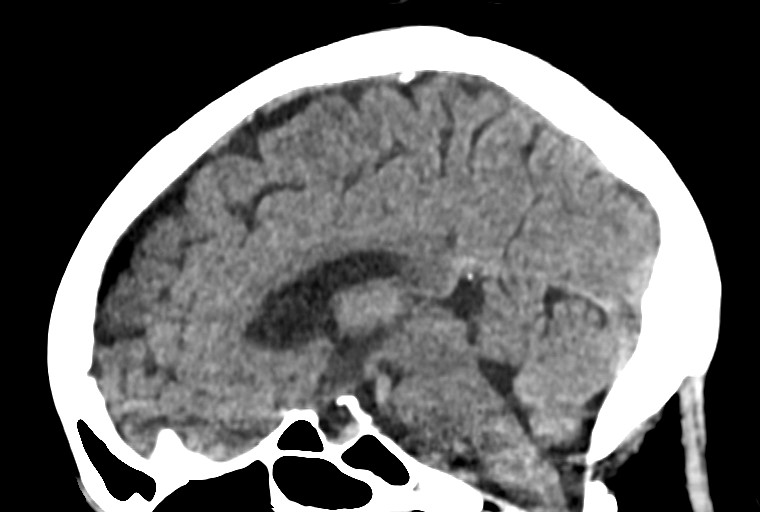
[im 38/57  brain]
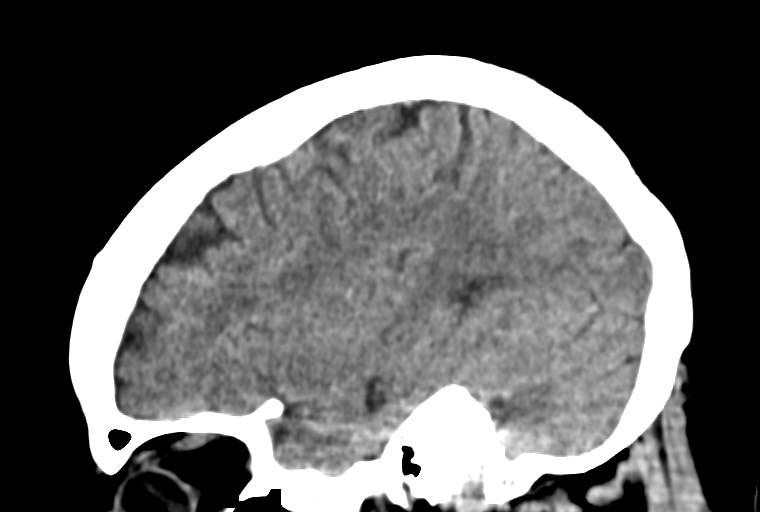

[13 of 47 positions shown; findings below may reference images not displayed]

FINDINGS: Brain: No evidence of acute infarction, hemorrhage, hydrocephalus,
extra-axial collection or mass lesion/mass effect.

Vascular: No hyperdense vessel or unexpected calcification.

Skull: Normal. Negative for fracture or focal lesion.

Sinuses/Orbits: No acute finding.

Other: None.
IMPRESSION: No acute intracranial pathology.

## 2023-10-21 ENCOUNTER — Encounter: Payer: Self-pay | Admitting: Internal Medicine

## 2023-10-21 DIAGNOSIS — Z1231 Encounter for screening mammogram for malignant neoplasm of breast: Secondary | ICD-10-CM

## 2023-10-28 ENCOUNTER — Encounter: Payer: Self-pay | Admitting: Internal Medicine

## 2023-10-28 DIAGNOSIS — Z1231 Encounter for screening mammogram for malignant neoplasm of breast: Secondary | ICD-10-CM

## 2023-10-29 ENCOUNTER — Other Ambulatory Visit: Payer: Self-pay | Admitting: Internal Medicine

## 2023-10-29 DIAGNOSIS — N6322 Unspecified lump in the left breast, upper inner quadrant: Secondary | ICD-10-CM

## 2023-10-29 DIAGNOSIS — Z1231 Encounter for screening mammogram for malignant neoplasm of breast: Secondary | ICD-10-CM

## 2023-11-01 ENCOUNTER — Encounter

## 2023-11-01 ENCOUNTER — Other Ambulatory Visit

## 2023-11-04 ENCOUNTER — Ambulatory Visit
Admission: RE | Admit: 2023-11-04 | Discharge: 2023-11-04 | Disposition: A | Discharge status: Home / Self Care | Source: Ambulatory Visit | Attending: Internal Medicine | Admitting: Internal Medicine

## 2023-11-04 DIAGNOSIS — Z1231 Encounter for screening mammogram for malignant neoplasm of breast: Secondary | ICD-10-CM | POA: Diagnosis present

## 2023-11-04 DIAGNOSIS — N6322 Unspecified lump in the left breast, upper inner quadrant: Secondary | ICD-10-CM

## 2023-11-05 ENCOUNTER — Encounter: Payer: Self-pay | Admitting: Pulmonary Disease

## 2023-11-05 ENCOUNTER — Ambulatory Visit: Admitting: Pulmonary Disease

## 2023-11-05 VITALS — BP 112/60 | HR 58 | Temp 99.1°F | Ht 66.0 in | Wt 183.0 lb

## 2023-11-05 DIAGNOSIS — J4520 Mild intermittent asthma, uncomplicated: Secondary | ICD-10-CM

## 2023-11-05 DIAGNOSIS — I340 Nonrheumatic mitral (valve) insufficiency: Secondary | ICD-10-CM

## 2023-11-05 DIAGNOSIS — I5022 Chronic systolic (congestive) heart failure: Secondary | ICD-10-CM

## 2023-11-05 LAB — NITRIC OXIDE: Nitric Oxide: 16

## 2023-11-05 MED ORDER — ALBUTEROL SULFATE HFA 108 (90 BASE) MCG/ACT IN AERS: 1.0000 | INHALATION_SPRAY | Freq: Four times a day (QID) | RESPIRATORY_TRACT | 1 refills | Status: AC | PRN

## 2023-11-05 NOTE — Progress Notes (Signed)
 Subjective:    Patient ID: Anna Romero, female    DOB: May 10, 1951, 73 y.o.   MRN: [id number]  Patient Care Team: Rex Castor, MD as PCP - General (Internal Medicine) Rochell Chroman, RN as Oncology Nurse Navigator  Chief Complaint  Patient presents with   Follow-up    No breathing problems.     BACKGROUND: 73 y.o. female never smoker with documented h/o OSA (though PSG in 2016 revealed no OSA), documented h/o asthma and PAH by echocardiogram. 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 Previously followed for pulmonary issues by Dr Jamal Mays.  She has not been seen at the clinic since 23 May 2021.   HPI Discussed the use of AI scribe software for clinical note transcription with the patient, who gave verbal consent to proceed.  History of Present Illness   Anna Romero is a 73 year old female with asthma who presents for a follow-up regarding her asthma management.  The patient is a very sparse historian  She has not been using her Trelegy inhaler recently as she felt asymptomatic and was not experiencing any problems.  She cannot recall when she stopped using the Trelegy inhaler but it was likely over a year ago.  However, she is concerned about exposure to chemicals when her husband works on cars and around the house, which makes her feel she needs to have an albuterol  inhaler available.  She apparently got a prescription for albuterol  inhaler from primary care in May.  She states that she lost that inhaler and cannot find it that she cannot recall when she needed that inhaler.  She does not elaborate on any symptoms.  She has not had any fevers, chills or sweats.  No cough or sputum production.  No hemoptysis.    She does follow with cardiology regularly given that she has a cardiomyopathy.  She follows at [facility].     DATA: PFTs 10/06/14 Ivette Marks): FVC 2.64  (110% predicted),  FEV1 1.71 L  (88% predicted),  FEV1/FVC 64%, TLC (77% pred), DLCO (72% pred), DLCO/VA (102% pred) PSG 03/02/15: no significant apnea. PLM index 9.1, PLM arousal index 1.7 Echo 05/24/15: LVEF 40%, global HK, moderate MR, RVSP 45 mmHg CT chest 12/02/15: Left lower lobe scarring with associated pleural thickening and trace pleural effusion, chronic. No evidence of acute cardiopulmonary disease Echo 07/09/17: LVEF 40%, global HK, moderate MR, RVSP 43 mmHg Overnight oximetry 11/2017: No significant nocturnal desaturations PFTs 01/14/2018: FVC: 2.07 > 2.12 L (66 > 68 %pred), FEV1: 1.40 > 1.49 L (56 > 60 %pred), FEV1/FVC: 68%, TLC: 3.54 L (68 %pred), DLCO 41 %pred, DLCO/VA 102% predicted.  No significant response to bronchodilator except for 34% improvement in FEF 25-75%  Review of Systems A 10 point review of systems was performed and it is as noted above otherwise negative.   Past Medical History:  Diagnosis Date   Anxiety    Asthma    CHF (congestive heart failure) (HCC)    Chronic kidney disease    Renal Insufficiency Stage 3   Degenerative disc disease, cervical    Depression    Endometrial cancer (HCC)    GERD (gastroesophageal reflux disease)    Hyperlipidemia    Hypertension    Mixed restrictive and obstructive lung disease (HCC)    Predominantly restriction due to chronic volume loss in the left lung.  Mild small airways disease.   Moderate mitral insufficiency    Monoclonal gammopathy  Polymyositis (HCC)    Pulmonary hypertension (HCC)     Past Surgical History:  Procedure Laterality Date   APPENDECTOMY     BREAST EXCISIONAL BIOPSY Left 1990   negative   CATARACT EXTRACTION     COLONOSCOPY N/A 08/27/2016   Procedure: COLONOSCOPY;  Surgeon: Deveron Fly, MD;  Location: [facility] ENDOSCOPY;  Service: Endoscopy;  Laterality: N/A;   COLONOSCOPY WITH PROPOFOL  N/A 07/22/2018   Procedure: COLONOSCOPY WITH PROPOFOL ;  Surgeon: Deveron Fly, MD;  Location: [facility] ENDOSCOPY;  Service: Endoscopy;  Laterality:  N/A;   COLONOSCOPY WITH PROPOFOL  N/A 10/05/2022   Procedure: COLONOSCOPY WITH PROPOFOL ;  Surgeon: Shane Darling, MD;  Location: [facility] ENDOSCOPY;  Service: Endoscopy;  Laterality: N/A;  PREFERS PM   DILATION AND CURETTAGE OF UTERUS     ESOPHAGOGASTRODUODENOSCOPY (EGD) WITH PROPOFOL  N/A 10/05/2022   Procedure: ESOPHAGOGASTRODUODENOSCOPY (EGD) WITH PROPOFOL ;  Surgeon: Shane Darling, MD;  Location: [facility] ENDOSCOPY;  Service: Endoscopy;  Laterality: N/A;   EYE SURGERY     HYSTEROSCOPY WITH D & C N/A 10/12/2019   Procedure: DILATATION AND CURETTAGE /HYSTEROSCOPY, POLYPECTOMY OR MYOMECTOMY;  Surgeon: Prescilla Brod, MD;  Location: [facility] ORS;  Service: Gynecology;  Laterality: N/A;   JOINT REPLACEMENT     REPLACEMENT TOTAL HIP W/  RESURFACING IMPLANTS Right    THYMECTOMY      Patient Active Problem List   Diagnosis Date Noted   Endometrial cancer (HCC) 10/28/2019   Cough 08/03/2019   Mild intermittent asthma 12/16/2018   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/16/2018   Depression 07/15/2018   Polymyositis (HCC) 05/30/2017   Chronic kidney insufficiency, stage 3 (moderate) (HCC) 03/26/2017   Breast calcification, right 01/30/2017   Polyarthralgia 10/12/2016   Myalgia 10/12/2016   Right elbow pain 10/12/2016   Bradycardia 04/04/2016   Chronic insomnia 03/31/2016   Mild episode of recurrent major depressive disorder (HCC) 03/31/2016   Cognitive impairment 03/26/2016   Gastroesophageal reflux 09/23/2015   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   Moderate mitral insufficiency 05/24/2015   Chronic systolic CHF (congestive heart failure), NYHA class 2 (HCC) 04/18/2015   High risk medication use 03/21/2015   Benign essential hypertension 10/01/2014   Hypomagnesemia 09/08/2014   Vitamin D deficiency, unspecified 09/08/2014   Osteoarthritis 04/05/2014   Degenerative disc disease, cervical 03/29/2014   Hyperlipidemia, mixed 03/29/2014   Monoclonal  gammopathy 03/29/2014   Pulmonary hypertension (HCC) 10/21/2013    Family History  Adopted: Yes  Problem Relation Age of Onset   Breast cancer Neg Hx     Social History   Tobacco Use   Smoking status: Never   Smokeless tobacco: Never  Substance Use Topics   Alcohol use: No    Allergies  Allergen Reactions   Hctz [Hydrochlorothiazide] Other (See Comments)    Kidney Disorder    Current Meds  Medication Sig   acetaminophen (TYLENOL) 500 MG tablet Take 1,000 mg by mouth every 8 (eight) hours as needed for moderate pain. Needs daily   carvedilol (COREG) 3.125 MG tablet Take 1 tablet by mouth 2 (two) times daily with a meal.   furosemide (LASIX) 20 MG tablet Take 20 mg by mouth daily as needed for fluid. Takes every few days   Multiple Vitamins-Minerals (MULTIVITAMIN WITH MINERALS) tablet Take 1 tablet by mouth daily.   spironolactone (ALDACTONE) 25 MG tablet Take 25 mg by mouth daily.   Suvorexant (BELSOMRA PO) Take 10 mg by mouth every evening. Takes a few nights per week  telmisartan (MICARDIS) 40 MG tablet Take 40 mg by mouth daily.   [DISCONTINUED] albuterol  (VENTOLIN  HFA) 108 (90 Base) MCG/ACT inhaler Inhale 1-2 puffs into the lungs every 6 (six) hours as needed for wheezing or shortness of breath.   [DISCONTINUED] carvedilol (COREG) 6.25 MG tablet Take 6.25 mg by mouth 2 (two) times daily.    Immunization History  Administered Date(s) Administered   Influenza Inj Mdck Quad Pf 02/20/2016, 03/13/2019, 03/13/2022   Influenza Split 02/18/2014   Influenza, High Dose Seasonal PF 02/20/2016, 01/16/2018, 03/13/2019, 06/20/2023   Influenza,inj,Quad PF,6+ Mos 02/22/2017   Influenza-Unspecified 02/08/2012, 03/03/2013, 02/18/2014, 03/18/2015, 02/22/2017, 03/08/2020   Moderna Covid-19 Fall Seasonal Vaccine 30yrs & older 06/20/2023   PFIZER Comirnaty(Gray Top)Covid-19 Tri-Sucrose Vaccine 07/16/2019, 08/12/2019, 02/27/2020, 09/23/2020   PFIZER(Purple Top)SARS-COV-2 Vaccination  07/16/2019, 08/12/2019   PNEUMOCOCCAL CONJUGATE-20 03/13/2022   Pfizer Covid-19 Vaccine Bivalent Booster 19yrs & up 03/08/2021   Pneumococcal Polysaccharide-23 03/26/2016   Rabies Immune Globulin  10/29/2015, 11/01/2015, 11/05/2015, 11/12/2015   Rabies, IM 10/29/2015, 11/01/2015, 11/05/2015, 11/12/2015   Td 12/25/1994   Tdap 10/28/2015        Objective:     BP 112/60 (BP Location: Left Arm, Patient Position: Sitting, Cuff Size: Normal)   Pulse (!) 58   Temp 99.1 F (37.3 C) (Oral)   Ht 5' 6 (1.676 m)   Wt 183 lb (83 kg)   SpO2 96%   BMI 29.54 kg/m   SpO2: 96 %  GENERAL: Awake, alert, fully ambulatory.  No respiratory distress.  She is poorly kempt and seems somewhat depressed. HEAD: Normocephalic, atraumatic.  EYES: Pupils equal, round, reactive to light.  No scleral icterus.  MOUTH: Nose/mouth/throat not examined due to patient masking. NECK: Supple. No thyromegaly. No nodules. No JVD.  PULMONARY: Symmetrical breath sounds.   No adventitious sounds. CARDIOVASCULAR: S1 and S2. Regular rate and rhythm.  Grade 2/6 systolic ejection murmur at the axilla consistent with mitral regurgitation. GASTROINTESTINAL: Nondistended MUSCULOSKELETAL: No joint deformity, no clubbing, no edema.  NEUROLOGIC: No overt focal deficits noted.  No gait disturbance noted on ambulation. SKIN: Intact,warm,dry.  On limited exam no rashes. PSYCH: Flat affect, pressed.   Lab Results  Component Value Date   NITRICOXIDE 16 11/05/2023  *No evidence of type II inflammation       Assessment & Plan:     ICD-10-CM   1. Mild intermittent asthma without complication  J45.20 Nitric oxide    2. Chronic systolic CHF (congestive heart failure), NYHA class 2 (HCC)  I50.22     3. Moderate mitral insufficiency  I34.0       Orders Placed This Encounter  Procedures   Nitric oxide    Meds ordered this encounter  Medications   albuterol  (VENTOLIN  HFA) 108 (90 Base) MCG/ACT inhaler    Sig: Inhale 1-2  puffs into the lungs every 6 (six) hours as needed for wheezing or shortness of breath.    Dispense:  8 g    Refill:  1   Discussion:    Mild intermittent asthma Mild intermittent asthma with no recent exacerbations. She has not been using Trelegy but reports no current symptoms. No evidence of airway inflammation on recent testing. Experiences concern when exposed to chemicals due to husband's activities, indicating a need for an emergency inhaler.  Not interested in maintenance inhaler, does not feel she needs it - Prescribe albuterol  inhaler with 1 refill to [facility] on [address]. - Advise use of albuterol  inhaler as needed. - Instruct follow-up with primary care physician,  Dr. Primus Brookes. - Follow-up with pulmonary on an as-needed basis.    Advised if symptoms do not improve or worsen, to please contact office for sooner follow up or seek emergency care.    I spent 35 minutes of dedicated to the care of this patient on the date of this encounter to include pre-visit review of records, face-to-face time with the patient discussing conditions above, post visit ordering of testing, clinical documentation with the electronic health record, making appropriate referrals as documented, and communicating necessary findings to members of the patients care team.   C. Chloe Counter, MD Advanced Bronchoscopy PCCM [facility]    *This note was dictated using voice recognition software/Dragon.  Despite best efforts to proofread, errors can occur which can change the meaning. Any transcriptional errors that result from this process are unintentional and may not be fully corrected at the time of dictation.

## 2023-11-05 NOTE — Patient Instructions (Addendum)
 VISIT SUMMARY:  You had a follow-up appointment today to discuss your asthma management. You mentioned that you have not been using your Trelegy inhaler recently because you felt fine, but you are concerned about exposure to chemicals when your husband works on cars and around the house.  YOUR PLAN:  -MILD INTERMITTENT ASTHMA: Mild intermittent asthma means you have occasional asthma symptoms that do not occur daily. You have not been using your Trelegy inhaler recently and have no current symptoms. However, due to your concern about exposure to chemicals, you will be prescribed an albuterol  inhaler. Please use the albuterol  inhaler as needed. A prescription for the albuterol  inhaler with one refills has been sent to [facility] on [address].  INSTRUCTIONS:  Please follow up with your primary care physician, Dr. Primus Brookes, follow-up with here on an as-needed basis.

## 2024-01-01 ENCOUNTER — Inpatient Hospital Stay: Payer: 59

## 2024-01-22 ENCOUNTER — Inpatient Hospital Stay: Attending: Obstetrics and Gynecology | Admitting: Obstetrics and Gynecology

## 2024-01-22 ENCOUNTER — Encounter: Payer: Self-pay | Admitting: Obstetrics and Gynecology

## 2024-01-22 VITALS — BP 129/70 | HR 66 | Temp 97.0°F | Resp 18 | Wt 184.2 lb

## 2024-01-22 DIAGNOSIS — Z90710 Acquired absence of both cervix and uterus: Secondary | ICD-10-CM | POA: Diagnosis not present

## 2024-01-22 DIAGNOSIS — Z9079 Acquired absence of other genital organ(s): Secondary | ICD-10-CM | POA: Insufficient documentation

## 2024-01-22 DIAGNOSIS — Z8542 Personal history of malignant neoplasm of other parts of uterus: Secondary | ICD-10-CM | POA: Diagnosis present

## 2024-01-22 DIAGNOSIS — Z90722 Acquired absence of ovaries, bilateral: Secondary | ICD-10-CM | POA: Insufficient documentation

## 2024-01-22 DIAGNOSIS — C541 Malignant neoplasm of endometrium: Secondary | ICD-10-CM
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
## 2024-02-24 ENCOUNTER — Telehealth (HOSPITAL_COMMUNITY): Payer: Self-pay

## 2024-02-24 NOTE — Telephone Encounter (Signed)
 [employer] Coordinator received routed [employer] referral/interest from manager Shawn T. Coordinator will review chart then call patient to screen.

## 2024-02-24 NOTE — Telephone Encounter (Signed)
 TMS Coordinator placed call to pt to screen for TMS treatment. Pt stated they did not have the time currently for screening to be conducted. Coordinator asked when they would like for them to give pt a call to screen, pt elected tomorrow (02/25/24) at 2pm. Pt asked questions about potential transportation services and insurance coverage. Coordinator told pt they were unsure of transportation services through [facility] but would see what they could find out. As for insurance coverage, Coordinator advised pt to call their insurance company and ask for a verification of benefits, as Coordinator is unable to determine cost. Coordinator provided [employer] CPT codes and pt said they will ask if they're covered and if they are covered for transportation through [facility] directly. Patient then thanked Coordinator for their time and will look forward to scheduled call.

## 2024-02-26 ENCOUNTER — Telehealth (HOSPITAL_COMMUNITY): Payer: Self-pay

## 2024-02-26 NOTE — Telephone Encounter (Signed)
 TMS Coordinator placed call to pt to discuss insurance EOB and transportation details. Pt relayed that they spoke with their payer [facility] Medicare) and first CSR said they would be covered for TMS but they need to speak to a different dept. Pt was transferred to another line where pt claims I waited on hold for two hours and no one came to the phone. Pt mentioned later on in the call that a 2nd person stated that Coordinator would need to call them directly for EOB. Coordinator informed pt they would see what they could find out, but it may require pt to call again as verification is for their own financial literacy/education. Coordinator informed pt that there is no transportation service for behavioral health, so would need to verify with their insurance as well. Coordinator relayed that based on medical guidelines of payer, they will require a course of psychotherapy. Coordinator explained psychotherapy definition and pt stated they would call their psychiatrist today to get an appointment set up.

## 2024-02-26 NOTE — Telephone Encounter (Signed)
 [employer] Coordinator placed call to pt's insurance to verify [facility] coverage for TMS tx. Coordinator spoke with 4 CSR's (Cain.R, Alex D, Christine U, and Rhy R). Last CSR verified that pt would be covered under current plan, and prior authorization IS required. Pt has met their deductible for the year, but has not met their OOP ($578 of $9,350 met).

## 2024-03-23 ENCOUNTER — Telehealth (HOSPITAL_COMMUNITY): Payer: Self-pay

## 2024-03-23 NOTE — Telephone Encounter (Signed)
 TMS Coordinator placed call to pt after receiving vm after hours on Friday (03/20/24). Pt stated they had been waiting on Coordinator to get back with them. Coordinator clarified that based on notes, they were waiting to see if pt had started course of psychotherapy d/t insurance requirement. Pt said oh right, I remember now. Coordinator re-explained process and asked if pt had any additional questions, pt declined. Pt will let Coordinator know when PT is started to gauge next steps.

## 2024-03-24 ENCOUNTER — Other Ambulatory Visit: Payer: Self-pay | Admitting: Licensed Clinical Social Worker

## 2024-03-24 ENCOUNTER — Inpatient Hospital Stay

## 2024-03-24 ENCOUNTER — Inpatient Hospital Stay: Attending: Obstetrics and Gynecology | Admitting: Licensed Clinical Social Worker

## 2024-03-24 DIAGNOSIS — Z1379 Encounter for other screening for genetic and chromosomal anomalies: Secondary | ICD-10-CM

## 2024-03-24 DIAGNOSIS — Z8542 Personal history of malignant neoplasm of other parts of uterus: Secondary | ICD-10-CM | POA: Insufficient documentation

## 2024-03-24 DIAGNOSIS — Z90722 Acquired absence of ovaries, bilateral: Secondary | ICD-10-CM | POA: Insufficient documentation

## 2024-03-24 DIAGNOSIS — Z9079 Acquired absence of other genital organ(s): Secondary | ICD-10-CM | POA: Insufficient documentation

## 2024-03-24 DIAGNOSIS — Z90710 Acquired absence of both cervix and uterus: Secondary | ICD-10-CM | POA: Diagnosis not present

## 2024-03-24 DIAGNOSIS — C541 Malignant neoplasm of endometrium: Secondary | ICD-10-CM

## 2024-03-24 DIAGNOSIS — Z860100 Personal history of colon polyps, unspecified: Secondary | ICD-10-CM | POA: Diagnosis present

## 2024-03-24 LAB — GENETIC SCREENING ORDER

## 2024-03-24 NOTE — Progress Notes (Signed)
 REFERRING PROVIDER: Jane Delmar Pike, NP [address] [facility]- [facility] [place],  [place] [address]  PRIMARY PROVIDER:  Sherial Bail, MD  PRIMARY REASON FOR VISIT:  1. History of colonic polyps   2. Endometrial cancer (HCC)      HISTORY OF PRESENT ILLNESS:   Ms. Privette, a 73 y.o. female, was seen for a [place] cancer genetics consultation at the request of Dr. Jane due to a family history of colon polyps.  Ms. Lalla presents to clinic today to discuss the possibility of a hereditary predisposition to cancer, genetic testing, and to further clarify her future cancer risks, as well as potential cancer risks for family members.   CANCER HISTORY:  In 2021, at the age of 51, Ms. Hearn was diagnosed with endometrial cancer, MMR proteins intact.  This was treated with TLH-BSO.   RELEVANT MEDICAL HISTORY:  Ovaries intact: no.  Hysterectomy: yes.  Menopausal status: postmenopausal.  Colonoscopy: yes; 2024- 1 adenoma; 2020- 1 tubulovillous adenoma + 1 adenoma; 2018 - 1 polyps; 2014- 2 tubulovillous adenomas; 2013- 1 adenoma; other colonoscopies in 2001, 2004 and 2007 with reported polyps = greater than 10 cumulative adenomas . Mammogram within the last year: yes.    Past Medical History:  Diagnosis Date   Anxiety    Asthma    CHF (congestive heart failure) (HCC)    Chronic kidney disease    Renal Insufficiency Stage 3   Degenerative disc disease, cervical    Depression    Endometrial cancer (HCC)    GERD (gastroesophageal reflux disease)    Hyperlipidemia    Hypertension    Mixed restrictive and obstructive lung disease (HCC)    Predominantly restriction due to chronic volume loss in the left lung.  Mild small airways disease.   Moderate mitral insufficiency    Monoclonal gammopathy    Polymyositis (HCC)    Pulmonary hypertension (HCC)     Past Surgical History:  Procedure Laterality Date   APPENDECTOMY     BREAST EXCISIONAL BIOPSY Left  1990   negative   CATARACT EXTRACTION     COLONOSCOPY N/A 08/27/2016   Procedure: COLONOSCOPY;  Surgeon: Gladis RAYMOND Mariner, MD;  Location: [facility] ENDOSCOPY;  Service: Endoscopy;  Laterality: N/A;   COLONOSCOPY WITH PROPOFOL  N/A 07/22/2018   Procedure: COLONOSCOPY WITH PROPOFOL ;  Surgeon: Mariner Gladis RAYMOND, MD;  Location: [facility] ENDOSCOPY;  Service: Endoscopy;  Laterality: N/A;   COLONOSCOPY WITH PROPOFOL  N/A 10/05/2022   Procedure: COLONOSCOPY WITH PROPOFOL ;  Surgeon: Maryruth Ole DASEN, MD;  Location: [facility] ENDOSCOPY;  Service: Endoscopy;  Laterality: N/A;  PREFERS PM   DILATION AND CURETTAGE OF UTERUS     ESOPHAGOGASTRODUODENOSCOPY (EGD) WITH PROPOFOL  N/A 10/05/2022   Procedure: ESOPHAGOGASTRODUODENOSCOPY (EGD) WITH PROPOFOL ;  Surgeon: Maryruth Ole DASEN, MD;  Location: [facility] ENDOSCOPY;  Service: Endoscopy;  Laterality: N/A;   EYE SURGERY     HYSTEROSCOPY WITH D & C N/A 10/12/2019   Procedure: DILATATION AND CURETTAGE /HYSTEROSCOPY, POLYPECTOMY OR MYOMECTOMY;  Surgeon: Verdon Keen, MD;  Location: [facility] ORS;  Service: Gynecology;  Laterality: N/A;   JOINT REPLACEMENT     REPLACEMENT TOTAL HIP W/  RESURFACING IMPLANTS Right    THYMECTOMY      FAMILY HISTORY:  We obtained a detailed, 4-generation family history.  Significant diagnoses are listed below: Family History  Adopted: Yes  Problem Relation Age of Onset   Breast cancer Neg Hx    Ms. Kitts was adopted and has limited information about biological relatives. No known  cancers.   Ms. Roa is unaware of previous family history of genetic testing for hereditary cancer risks. There is no reported Ashkenazi Jewish ancestry. There is no known consanguinity.    GENETIC COUNSELING ASSESSMENT: Ms. Lafosse is a 73 y.o. female with a personal history of endometrial cancer and colon polyps which is somewhat suggestive of a hereditary cancer syndrome and predisposition to cancer. We, therefore, discussed and recommended the following at  today's visit.   DISCUSSION:  We discussed that polyps in general are common, however, most people have fewer than 5 lifetime polyps.  When an individual has 10 or more polyps we become concerned about an underlying polyposis syndrome.  The most common hereditary polyposis syndromes are caused by problems in the APC and MUTYH genes. We discussed that approximately 10% of cancer is hereditary. Most cases of hereditary endometrial cancer are associated with Lynch syndrome genes, although there are other genes associated with hereditary cancer as well. Cancers and risks are gene specific. We discussed that testing is beneficial for several reasons including knowing about cancer risks, identifying potential screening and risk-reduction options that may be appropriate, and to understand if other family members could be at risk for cancer and allow them to undergo genetic testing.   We reviewed the characteristics, features and inheritance patterns of hereditary cancer syndromes. We also discussed genetic testing, including the appropriate family members to test, the process of testing, insurance coverage and turn-around-time for results. We discussed the implications of a negative, positive and/or variant of uncertain significant result. We recommended Ms. Scarber pursue genetic testing for the [facility] CancerNext-Expanded+RNA gene panel.   The CancerNext-Expanded gene panel offered by [facility] and includes sequencing, rearrangement, and RNA analysis for the following 77 genes: AIP, ALK, APC, ATM, AXIN2, BAP1, BARD1, BMPR1A, BRCA1, BRCA2, BRIP1, CDC73, CDH1, CDK4, CDKN1B, CDKN2A, CEBPA, CHEK2, CTNNA1, DDX41, DICER1, ETV6, FH, FLCN, GATA2, LZTR1, MAX, MBD4, MEN1, MET, MLH1, MSH2, MSH3, MSH6, MUTYH, NF1, NF2, NTHL1, PALB2, PHOX2B, PMS2, POT1, PRKAR1A, PTCH1, PTEN, RAD51C, RAD51D, RB1, RET, RPS20, RUNX1, SDHA, SDHAF2, SDHB, SDHC, SDHD, SMAD4, SMARCA4, SMARCB1, SMARCE1, STK11, SUFU, TMEM127, TP53, TSC1, TSC2, VHL,  and WT1 (sequencing and deletion/duplication); EGFR, HOXB13, KIT, MITF, PDGFRA, POLD1, and POLE (sequencing only); EPCAM and GREM1 (deletion/duplication only).   Based on Ms. Hoke's personal history of cancer and colon polyps,  she meets medical criteria for genetic testing. Despite that she meets criteria, she may still have an out of pocket cost.   PLAN: After considering the risks, benefits, and limitations, Ms. Wisby provided informed consent to pursue genetic testing and the blood sample was sent to [facility] for analysis of the CancerNext-Expanded+RNA Panel. Results should be available within approximately 2-3 weeks' time, at which point they will be disclosed by telephone to Ms. Mullings, as will any additional recommendations warranted by these results. Ms. Yates will receive a summary of her genetic counseling visit and a copy of her results once available. This information will also be available in Epic.   Ms. Mcdermott questions were answered to her satisfaction today. Our contact information was provided should additional questions or concerns arise. Thank you for the referral and allowing us  to share in the care of your patient.   Dena Cary, MS, [facility] Genetic Counselor [email or web address] Phone: [phone]  I personally spent a total of 40 minutes in the care of the patient today including counseling and educating, placing orders, and documenting clinical information in the EHR.  _______________________________________________________________________ For Office Staff:  Number of people  involved in session: 1 Was an Intern/ student involved with case: no

## 2024-03-26 ENCOUNTER — Encounter: Payer: Self-pay | Admitting: *Deleted

## 2024-04-02 ENCOUNTER — Telehealth: Payer: Self-pay | Admitting: Licensed Clinical Social Worker

## 2024-04-02 ENCOUNTER — Encounter: Payer: Self-pay | Admitting: Licensed Clinical Social Worker

## 2024-04-02 ENCOUNTER — Ambulatory Visit: Payer: Self-pay | Admitting: Licensed Clinical Social Worker

## 2024-04-02 DIAGNOSIS — Z1379 Encounter for other screening for genetic and chromosomal anomalies: Secondary | ICD-10-CM

## 2024-04-02 NOTE — Progress Notes (Signed)
 HPI:   Ms. Edgley was previously seen in the [place] [facility] clinic due to a personal history of colon polyps and concerns regarding a hereditary predisposition to cancer. Please refer to our prior cancer genetics clinic note for more information regarding our discussion, assessment and recommendations, at the time. Ms. Finken recent genetic test results were disclosed to her, as were recommendations warranted by these results. These results and recommendations are discussed in more detail below.  CANCER HISTORY:  Oncology History   No history exists.    FAMILY HISTORY:  We obtained a detailed, 4-generation family history.  Significant diagnoses are listed below: Family History  Adopted: Yes  Problem Relation Age of Onset   Breast cancer Neg Hx    Ms. Laureano was adopted and has limited information about biological relatives. No known cancers.    Ms. Kory is unaware of previous family history of genetic testing for hereditary cancer risks. There is no reported Ashkenazi Jewish ancestry. There is no known consanguinity.    GENETIC TEST RESULTS:  The [facility] CancerNext-Expanded+RNA Panel found no pathogenic mutations.   The CancerNext-Expanded gene panel offered by [facility] and includes sequencing, rearrangement, and RNA analysis for the following 77 genes: AIP, ALK, APC, ATM, AXIN2, BAP1, BARD1, BMPR1A, BRCA1, BRCA2, BRIP1, CDC73, CDH1, CDK4, CDKN1B, CDKN2A, CEBPA, CHEK2, CTNNA1, DDX41, DICER1, ETV6, FH, FLCN, GATA2, LZTR1, MAX, MBD4, MEN1, MET, MLH1, MSH2, MSH3, MSH6, MUTYH, NF1, NF2, NTHL1, PALB2, PHOX2B, PMS2, POT1, PRKAR1A, PTCH1, PTEN, RAD51C, RAD51D, RB1, RET, RPS20, RUNX1, SDHA, SDHAF2, SDHB, SDHC, SDHD, SMAD4, SMARCA4, SMARCB1, SMARCE1, STK11, SUFU, TMEM127, TP53, TSC1, TSC2, VHL, and WT1 (sequencing and deletion/duplication); EGFR, HOXB13, KIT, MITF, PDGFRA, POLD1, and POLE (sequencing only); EPCAM and GREM1 (deletion/duplication only).   The test report has been  scanned into EPIC and is located under the Molecular Pathology section of the Results Review tab.  A portion of the result report is included below for reference. Genetic testing reported out on 04/02/2024.      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  At this time, it is unknown if this variant is associated with an increased risk for cancer or if it is benign, but most uncertain variants are reclassified to benign. It should not be used to make medical management decisions. With time, we suspect the laboratory will determine the significance of this variant, if any. If the laboratory reclassifies this variant, we will attempt to contact Ms. Urschel to discuss it further.   Even though a pathogenic variant was not identified, possible explanations for the cancer in the family may include: There may be no hereditary risk for cancer in the family. The cancers in Ms. Tellez and/or her family may be sporadic/familial or due to other genetic and environmental factors. There may be a gene mutation in one of these genes that current testing methods cannot detect but that chance is small. There could be another gene that has not yet been discovered, or that we have not yet tested, that is responsible for the cancer diagnoses in the family.  It is also possible there is a hereditary cause for the cancer in the family that Ms. Carreras did not inherit. Therefore, it is important to remain in touch with cancer genetics in the future so that we can continue to offer Ms. Bomkamp the most up to date genetic testing.   ADDITIONAL GENETIC TESTING:  We discussed with Ms. Goller that her genetic testing was fairly extensive.  If there are additional relevant genes identified to increase cancer risk that can be analyzed in the future, we would be happy to discuss and coordinate this testing at that time.    CANCER SCREENING RECOMMENDATIONS:  Ms. Fillion test result is  considered negative (normal).  This means that we have not identified a hereditary cause for her personal history of cancer or colon polyps at this time.   An individual's cancer risk and medical management are not determined by genetic test results alone. Overall cancer risk assessment incorporates additional factors, including personal medical history, family history, and any available genetic information that may result in a personalized plan for cancer prevention and surveillance. Therefore, it is recommended she continue to follow the cancer management and screening guidelines provided by her  primary healthcare provider.  Colon Cancer Screening: This negative genetic test simply tells us  that we cannot yet define why Ms. Kolinski has had  an increased number of colorectal polyps. Ms. Bertling medical management and screening should be based on the prospect that she  will likely form more colon polyps and should, therefore, undergo more frequent colonoscopy screening at intervals determined by her GI providers.    RECOMMENDATIONS FOR FAMILY MEMBERS:   Since she did not inherit a identifiable mutation in a cancer predisposition gene included on this panel, her children could not have inherited a known mutation from her in one of these genes. Individuals in this family might be at some increased risk of developing cancer, over the general population risk, due to the family history of cancer.  Individuals in the family should notify their providers of the family history of cancer. We recommend women in this family have a yearly mammogram beginning at age 48, or 31 years younger than the earliest onset of cancer, an annual clinical breast exam, and perform monthly breast self-exams.  Family members should have colonoscopies by at age 72, or earlier, as recommended by their providers. We do not recommend familial testing for the MBD4 variant of uncertain significance (VUS).  FOLLOW-UP:  Lastly, we  discussed with Ms. Gruenewald that cancer genetics is a rapidly advancing field and it is possible that new genetic tests will be appropriate for her and/or her family members in the future. We encouraged her to remain in contact with cancer genetics on an annual basis so we can update her personal and family histories and let her know of advances in cancer genetics that may benefit this family.   Our contact number was provided. Ms. Ulloa questions were answered to her satisfaction, and she knows she is welcome to call us  at anytime with additional questions or concerns.    Dena Cary, MS, [facility] Genetic Counselor [email or web address] Phone: [phone]

## 2024-04-02 NOTE — Telephone Encounter (Signed)
 I contacted Ms. Harkey to discuss her genetic testing results. No pathogenic variants were identified in the 77 genes analyzed. Detailed clinic note to follow.   The test report has been scanned into EPIC and is located under the Molecular Pathology section of the Results Review tab.  A portion of the result report is included below for reference.      Dena Cary, MS, [facility] Genetic Counselor [email or web address] Phone: [phone]

## 2024-04-15 ENCOUNTER — Encounter: Payer: Self-pay | Admitting: *Deleted

## 2024-04-23 ENCOUNTER — Telehealth (HOSPITAL_COMMUNITY): Payer: Self-pay

## 2024-04-23 NOTE — Telephone Encounter (Signed)
 [employer] Coordinator received call from pt. Pt requested resources and insurance information again. [employer] Coordinator restated information and alerted pt that they had spoken about this previously. When instructed to call their insurance to receive benefit information, pt became upset. Coordinator explained they are the account holder and will obtain more info on call than Coordinator would. Pt began to understand but remained unhappy about task. Coordinator asked if anything else was needed, pt declined and ended call.

## 2024-04-27 ENCOUNTER — Ambulatory Visit: Payer: Self-pay

## 2024-04-27 NOTE — Telephone Encounter (Signed)
 FYI Only or Action Required?: FYI only for provider: ED advised.  Patient was last seen in primary care on Not established.  Called Nurse Triage reporting Chest Pain.  Symptoms began today.  Interventions attempted: Nothing.  Symptoms are: unchanged.  Triage Disposition: Go to ED Now (Notify PCP)  Patient/caregiver understands and will follow disposition?: Yes Reason for Disposition  [1] Chest pain (or angina) comes and goes AND [2] is happening more often (increasing in frequency) or getting worse (increasing in severity)  (Exception: Chest pains that last only a few seconds.)  Answer Assessment - Initial Assessment Questions Advised ED for medical hx and symptoms. Patient asking how long the wait will be, advised patient I do not know, asked if I thought this would just pass by, advised I do not know that's why I am advising ED. Patient stated ok  1. LOCATION: Where does it hurt?       Under left breast  2. RADIATION: Does the pain go anywhere else? (e.g., into neck, jaw, arms, back)     Denies  3. ONSET: When did the chest pain begin? (Minutes, hours or days)      All day  4. PATTERN: Does the pain come and go, or has it been constant since it started?  Does it get worse with exertion?      Comes and goes, with exertion but not breathing  5. DURATION: How long does it last (e.g., seconds, minutes, hours)     Few seconds  6. SEVERITY: How bad is the pain?  (e.g., Scale 1-10; mild, moderate, or severe)     Sharp pain  7. CARDIAC RISK FACTORS: Do you have any history of heart problems or risk factors for heart disease? (e.g., angina, prior heart attack; diabetes, high blood pressure, high cholesterol, smoker, or strong family history of heart disease)     CHF  8. PULMONARY RISK FACTORS: Do you have any history of lung disease?  (e.g., blood clots in lung, asthma, emphysema, birth control pills)     Asthma  9. CAUSE: What do you think is causing the  chest pain?     Unsure, can't remember if she took her Spironolactone or Telmisartan today  10. OTHER SYMPTOMS: Do you have any other symptoms? (e.g., dizziness, nausea, vomiting, sweating, fever, difficulty breathing, cough)       Denies  Protocols used: Chest Pain-A-AH  Copied from CRM #[id number]. Topic: Clinical - Red Word Triage >> Apr 27, 2024  4:03 PM Alfonso ORN wrote: Red Word that prompted transfer to Nurse Triage: patient did not take all her medication for her heart this morning , suppose to take 3 medications believe only took one of  the medication the carvedilol (COREG) 3.125 MG tablet Patient feel some discomfort , have a little pain under her breast  every now in then , normally do not have the discomfort when take all her medications  Medications that patient suppose to take :  carvedilol (COREG) 3.125 MG tablet,spironolactone (ALDACTONE) 25 MG tablet,telmisartan (MICARDIS) 40 MG tablet

## 2024-04-28 ENCOUNTER — Other Ambulatory Visit: Payer: Self-pay

## 2024-04-28 ENCOUNTER — Emergency Department: Admission: EM | Admit: 2024-04-28 | Discharge: 2024-04-28 | Disposition: A | Discharge status: Home / Self Care

## 2024-04-28 DIAGNOSIS — F339 Major depressive disorder, recurrent, unspecified: Secondary | ICD-10-CM

## 2024-04-28 LAB — COMPREHENSIVE METABOLIC PANEL WITH GFR
ALT: 20 U/L (ref 0–44)
AST: 23 U/L (ref 15–41)
Albumin: 4.3 g/dL (ref 3.5–5.0)
Alkaline Phosphatase: 95 U/L (ref 38–126)
Anion gap: 11 (ref 5–15)
BUN: 20 mg/dL (ref 8–23)
CO2: 27 mmol/L (ref 22–32)
Calcium: 10 mg/dL (ref 8.9–10.3)
Chloride: 103 mmol/L (ref 98–111)
Creatinine, Ser: 0.99 mg/dL (ref 0.44–1.00)
GFR, Estimated: >60 mL/min (ref >60)
Glucose, Bld: 106 mg/dL — ABNORMAL HIGH (ref 70–99)
Potassium: 4.7 mmol/L (ref 3.5–5.1)
Sodium: 140 mmol/L (ref 135–145)
Total Bilirubin: 0.8 mg/dL (ref 0.0–1.2)
Total Protein: 7.2 g/dL (ref 6.5–8.1)

## 2024-04-28 LAB — CBC
HCT: 38.2 % (ref 36.0–46.0)
Hemoglobin: 12.5 g/dL (ref 12.0–15.0)
MCH: 33 pg (ref 26.0–34.0)
MCHC: 32.7 g/dL (ref 30.0–36.0)
MCV: 100.8 fL — ABNORMAL HIGH (ref 80.0–100.0)
Platelets: 176 K/uL (ref 150–400)
RBC: 3.79 MIL/uL — ABNORMAL LOW (ref 3.87–5.11)
RDW: 12.5 % (ref 11.5–15.5)
WBC: 6.2 K/uL (ref 4.0–10.5)
nRBC: 0 % (ref 0.0–0.2)

## 2024-04-28 LAB — ETHANOL: Alcohol, Ethyl (B): <15 mg/dL (ref <15)

## 2024-04-28 MED ORDER — BUPROPION HCL ER (XL) 150 MG PO TB24: 150.0000 mg | ORAL_TABLET | ORAL | 3 refills | Status: AC

## 2024-04-28 MED ORDER — SERTRALINE HCL 50 MG PO TABS: 50.0000 mg | ORAL_TABLET | Freq: Every day | ORAL | 3 refills | Status: AC

## 2024-04-28 NOTE — ED Notes (Signed)
 Pt discharged at this time. RN reviewed discharge instructions with pt. Pt verbalized understanding. Vital signs taken. RR even and unlabored. Pt denies any questions or needs at this time. Pt ambulatory at discharge.

## 2024-04-28 NOTE — ED Provider Notes (Signed)
 [facility] Provider Note    Event Date/Time   First MD Initiated Contact with Patient 04/28/24 1425     (approximate)   History   Depression   HPI  Anna Romero is a 73 y.o. female with a past medical history of depression presenting to the emergency department complaining of depression and intermittent suicidal thoughts.  The patient reports that she has been feeling down for the last few months.  She reports that she is no longer on her depression medications and feels that she needs to be restarted on them.  She reports difficulties with her husband who is bipolar but does not take his medications.  She states that she thought she had an appoint with her psychiatrist tomorrow but then found out that it was not until next month.  She has been having thoughts of hurting herself and so decided to come to the ER for evaluation.  She denies suicidal ideation at this time.  Also denies homicidal ideation.     Physical Exam   Triage Vital Signs: ED Triage Vitals  Encounter Vitals Group     BP 04/28/24 1349 (!) 142/104     Girls Systolic BP Percentile --      Girls Diastolic BP Percentile --      Boys Systolic BP Percentile --      Boys Diastolic BP Percentile --      Pulse Rate 04/28/24 1349 86     Resp 04/28/24 1349 20     Temp 04/28/24 1349 98.1 F (36.7 C)     Temp src --      SpO2 04/28/24 1349 98 %     Weight 04/28/24 1350 175 lb (79.4 kg)     Height 04/28/24 1350 5' 6 (1.676 m)     Head Circumference --      Peak Flow --      Pain Score 04/28/24 1350 0     Pain Loc --      Pain Education --      Exclude from Growth Chart --     Most recent vital signs: Vitals:   04/28/24 1349  BP: (!) 142/104  Pulse: 86  Resp: 20  Temp: 98.1 F (36.7 C)  SpO2: 98%     General: Awake, no distress.  CV:  Good peripheral perfusion.  Resp:  Normal effort.  Abd:  No distention.  Other:  Normal affect, soft-spoken, does not appear to be  responding to internal stimuli, calm and cooperative   ED Results / Procedures / Treatments   Labs (all labs ordered are listed, but only abnormal results are displayed) Labs Reviewed  COMPREHENSIVE METABOLIC PANEL WITH GFR - Abnormal; Notable for the following components:      Result Value   Glucose, Bld 106 (*)    All other components within normal limits  CBC - Abnormal; Notable for the following components:   RBC 3.79 (*)    MCV 100.8 (*)    All other components within normal limits  ETHANOL  URINE DRUG SCREEN     EKG     RADIOLOGY     PROCEDURES:  Critical Care performed: No  Procedures   MEDICATIONS ORDERED IN ED: Medications - No data to display   IMPRESSION / MDM / ASSESSMENT AND PLAN / ED COURSE  I reviewed the triage vital signs and the nursing notes.  Differential diagnosis includes, but is not limited to, major depressive disorder, substance abuse, electrolyte abnormalities  Patient's presentation is most consistent with acute presentation with potential threat to life or bodily function.  The patient has been placed in psychiatric observation due to the need to provide a safe environment for the patient while obtaining psychiatric consultation and evaluation, as well as ongoing medical and medication management to treat the patient's condition.  The patient has not been placed under full IVC at this time.   ----------------------------------------- 3:17 PM on 04/28/2024 ----------------------------------------- Lab work is overall unremarkable.  Patient medically cleared at this time.  Pending evaluation by psychiatry.     FINAL CLINICAL IMPRESSION(S) / ED DIAGNOSES   Final diagnoses:  Recurrent major depressive disorder, remission status unspecified     Rx / DC Orders   ED Discharge Orders     None        Note:  This document was prepared using Dragon voice recognition software and may include  unintentional dictation errors.   Rexford Reche HERO, MD 04/28/24 [phone]

## 2024-04-28 NOTE — ED Notes (Signed)
 Belongings   One ring Black scrunchie (hair tie) Grey coat Green Civil Engineer, [employer]ing  Black pants  Grey leggings Black shoes White socks /blue sock Blue underwear  Black purse Green wallet(pink Debit card) ($5cash) Medications Insurance Underwriter keys Paperwork Comb blue

## 2024-04-28 NOTE — ED Triage Notes (Signed)
 Pt to ED for depression, anxiety. Very tearful in triage, soft spoken. Reports feeling intermittent suicidal but is christian and knows should not commit suicide.  Reports has been off depression meds for awhile.

## 2024-04-28 NOTE — ED Provider Notes (Signed)
-----------------------------------------   5:45 PM on 04/28/2024 -----------------------------------------  I consulted and discussed the case with Dr. Ruther from psychiatry who evaluated the patient.  He recommends restarting the patient on Wellbutrin  and Zoloft .  I have prescribed these according to the doses he recommended.  He advised patient has outpatient psychiatry follow-up.  He also obtained collateral from the patient's daughter.  At this time, the patient is stable for discharge.  Lab workup was reviewed and is unremarkable.  Return precautions have been provided.   Jacolyn Pae, MD 04/28/24 1746

## 2024-04-28 NOTE — Discharge Instructions (Signed)
 Follow-up with your outpatient psychiatrist.  Resume taking the Wellbutrin  and Zoloft  as prescribed.  Return to the ER for any new or worsening symptoms that concern you especially any thoughts of wanting to harm yourself or others.

## 2024-04-28 NOTE — BH Assessment (Signed)
 Comprehensive Clinical Assessment (CCA) Screening, Triage and Referral Note  04/28/2024 Anna Romero [id number]  Anna Romero, 73 year old female who presents to [facility] ED voluntarily for treatment. Per triage note, Pt to ED for depression, anxiety. Very tearful in triage, soft spoken. Reports feeling intermittent suicidal but is Christian and knows should not commit suicide. Reports has been off depression meds for awhile.   During TTS assessment, pt presents alert and oriented x 4, restless but cooperative, and mood-congruent with affect. The pt does not appear to be responding to internal or external stimuli. Neither is the pt presenting with any delusional thinking. Pt verified the information provided to triage RN.   Pt identifies her main complaint to be "I was feeling down in the dumps." Patient reports she has been stressed with her current health issues and her marriage. Patient says she has upcoming lab work and preparation for colonoscopy on 05/06/24. Patient also mentioned that her husband has Bipolar and the two have not been getting along lately. Patient reports she has been off her psych meds for 6 months and her next appointment with her psychiatrist, Dr. Rollo Romero is not until January 2026. Patient denies using any illicit substances or alcohol. Patient denies having any prior suicidal attempts. Pt reports no prior INPT hx. Pt reports a medical hx of heart issues. Pt denies current SI/HI/AH/VH. Patient came to the ED because she wants to get started back on her psych meds.    Dispo pending.  Chief Complaint:  Chief Complaint  Patient presents with   Depression   Visit Diagnosis: Depression  Patient Reported Information How did you hear about us ? Self  What Is the Reason for Your Visit/Call Today? Patient came to ED due to feeling depressed.  How Long Has This Been Causing You Problems? 1-6 months  What Do You Feel Would Help You the Most Today? Treatment for  Depression or other mood problem; Stress Management; Medication(s)   Have You Recently Had Any Thoughts About Hurting Yourself? Yes  Are You Planning to Commit Suicide/Harm Yourself At This time? No   Have you Recently Had Thoughts About Hurting Someone Anna Romero? No  Are You Planning to Harm Someone at This Time? No  Explanation: No data recorded  Have You Used Any Alcohol or Drugs in the Past 24 Hours? No  How Long Ago Did You Use Drugs or Alcohol? No data recorded What Did You Use and How Much? No data recorded  Do You Currently Have a Therapist/Psychiatrist? Yes  Name of Therapist/Psychiatrist: Rollo Romero- [place] Romero   Have You Been Recently Discharged From Any Office Practice or Programs? No  Explanation of Discharge From Practice/Program: No data recorded   CCA Screening Triage Referral Assessment Type of Contact: Face-to-Face  Telemedicine Service Delivery:   Is this Initial or Reassessment?   Date Telepsych consult ordered in [facility]:    Time Telepsych consult ordered in [facility]:    Location of Assessment: [facility] ED  Provider Location: [facility] ED    Collateral Involvement: None provided   Does Patient Have a Court Appointed Legal Guardian? No data recorded Name and Contact of Legal Guardian: No data recorded If Minor and Not Living with Parent(s), Who has Custody? No data recorded Is CPS involved or ever been involved? No data recorded Is APS involved or ever been involved? No data recorded  Patient Determined To Be At Risk for Harm To Self or Others Based on Review of Patient Reported Information or Presenting Complaint?  No  Method: No Plan  Availability of Means: No access or NA  Intent: Vague intent or NA  Notification Required: No need or identified person  Additional Information for Danger to Others Potential: No data recorded Additional Comments for Danger to Others Potential: No data recorded Are There Guns or Other Weapons in Your Home?  No  Types of Guns/Weapons: No data recorded Are These Weapons Safely Secured?                            No data recorded Who Could Verify You Are Able To Have These Secured: No data recorded Do You Have any Outstanding Charges, Pending Court Dates, Parole/Probation? No data recorded Contacted To Inform of Risk of Harm To Self or Others: No data recorded  Does Patient Present under Involuntary Commitment? No    [place] of Residence: [place]   Patient Currently Receiving the Following Services: Individual Therapy; Medication Management   Determination of Need: Emergent (2 hours)   Options For Referral: ED Visit; Medication Management; Outpatient Therapy   Disposition Recommendation per psychiatric provider: Dispo pending  Anna Romero, Counselor, LCAS-A

## 2024-05-04 ENCOUNTER — Ambulatory Visit: Admission: RE | Admit: 2024-05-04 | Source: Home / Self Care

## 2024-05-04 HISTORY — DX: Bradycardia, unspecified: R00.1

## 2024-05-04 HISTORY — DX: Malignant neoplasm of uterus, part unspecified: C55

## 2024-05-04 SURGERY — COLONOSCOPY
Anesthesia: General

## 2024-06-05 ENCOUNTER — Ambulatory Visit: Admitting: Anesthesiology

## 2024-06-05 ENCOUNTER — Ambulatory Visit
Admission: RE | Admit: 2024-06-05 | Discharge: 2024-06-05 | Disposition: A | Discharge status: Home / Self Care | Attending: Gastroenterology | Admitting: Gastroenterology

## 2024-06-05 ENCOUNTER — Encounter: Payer: Self-pay | Admitting: Gastroenterology

## 2024-06-05 ENCOUNTER — Encounter: Admission: RE | Disposition: A | Payer: Self-pay | Source: Home / Self Care | Attending: Gastroenterology

## 2024-06-05 DIAGNOSIS — N189 Chronic kidney disease, unspecified: Secondary | ICD-10-CM | POA: Insufficient documentation

## 2024-06-05 DIAGNOSIS — K219 Gastro-esophageal reflux disease without esophagitis: Secondary | ICD-10-CM | POA: Insufficient documentation

## 2024-06-05 DIAGNOSIS — F419 Anxiety disorder, unspecified: Secondary | ICD-10-CM | POA: Diagnosis not present

## 2024-06-05 DIAGNOSIS — K641 Second degree hemorrhoids: Secondary | ICD-10-CM | POA: Diagnosis not present

## 2024-06-05 DIAGNOSIS — Z1211 Encounter for screening for malignant neoplasm of colon: Secondary | ICD-10-CM | POA: Insufficient documentation

## 2024-06-05 DIAGNOSIS — Z860101 Personal history of adenomatous and serrated colon polyps: Secondary | ICD-10-CM | POA: Insufficient documentation

## 2024-06-05 DIAGNOSIS — Z79899 Other long term (current) drug therapy: Secondary | ICD-10-CM | POA: Diagnosis not present

## 2024-06-05 DIAGNOSIS — I130 Hypertensive heart and chronic kidney disease with heart failure and stage 1 through stage 4 chronic kidney disease, or unspecified chronic kidney disease: Secondary | ICD-10-CM | POA: Insufficient documentation

## 2024-06-05 DIAGNOSIS — J4489 Other specified chronic obstructive pulmonary disease: Secondary | ICD-10-CM | POA: Insufficient documentation

## 2024-06-05 DIAGNOSIS — M1990 Unspecified osteoarthritis, unspecified site: Secondary | ICD-10-CM | POA: Insufficient documentation

## 2024-06-05 HISTORY — PX: COLONOSCOPY: SHX5424

## 2024-06-05 MED ORDER — LIDOCAINE HCL (CARDIAC) PF 100 MG/5ML IV SOSY
PREFILLED_SYRINGE | INTRAVENOUS | Status: DC | PRN
  Administered 2024-06-05: 60 mg via INTRAVENOUS

## 2024-06-05 MED ORDER — PROPOFOL 500 MG/50ML IV EMUL
INTRAVENOUS | Status: DC | PRN
  Administered 2024-06-05: 50 ug/kg/min via INTRAVENOUS

## 2024-06-05 MED ORDER — LIDOCAINE HCL (PF) 2 % IJ SOLN
INTRAMUSCULAR | Status: AC
  Filled 2024-06-05: qty 5

## 2024-06-05 MED ORDER — PROPOFOL 10 MG/ML IV BOLUS
INTRAVENOUS | Status: DC | PRN
  Administered 2024-06-05 (×2): 50 mg via INTRAVENOUS

## 2024-06-05 MED ORDER — SODIUM CHLORIDE 0.9 % IV SOLN
INTRAVENOUS | Status: DC
  Administered 2024-06-05: 20 mL/h via INTRAVENOUS

## 2024-06-05 NOTE — Transfer of Care (Signed)
 Immediate Anesthesia Transfer of Care Note  Patient: Anna Romero  Procedure(s) Performed: COLONOSCOPY  Patient Location: PACU  Anesthesia Type:General  Level of Consciousness: sedated  Airway & Oxygen Therapy: Patient Spontanous Breathing  Post-op Assessment: Report given to RN and Post -op Vital signs reviewed and stable  Post vital signs: Reviewed and stable  Last Vitals:  Vitals Value Taken Time  BP    Temp    Pulse    Resp    SpO2      Last Pain:  Vitals:   06/05/24 1232  TempSrc: Temporal  PainSc: 0-No pain         Complications: No notable events documented.

## 2024-06-05 NOTE — Interval H&P Note (Signed)
 History and Physical Interval Note:  06/05/2024 1:04 PM  Anna Romero  has presented today for surgery, with the diagnosis of History of adenomatous polyp of colon (Z86.0101).  The various methods of treatment have been discussed with the patient and family. After consideration of risks, benefits and other options for treatment, the patient has consented to  Procedures: COLONOSCOPY (N/A) as a surgical intervention.  The patient's history has been reviewed, patient examined, no change in status, stable for surgery.  I have reviewed the patient's chart and labs.  Questions were answered to the patient's satisfaction.     Ole ONEIDA Schick  Ok to proceed with colonoscopy

## 2024-06-05 NOTE — Op Note (Signed)
 [facility] Patient Name: Anna Romero Procedure Date: 06/05/2024 12:55 PM MRN: [id number] Account #: [id number] Date of Birth: Nov 24, 1950 Admit Type: Outpatient Age: 74 Room: [facility] ENDO ROOM 3 Gender: Female Note Status: Finalized Instrument Name: Colon Scope [id number] Procedure:             Colonoscopy Indications:           Surveillance: Piecemeal removal of large sessile                         adenoma last colonoscopy (< 3 yrs) Providers:             Ole Schick MD, MD Referring MD:          Lavenia Beaver, MD (Referring MD) Medicines:             Monitored Anesthesia Care Complications:         No immediate complications. Procedure:             Pre-Anesthesia Assessment:                        - Prior to the procedure, a History and Physical was                         performed, and patient medications and allergies were                         reviewed. The patient is competent. The risks and                         benefits of the procedure and the sedation options and                         risks were discussed with the patient. All questions                         were answered and informed consent was obtained.                         Patient identification and proposed procedure were                         verified by the physician, the nurse, the                         anesthesiologist, the anesthetist and the technician                         in the endoscopy suite. Mental Status Examination:                         alert and oriented. Airway Examination: normal                         oropharyngeal airway and neck mobility. Respiratory                         Examination: clear to auscultation. CV Examination:  normal. Prophylactic Antibiotics: The patient does not                         require prophylactic antibiotics. Prior                         Anticoagulants: The patient has taken no  anticoagulant                         or antiplatelet agents. ASA Grade Assessment: III - A                         patient with severe systemic disease. After reviewing                         the risks and benefits, the patient was deemed in                         satisfactory condition to undergo the procedure. The                         anesthesia plan was to use monitored anesthesia care                         (MAC). Immediately prior to administration of                         medications, the patient was re-assessed for adequacy                         to receive sedatives. The heart rate, respiratory                         rate, oxygen saturations, blood pressure, adequacy of                         pulmonary ventilation, and response to care were                         monitored throughout the procedure. The physical                         status of the patient was re-assessed after the                         procedure.                        After obtaining informed consent, the colonoscope was                         passed under direct vision. Throughout the procedure,                         the patient's blood pressure, pulse, and oxygen                         saturations were monitored continuously. The  Colonoscope was introduced through the anus and                         advanced to the the terminal ileum, with                         identification of the appendiceal orifice and IC                         valve. The colonoscopy was performed without                         difficulty. The patient tolerated the procedure well.                         The quality of the bowel preparation was adequate to                         identify polyps. The terminal ileum, ileocecal valve,                         appendiceal orifice, and rectum were photographed. Findings:      The perianal and digital rectal examinations were normal.      The  terminal ileum appeared normal.      A tattoo was seen in the ascending colon. The tattoo site appeared       normal.      Internal hemorrhoids were found during retroflexion. The hemorrhoids       were Grade II (internal hemorrhoids that prolapse but reduce       spontaneously).      The exam was otherwise without abnormality on direct and retroflexion       views. Impression:            - The examined portion of the ileum was normal.                        - A tattoo was seen in the ascending colon. The tattoo                         site appeared normal.                        - Internal hemorrhoids.                        - The examination was otherwise normal on direct and                         retroflexion views.                        - No specimens collected. Recommendation:        - Discharge patient to home.                        - Resume previous diet.                        - Continue present medications.                        -  Repeat colonoscopy is not recommended due to current                         age (73 years or older) for surveillance.                        - Return to referring physician as previously                         scheduled. Procedure Code(s):     --- Professional ---                        H9894, Colorectal cancer screening; colonoscopy on                         individual at high risk Diagnosis Code(s):     --- Professional ---                        Z86.010, Personal history of colonic polyps                        K64.1, Second degree hemorrhoids CPT copyright 2022 American Medical Association. All rights reserved. The codes documented in this report are preliminary and upon coder review may  be revised to meet current compliance requirements. Ole Schick MD, MD 06/05/2024 1:26:19 PM Number of Addenda: 0 Note Initiated On: 06/05/2024 12:55 PM Scope Withdrawal Time: 0 hours 5 minutes 5 seconds  Total Procedure Duration: 0 hours 9  minutes 15 seconds  Estimated Blood Loss:  Estimated blood loss: none.      [facility]

## 2024-06-05 NOTE — Anesthesia Preprocedure Evaluation (Addendum)
 "                                  Anesthesia Evaluation  Patient identified by MRN, date of birth, ID band Patient awake    Reviewed: Allergy & Precautions, NPO status , Patient's Chart, lab work & pertinent test results  Airway Mallampati: II  TM Distance: >3 FB Neck ROM: full    Dental  (+) Teeth Intact   Pulmonary neg pulmonary ROS, asthma  Restrictive lung disease   Pulmonary exam normal  + decreased breath sounds      Cardiovascular Exercise Tolerance: Poor hypertension, Pt. on medications +CHF  Normal cardiovascular exam Rhythm:Regular Rate:Normal     Neuro/Psych   Anxiety     negative neurological ROS  negative psych ROS   GI/Hepatic negative GI ROS, Neg liver ROS,GERD  Medicated,,  Endo/Other  negative endocrine ROS  Class 3 obesity  Renal/GU CRF and Renal InsufficiencyRenal disease  negative genitourinary   Musculoskeletal  (+) Arthritis ,    Abdominal  (+) + obese  Peds negative pediatric ROS (+)  Hematology negative hematology ROS (+)   Anesthesia Other Findings Past Medical History: No date: Anxiety No date: Asthma No date: Bradycardia No date: CHF (congestive heart failure) (HCC) No date: Chronic kidney disease     Comment:  Renal Insufficiency Stage 3 No date: Degenerative disc disease, cervical No date: Depression No date: Endometrial cancer (HCC) No date: GERD (gastroesophageal reflux disease) No date: Hyperlipidemia No date: Hypertension No date: Mixed restrictive and obstructive lung disease (HCC)     Comment:  Predominantly restriction due to chronic volume loss in               the left lung.  Mild small airways disease. No date: Moderate mitral insufficiency No date: Monoclonal gammopathy No date: Polymyositis (HCC) No date: Pulmonary hypertension (HCC) No date: Uterine cancer (HCC)  Past Surgical History: No date: ABDOMINAL HYSTERECTOMY No date: APPENDECTOMY 1990: BREAST EXCISIONAL BIOPSY; Left     Comment:   negative No date: CATARACT EXTRACTION 08/27/2016: COLONOSCOPY; N/A     Comment:  Procedure: COLONOSCOPY;  Surgeon: Gladis RAYMOND Mariner, MD;              Location: [facility] ENDOSCOPY;  Service: Endoscopy;                Laterality: N/A; 07/22/2018: COLONOSCOPY WITH PROPOFOL ; N/A     Comment:  Procedure: COLONOSCOPY WITH PROPOFOL ;  Surgeon:               Mariner Gladis RAYMOND, MD;  Location: [facility] ENDOSCOPY;                Service: Endoscopy;  Laterality: N/A; 10/05/2022: COLONOSCOPY WITH PROPOFOL ; N/A     Comment:  Procedure: COLONOSCOPY WITH PROPOFOL ;  Surgeon:               Maryruth Ole DASEN, MD;  Location: [facility] ENDOSCOPY;                Service: Endoscopy;  Laterality: N/A;  PREFERS PM No date: DILATION AND CURETTAGE OF UTERUS 10/05/2022: ESOPHAGOGASTRODUODENOSCOPY (EGD) WITH PROPOFOL ; N/A     Comment:  Procedure: ESOPHAGOGASTRODUODENOSCOPY (EGD) WITH               PROPOFOL ;  Surgeon: Maryruth Ole DASEN, MD;  Location:               [facility]  ENDOSCOPY;  Service: Endoscopy;  Laterality: N/A; No date: EYE SURGERY 10/12/2019: HYSTEROSCOPY WITH D & C; N/A     Comment:  Procedure: DILATATION AND CURETTAGE /HYSTEROSCOPY,               POLYPECTOMY OR MYOMECTOMY;  Surgeon: Verdon Keen,               MD;  Location: [facility] ORS;  Service: Gynecology;                Laterality: N/A; No date: JOINT REPLACEMENT No date: REPLACEMENT TOTAL HIP W/  RESURFACING IMPLANTS; Right No date: THYMECTOMY  BMI    Body Mass Index: 29.19 kg/m      Reproductive/Obstetrics negative OB ROS                              Anesthesia Physical Anesthesia Plan  ASA: 3  Anesthesia Plan: General   Post-op Pain Management:    Induction: Intravenous  PONV Risk Score and Plan: Propofol  infusion and TIVA  Airway Management Planned: Natural Airway and Nasal Cannula  Additional Equipment:   Intra-op Plan:   Post-operative Plan:   Informed Consent: I have reviewed the patients History  and Physical, chart, labs and discussed the procedure including the risks, benefits and alternatives for the proposed anesthesia with the patient or authorized representative who has indicated his/her understanding and acceptance.     Dental Advisory Given  Plan Discussed with: CRNA  Anesthesia Plan Comments:          Anesthesia Quick Evaluation  "

## 2024-06-05 NOTE — H&P (Signed)
 Outpatient short stay form Pre-procedure 06/05/2024  Ole ONEIDA Schick, MD  Primary Physician: Sherial Bail, MD  Reason for visit:  Surveillance  History of present illness:    74 y/o lady with history of CKD, hypertension, and uterine cancer here for surveillance colonoscopy. Last colonoscopy in 2024 with residual polyp at tattoo site. Denied hysterectomy to me. No known family history of GI malignancies. No blood thinners.    Current Medications[1]  Medications Prior to Admission  Medication Sig Dispense Refill Last Dose/Taking   acetaminophen (TYLENOL) 500 MG tablet Take 1,000 mg by mouth every 8 (eight) hours as needed for moderate pain. Needs daily   Past Week   albuterol  (VENTOLIN  HFA) 108 (90 Base) MCG/ACT inhaler Inhale 1-2 puffs into the lungs every 6 (six) hours as needed for wheezing or shortness of breath. 8 g 1 06/05/2024 at  6:00 AM   buPROPion  (WELLBUTRIN  XL) 150 MG 24 hr tablet Take 1 tablet (150 mg total) by mouth every morning. 90 tablet 3 06/05/2024 at  6:00 AM   buPROPion  ER (WELLBUTRIN  SR) 100 MG 12 hr tablet Take 100 mg by mouth 2 (two) times daily.   06/05/2024 at  6:00 AM   carvedilol (COREG) 3.125 MG tablet Take 1 tablet by mouth 2 (two) times daily with a meal.   06/05/2024 at  6:00 AM   furosemide (LASIX) 20 MG tablet Take 20 mg by mouth daily as needed for fluid. Takes every few days   06/05/2024 at  6:00 AM   omeprazole (PRILOSEC) 40 MG capsule Take 40 mg by mouth daily.   06/05/2024 at  6:00 AM   sertraline  (ZOLOFT ) 50 MG tablet Take 1 tablet (50 mg total) by mouth daily. 90 tablet 3 06/05/2024 at  6:00 AM   spironolactone (ALDACTONE) 25 MG tablet Take 25 mg by mouth daily.   06/05/2024 at  6:00 AM   telmisartan (MICARDIS) 40 MG tablet Take 40 mg by mouth daily.   06/05/2024 at  6:00 AM   buPROPion  (WELLBUTRIN  SR) 200 MG 12 hr tablet Take 200 mg by mouth every morning.  (Patient not taking: Reported on 11/05/2023)      clonazePAM (KLONOPIN) 0.5 MG tablet Take 0.5  mg by mouth 2 (two) times daily as needed for anxiety. (Patient not taking: Reported on 11/05/2023)      Fluticasone-Umeclidin-Vilant (TRELEGY ELLIPTA ) 100-62.5-25 MCG/ACT AEPB Inhale 100 mcg into the lungs daily. (Patient not taking: Reported on 11/05/2023) 28 each 0    memantine (NAMENDA) 10 MG tablet Take 5 mg by mouth 2 (two) times daily. (Patient not taking: Reported on 11/05/2023)      Multiple Vitamins-Minerals (MULTIVITAMIN WITH MINERALS) tablet Take 1 tablet by mouth daily. (Patient not taking: Reported on 04/28/2024)      sertraline  (ZOLOFT ) 50 MG tablet SMARTSIG:1 Tablet(s) By Mouth Every Evening (Patient not taking: Reported on 11/05/2023)      Suvorexant (BELSOMRA PO) Take 10 mg by mouth every evening. Takes a few nights per week (Patient not taking: Reported on 04/28/2024)      tiZANidine (ZANAFLEX) 2 MG tablet Take 1 mg by mouth at bedtime. (Patient not taking: Reported on 11/05/2023)        Allergies[2]   Past Medical History:  Diagnosis Date   Anxiety    Asthma    Bradycardia    CHF (congestive heart failure) (HCC)    Chronic kidney disease    Renal Insufficiency Stage 3   Degenerative disc disease, cervical    Depression  Endometrial cancer (HCC)    GERD (gastroesophageal reflux disease)    Hyperlipidemia    Hypertension    Mixed restrictive and obstructive lung disease (HCC)    Predominantly restriction due to chronic volume loss in the left lung.  Mild small airways disease.   Moderate mitral insufficiency    Monoclonal gammopathy    Polymyositis (HCC)    Pulmonary hypertension (HCC)    Uterine cancer (HCC)     Review of systems:  Otherwise negative.    Physical Exam  Gen: Alert, oriented. Appears stated age.  HEENT: PERRLA. Lungs: No respiratory distress CV: RRR Abd: soft, benign, no masses Ext: No edema    Planned procedures: Proceed with colonoscopy. The patient understands the nature of the planned procedure, indications, risks, alternatives and  potential complications including but not limited to bleeding, infection, perforation, damage to internal organs and possible oversedation/side effects from anesthesia. The patient agrees and gives consent to proceed.  Please refer to procedure notes for findings, recommendations and patient disposition/instructions.     Ole ONEIDA Schick, MD [facility]         [1]  Current Facility-Administered Medications:    0.9 %  sodium chloride  infusion, , Intravenous, Continuous, Dawit Tankard, Ole ONEIDA, MD, Last Rate: 20 mL/hr at 06/05/24 1250, 20 mL/hr at 06/05/24 1250 [2]  Allergies Allergen Reactions   Hctz [Hydrochlorothiazide] Other (See Comments)    Kidney Disorder

## 2024-06-05 NOTE — Anesthesia Postprocedure Evaluation (Signed)
"   Anesthesia Post Note  Patient: Anna Romero  Procedure(s) Performed: COLONOSCOPY  Patient location during evaluation: PACU Anesthesia Type: General Level of consciousness: awake Vital Signs Assessment: post-procedure vital signs reviewed and stable Respiratory status: spontaneous breathing Cardiovascular status: stable Anesthetic complications: no   No notable events documented.   Last Vitals:  Vitals:   06/05/24 1327 06/05/24 1335  BP: (!) 92/36 (!) 99/56  Pulse: (!) 55 60  Resp: 19 (!) 21  Temp:    SpO2: 100% 100%    Last Pain:  Vitals:   06/05/24 1335  TempSrc:   PainSc: 0-No pain                 VAN STAVEREN,Christyana Corwin      "

## 2024-09-23 ENCOUNTER — Ambulatory Visit
